# Patient Record
Sex: Female | Born: 1955 | Race: Asian | Hispanic: No | Marital: Married | Smoking: Never smoker
Health system: Southern US, Community
[De-identification: ages and names within clinical notes are randomized; demographics above are authoritative.]

## PROBLEM LIST (undated history)

## (undated) DIAGNOSIS — I82509 Chronic embolism and thrombosis of unspecified deep veins of unspecified lower extremity: Secondary | ICD-10-CM

## (undated) DIAGNOSIS — A159 Respiratory tuberculosis unspecified: Secondary | ICD-10-CM

## (undated) DIAGNOSIS — I209 Angina pectoris, unspecified: Secondary | ICD-10-CM

## (undated) DIAGNOSIS — G8929 Other chronic pain: Secondary | ICD-10-CM

## (undated) DIAGNOSIS — R0602 Shortness of breath: Secondary | ICD-10-CM

## (undated) DIAGNOSIS — I10 Essential (primary) hypertension: Secondary | ICD-10-CM

---

## 2007-10-29 ENCOUNTER — Ambulatory Visit: Payer: Self-pay | Admitting: Nurse Practitioner

## 2007-10-29 DIAGNOSIS — N926 Irregular menstruation, unspecified: Secondary | ICD-10-CM

## 2007-10-29 DIAGNOSIS — G43909 Migraine, unspecified, not intractable, without status migrainosus: Secondary | ICD-10-CM | POA: Insufficient documentation

## 2007-10-29 DIAGNOSIS — M545 Low back pain: Secondary | ICD-10-CM

## 2008-02-17 ENCOUNTER — Emergency Department (HOSPITAL_COMMUNITY): Admission: EM | Admit: 2008-02-17 | Discharge: 2008-02-17 | Payer: Self-pay | Admitting: Emergency Medicine

## 2008-04-28 ENCOUNTER — Emergency Department (HOSPITAL_COMMUNITY): Admission: EM | Admit: 2008-04-28 | Discharge: 2008-04-28 | Payer: Self-pay | Admitting: Emergency Medicine

## 2008-05-01 ENCOUNTER — Emergency Department (HOSPITAL_COMMUNITY): Admission: EM | Admit: 2008-05-01 | Discharge: 2008-05-01 | Payer: Self-pay | Admitting: Family Medicine

## 2008-08-22 ENCOUNTER — Ambulatory Visit: Payer: Self-pay | Admitting: Nurse Practitioner

## 2008-08-22 DIAGNOSIS — I10 Essential (primary) hypertension: Secondary | ICD-10-CM | POA: Insufficient documentation

## 2008-08-22 DIAGNOSIS — R3129 Other microscopic hematuria: Secondary | ICD-10-CM

## 2008-08-22 LAB — CONVERTED CEMR LAB
ALT: 18 units/L (ref 0–35)
AST: 18 units/L (ref 0–37)
Albumin: 4.1 g/dL (ref 3.5–5.2)
Alkaline Phosphatase: 80 units/L (ref 39–117)
BUN: 11 mg/dL (ref 6–23)
Basophils Absolute: 0 10*3/uL (ref 0.0–0.1)
Basophils Relative: 1 % (ref 0–1)
CO2: 23 meq/L (ref 19–32)
Calcium: 9.1 mg/dL (ref 8.4–10.5)
Chloride: 102 meq/L (ref 96–112)
Creatinine, Ser: 0.56 mg/dL (ref 0.40–1.20)
Eosinophils Absolute: 0.1 10*3/uL (ref 0.0–0.7)
Eosinophils Relative: 1 % (ref 0–5)
Glucose, Bld: 81 mg/dL (ref 70–99)
HCT: 42 % (ref 36.0–46.0)
Hemoglobin: 13.4 g/dL (ref 12.0–15.0)
Lymphocytes Relative: 33 % (ref 12–46)
Lymphs Abs: 1.4 10*3/uL (ref 0.7–4.0)
MCHC: 31.9 g/dL (ref 30.0–36.0)
MCV: 91.1 fL (ref 78.0–100.0)
Monocytes Absolute: 0.3 10*3/uL (ref 0.1–1.0)
Monocytes Relative: 7 % (ref 3–12)
Neutro Abs: 2.5 10*3/uL (ref 1.7–7.7)
Neutrophils Relative %: 58 % (ref 43–77)
Platelets: 263 10*3/uL (ref 150–400)
Potassium: 4.5 meq/L (ref 3.5–5.3)
RBC: 4.61 M/uL (ref 3.87–5.11)
RDW: 13.3 % (ref 11.5–15.5)
Sodium: 137 meq/L (ref 135–145)
Total Bilirubin: 0.7 mg/dL (ref 0.3–1.2)
Total Protein: 7.8 g/dL (ref 6.0–8.3)
WBC: 4.3 10*3/uL (ref 4.0–10.5)

## 2008-08-23 ENCOUNTER — Encounter (INDEPENDENT_AMBULATORY_CARE_PROVIDER_SITE_OTHER): Payer: Self-pay | Admitting: Nurse Practitioner

## 2008-09-22 ENCOUNTER — Ambulatory Visit: Payer: Self-pay | Admitting: Nurse Practitioner

## 2008-11-03 DIAGNOSIS — I82409 Acute embolism and thrombosis of unspecified deep veins of unspecified lower extremity: Secondary | ICD-10-CM | POA: Insufficient documentation

## 2008-11-09 ENCOUNTER — Encounter (INDEPENDENT_AMBULATORY_CARE_PROVIDER_SITE_OTHER): Payer: Self-pay | Admitting: Nurse Practitioner

## 2008-11-13 ENCOUNTER — Emergency Department (HOSPITAL_COMMUNITY): Admission: EM | Admit: 2008-11-13 | Discharge: 2008-11-13 | Payer: Self-pay | Admitting: Emergency Medicine

## 2008-11-13 ENCOUNTER — Ambulatory Visit: Payer: Self-pay | Admitting: Nurse Practitioner

## 2008-11-13 LAB — CONVERTED CEMR LAB
AST: 30 units/L
Albumin: 3.2 g/dL
Alkaline Phosphatase: 88 units/L
CO2: 25 meq/L
Chloride: 97 meq/L
Eosinophils Absolute: 0.1 10*3/uL
Eosinophils Relative: 1 %
Glucose, Bld: 110 mg/dL
HCT: 34.2 %
INR: 4.7
INR: 4.8 — ABNORMAL HIGH (ref 0.0–1.5)
Lymphocytes Relative: 10 %
Lymphs Abs: 1 10*3/uL
MCV: 88.4 fL
Monocytes Relative: 12 %
Neutrophils Relative %: 77 %
Platelets: 292 10*3/uL
RBC: 3.86 M/uL
Sodium: 131 meq/L
Total Protein: 7.6 g/dL
WBC: 9.8 10*3/uL

## 2008-11-15 ENCOUNTER — Ambulatory Visit: Payer: Self-pay | Admitting: Nurse Practitioner

## 2008-11-22 ENCOUNTER — Ambulatory Visit: Payer: Self-pay | Admitting: Nurse Practitioner

## 2008-11-22 LAB — CONVERTED CEMR LAB: Prothrombin Time: 37.8 s — ABNORMAL HIGH (ref 11.6–15.2)

## 2008-11-24 ENCOUNTER — Ambulatory Visit: Payer: Self-pay | Admitting: Nurse Practitioner

## 2008-12-01 ENCOUNTER — Ambulatory Visit: Payer: Self-pay | Admitting: Nurse Practitioner

## 2008-12-04 ENCOUNTER — Ambulatory Visit: Payer: Self-pay | Admitting: Nurse Practitioner

## 2008-12-12 ENCOUNTER — Encounter (INDEPENDENT_AMBULATORY_CARE_PROVIDER_SITE_OTHER): Payer: Self-pay | Admitting: Nurse Practitioner

## 2008-12-20 ENCOUNTER — Telehealth (INDEPENDENT_AMBULATORY_CARE_PROVIDER_SITE_OTHER): Payer: Self-pay | Admitting: *Deleted

## 2008-12-29 ENCOUNTER — Ambulatory Visit: Payer: Self-pay | Admitting: Nurse Practitioner

## 2009-01-01 ENCOUNTER — Encounter: Payer: Self-pay | Admitting: Nurse Practitioner

## 2009-01-01 LAB — CONVERTED CEMR LAB: INR: 1.5 (ref 0.0–1.5)

## 2009-01-18 ENCOUNTER — Encounter (INDEPENDENT_AMBULATORY_CARE_PROVIDER_SITE_OTHER): Payer: Self-pay | Admitting: Nurse Practitioner

## 2009-11-27 ENCOUNTER — Encounter (INDEPENDENT_AMBULATORY_CARE_PROVIDER_SITE_OTHER): Payer: Self-pay | Admitting: Nurse Practitioner

## 2009-12-25 LAB — CONVERTED CEMR LAB

## 2010-02-07 ENCOUNTER — Encounter (INDEPENDENT_AMBULATORY_CARE_PROVIDER_SITE_OTHER): Payer: Self-pay | Admitting: Nurse Practitioner

## 2010-02-25 ENCOUNTER — Emergency Department (HOSPITAL_COMMUNITY): Admission: EM | Admit: 2010-02-25 | Discharge: 2010-02-25 | Payer: Self-pay | Admitting: Family Medicine

## 2010-04-03 ENCOUNTER — Ambulatory Visit: Payer: Self-pay | Admitting: Nurse Practitioner

## 2010-04-04 ENCOUNTER — Encounter (INDEPENDENT_AMBULATORY_CARE_PROVIDER_SITE_OTHER): Payer: Self-pay | Admitting: Nurse Practitioner

## 2010-04-04 LAB — CONVERTED CEMR LAB
AST: 27 units/L (ref 0–37)
Albumin: 4.2 g/dL (ref 3.5–5.2)
BUN: 10 mg/dL (ref 6–23)
CO2: 22 meq/L (ref 19–32)
Calcium: 8.9 mg/dL (ref 8.4–10.5)
Chloride: 104 meq/L (ref 96–112)
Cholesterol: 175 mg/dL (ref 0–200)
Eosinophils Relative: 1 % (ref 0–5)
HCT: 38.6 % (ref 36.0–46.0)
HDL: 56 mg/dL (ref >39)
Hemoglobin: 12.4 g/dL (ref 12.0–15.0)
Lymphocytes Relative: 26 % (ref 12–46)
Lymphs Abs: 1.3 10*3/uL (ref 0.7–4.0)
MCV: 87.7 fL (ref 78.0–100.0)
Monocytes Absolute: 0.4 10*3/uL (ref 0.1–1.0)
Monocytes Relative: 7 % (ref 3–12)
Platelets: 196 10*3/uL (ref 150–400)
Potassium: 4.2 meq/L (ref 3.5–5.3)
Prothrombin Time: 48.9 s — ABNORMAL HIGH (ref 11.6–15.2)
RBC: 4.4 M/uL (ref 3.87–5.11)
TSH: 0.365 microintl units/mL (ref 0.350–4.500)
WBC: 5 10*3/uL (ref 4.0–10.5)

## 2010-04-10 ENCOUNTER — Encounter: Payer: Self-pay | Admitting: Nurse Practitioner

## 2010-04-10 ENCOUNTER — Encounter (INDEPENDENT_AMBULATORY_CARE_PROVIDER_SITE_OTHER): Payer: Self-pay | Admitting: Nurse Practitioner

## 2010-04-10 ENCOUNTER — Ambulatory Visit: Payer: Self-pay | Admitting: Nurse Practitioner

## 2010-04-10 LAB — CONVERTED CEMR LAB: INR: 2.01 — ABNORMAL HIGH (ref <1.50)

## 2010-04-19 ENCOUNTER — Encounter (INDEPENDENT_AMBULATORY_CARE_PROVIDER_SITE_OTHER): Payer: Self-pay | Admitting: Nurse Practitioner

## 2010-04-24 ENCOUNTER — Ambulatory Visit: Payer: Self-pay | Admitting: Nurse Practitioner

## 2010-04-25 LAB — CONVERTED CEMR LAB: Prothrombin Time: 28.3 s — ABNORMAL HIGH (ref 11.6–15.2)

## 2010-05-21 ENCOUNTER — Ambulatory Visit: Payer: Self-pay | Admitting: Nurse Practitioner

## 2010-05-22 ENCOUNTER — Ambulatory Visit: Payer: Self-pay | Admitting: Nurse Practitioner

## 2010-05-22 LAB — CONVERTED CEMR LAB: INR: 2.86 — ABNORMAL HIGH (ref <1.50)

## 2010-05-23 IMAGING — CT CT ANGIO CHEST
2 of 6 series · 18 of 36 positions shown · IV contrast (100 ML OMNI 300)
Comparison: None

CLINICAL DATA: Left lower extremity swelling and redness,
intermittent chest pain, shortness of breath, and dyspnea, elevated
D-dimer, history hypertension, DVT, tuberculosis

CT ANGIOGRAPHY CHEST
TECHNIQUE: Multidetector CT imaging of the chest using the
standard protocol during bolus administration of intravenous
contrast. Multiplanar reconstructed images including MIPs were
obtained and reviewed to evaluate the vascular anatomy. Scattered
respiratory motion artifacts are present, patient unable to follow
instructions due to the language barrier.
Contrast: 100 ml [id number]

[Series 2: pe · axial · 0.81mm/px · z∈[-195,-8]mm · 17 of 169 slices shown]
[im 10/169  lung]
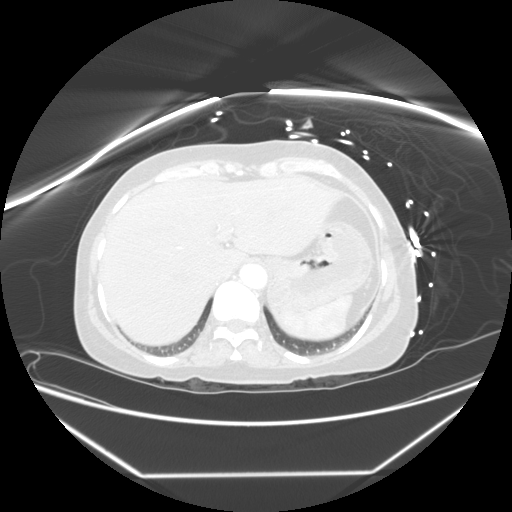
[im 19/169  mediastinal]
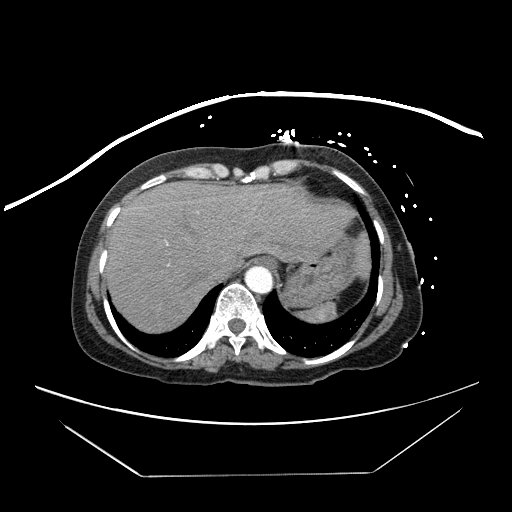
[im 29/169  lung]
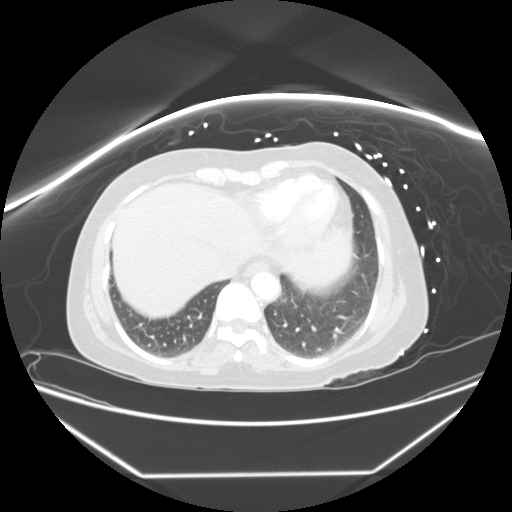
[im 38/169  mediastinal]
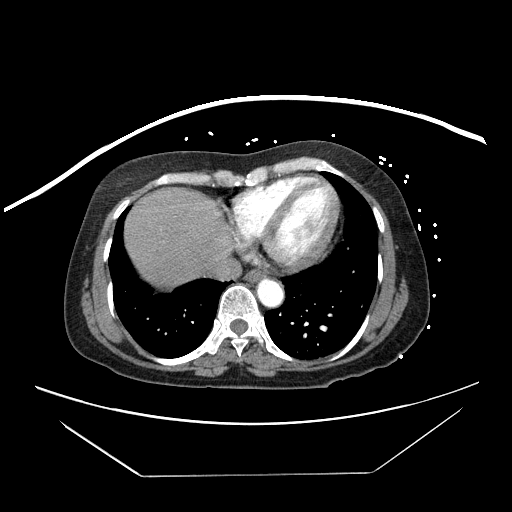
[im 47/169  lung]
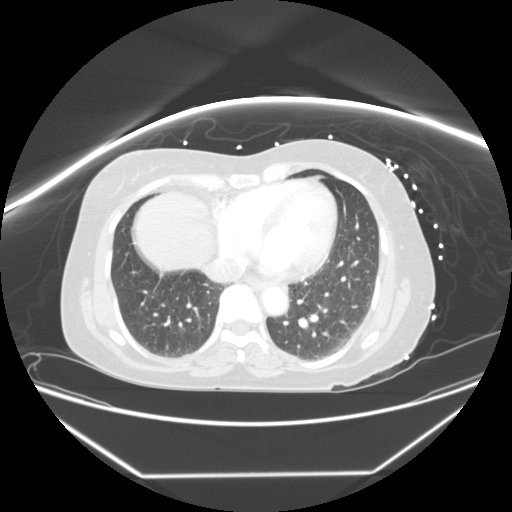
[im 57/169  mediastinal]
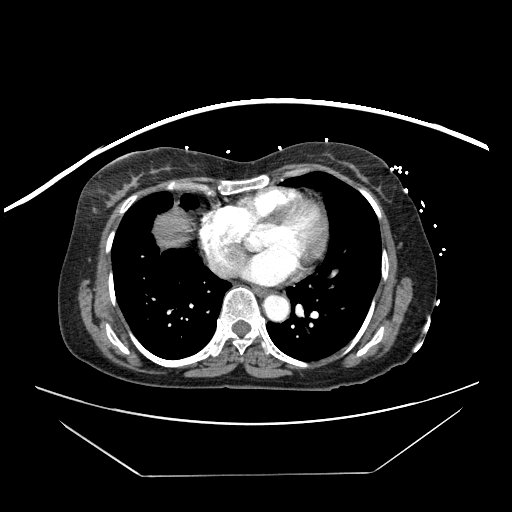
[im 66/169  lung]
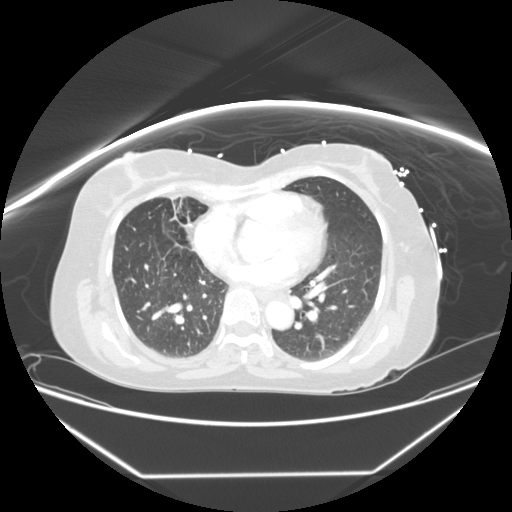
[im 75/169  mediastinal]
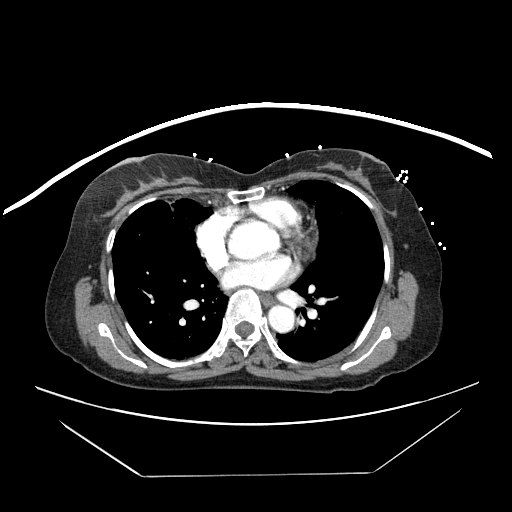
[im 85/169  lung]
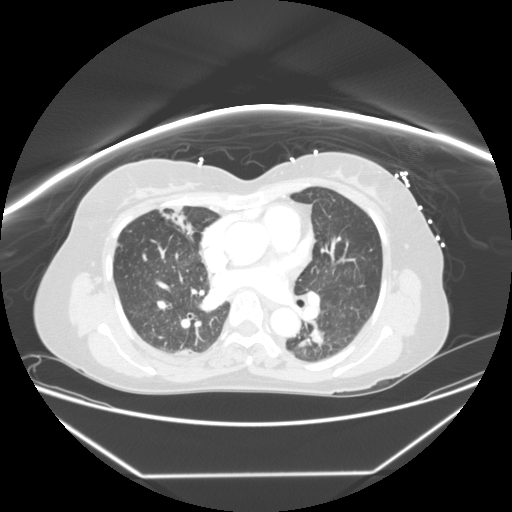
[im 94/169  mediastinal]
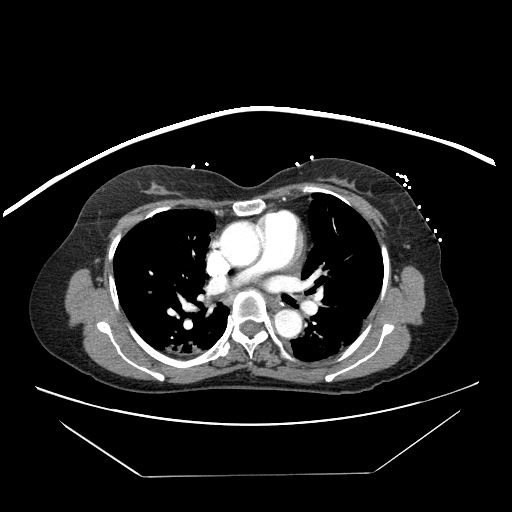
[im 103/169  lung]
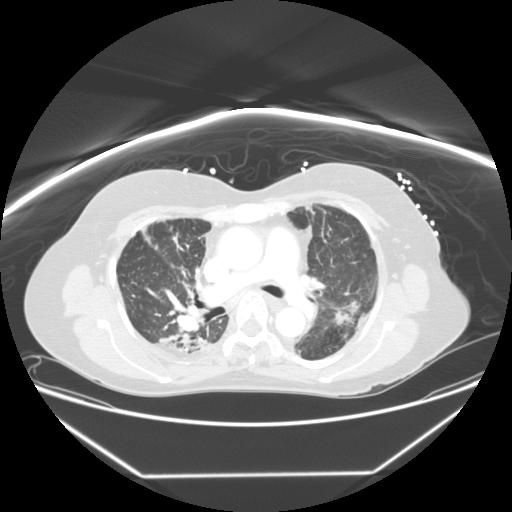
[im 113/169  mediastinal]
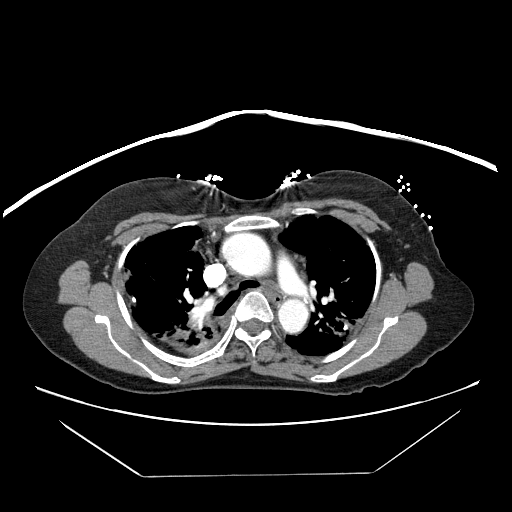
[im 122/169  lung]
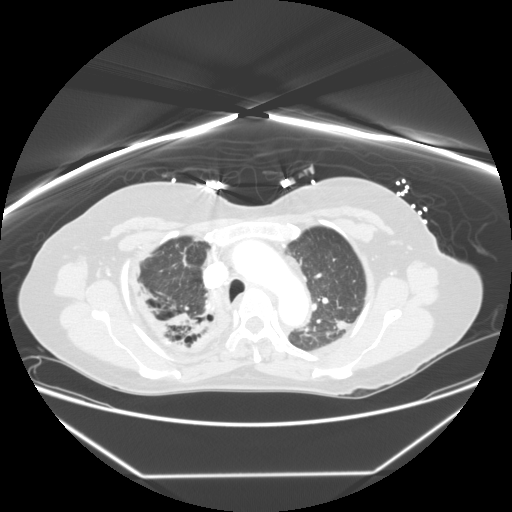
[im 131/169  mediastinal]
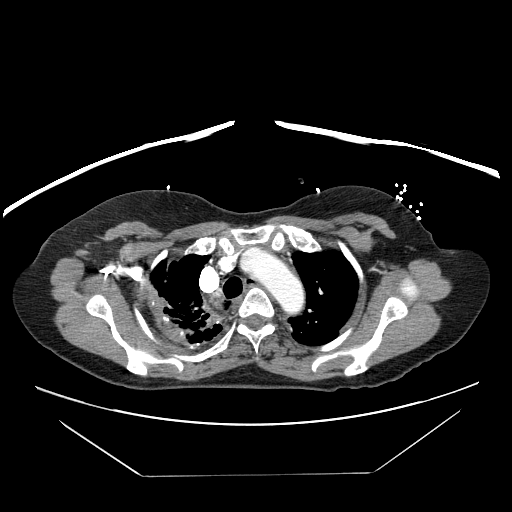
[im 141/169  lung]
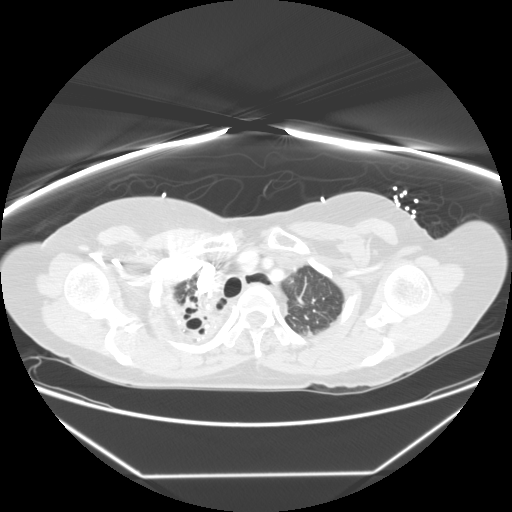
[im 150/169  mediastinal]
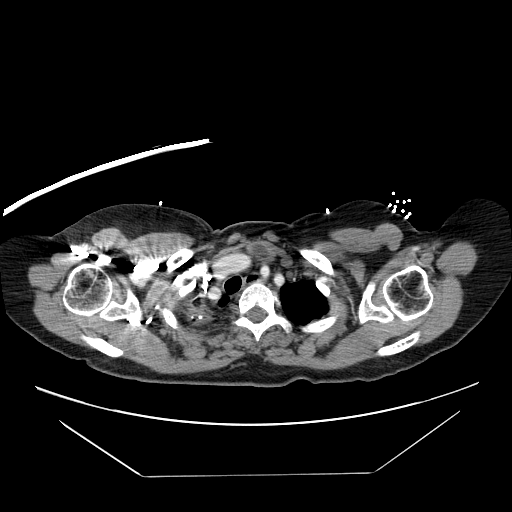
[im 159/169  lung]
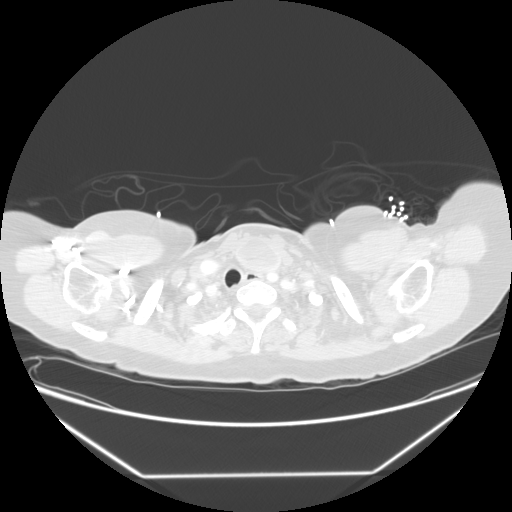

[Series 203: coronal · coronal · 0.81mm/px · 1 of 60 slices shown]
[im 30/60  mediastinal]
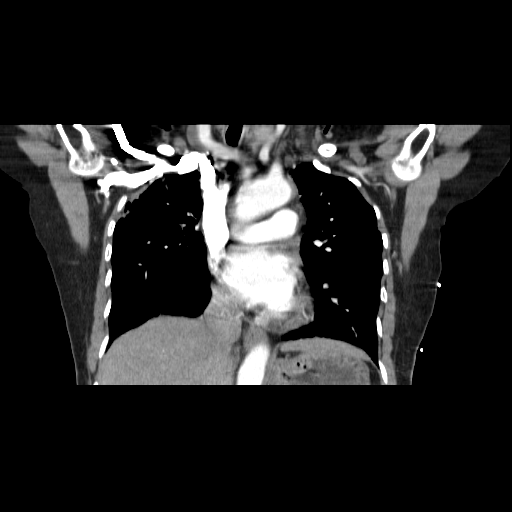

[18 of 36 positions shown; findings below may reference images not displayed]

FINDINGS: Low attenuation question cystic left thyroid mass, 3.6 x 2.7 by at
least 2.8 cm diameter, with mediastinal shift to the right.
Aorta normal caliber without aneurysm or dissection.
Pulmonary arteries patent.
No evidence of pulmonary embolism.
Numerous calcified right upper lobe pulmonary granulomata as well
as calcified hilar and mediastinal nodes compatible with given
history of tuberculosis.
Additional calcified granulomas identified within left upper lobe.
Visualized portion of upper abdomen unremarkable.
Right upper lobe volume loss with scattered cystic changes and
bronchiectasis.
Subpleural soft tissue masses in bilateral upper lobes, many of
which are calcified, suspect related to granulomatous disease.
Noncalcified pulmonary nodule right upper lobe 5 mm diameter image
39.
Additional noncalcified nodular foci in left upper lobe, including
8 mm diameter nodule 36.
Minimal scattered interlobular septal thickening in the mid-to-
lower lungs.
No segmental consolidation, pleural effusion, or pneumothorax.
No focal bony abnormalities.
IMPRESSION: No evidence of pulmonary embolism.
Extensive granulomatous disease throughout primarily right upper
lobe but also seen in left upper lobe with calcified mediastinal
and hilar lymph nodes bilaterally, compatible with given history of
tuberculosis.
Scattered parenchymal opacities and nodular foci throughout both
lungs, may be related to granulomatous disease and scarring though
unable to exclude tumor at any of the noncalcified sites, recommend
either comparison to prior outside studies or follow-up imaging
stability.
Cystic left thyroid mass at least 3.6 cm diameter, recommend follow-
up non emergent thyroid sonography to characterize this lesion.

## 2010-06-19 ENCOUNTER — Ambulatory Visit: Payer: Self-pay | Admitting: Internal Medicine

## 2010-06-19 ENCOUNTER — Encounter (INDEPENDENT_AMBULATORY_CARE_PROVIDER_SITE_OTHER): Payer: Self-pay | Admitting: Nurse Practitioner

## 2010-06-21 ENCOUNTER — Telehealth (INDEPENDENT_AMBULATORY_CARE_PROVIDER_SITE_OTHER): Payer: Self-pay | Admitting: Nurse Practitioner

## 2010-06-21 ENCOUNTER — Ambulatory Visit: Payer: Self-pay | Admitting: Internal Medicine

## 2010-07-18 ENCOUNTER — Ambulatory Visit: Payer: Self-pay | Admitting: Nurse Practitioner

## 2010-07-19 ENCOUNTER — Ambulatory Visit: Payer: Self-pay | Admitting: Nurse Practitioner

## 2010-07-19 LAB — CONVERTED CEMR LAB: INR: 2.61 — ABNORMAL HIGH (ref <1.50)

## 2010-08-15 ENCOUNTER — Ambulatory Visit: Payer: Self-pay | Admitting: Nurse Practitioner

## 2010-08-19 ENCOUNTER — Ambulatory Visit: Payer: Self-pay | Admitting: Nurse Practitioner

## 2010-08-19 LAB — CONVERTED CEMR LAB: Prothrombin Time: 14.1 s (ref 11.6–15.2)

## 2010-09-20 ENCOUNTER — Ambulatory Visit
Admission: RE | Admit: 2010-09-20 | Discharge: 2010-09-20 | Payer: Self-pay | Source: Home / Self Care | Attending: Nurse Practitioner | Admitting: Nurse Practitioner

## 2010-09-20 ENCOUNTER — Encounter (INDEPENDENT_AMBULATORY_CARE_PROVIDER_SITE_OTHER): Payer: Self-pay | Admitting: Nurse Practitioner

## 2010-10-15 NOTE — Assessment & Plan Note (Signed)
Summary: Lab results  Nurse Visit   Physical Exam  Extremities:  No warmth, redness or edema noted to either lower extremity.   History of Present Illness: States pain is better, but still has a little.  No heat or swelling noted to left leg.     Patient Instructions: 1)  Your labs are ok today. 2)  No need to restart meds (coumadin) 3)  Take your blood pressure medications as ordered 4)  Follow up in 1 month for D-Dimer.  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 if still negative she can stay off coumadin  Complete Medication List: 1)  Lisinopril 5 Mg Tabs (Lisinopril) .Marland Kitchen.. 1 tablet by mouth daily for blood pressure 2)  Ultram 50 Mg Tabs (Tramadol hcl) .Marland Kitchen.. 1 tablet by mouth two times a day as needed for pain 3)  Coumadin 5 Mg Tabs (Warfarin sodium) .... Hold   Allergies: No Known Drug Allergies  Orders Added: 1)  Est. Patient Level I [04540]

## 2010-10-15 NOTE — Assessment & Plan Note (Signed)
Summary: F/u for DVT   Vital Signs:  Patient profile:   55 year old female Weight:      126.4 pounds BMI:     24.17 Temp:     97.4 degrees F oral Pulse rate:   74 / minute Pulse rhythm:   regular Resp:     20 per minute BP sitting:   129 / 95  (left arm) Cuff size:   regular  Vitals Entered By: Levon Hedger (April 10, 2010 10:01 AM) CC: follow-up visit 1 week, Hypertension Management Is Patient Diabetic? No  Does patient need assistance? Functional Status Self care Ambulation Normal   CC:  follow-up visit 1 week and Hypertension Management.  History of Present Illness:  Pt into the office for 1 week f/u. She came back into on 04/05/2010 and labs reviewed with pt. She also brought her medications bottles into the office. She was taking coumadin 5mg  with alternating doses of 1 tablet and 1 1/2 tablet. INR >5 on last week and pt was instructed on the following 04/05/2010 - none 04/06/2010 - 5mg  04/07/2010 - 5mg  04/08/2010 - 5mg  04/09/2010 - 5mg   Pt presents today with her son who is interpreting for her. Once again pt did NOT bring her medication bottles with her.  Advised her to bring all medications with her to each visit. Some records faxed from [place] which shows that she had labs done to check for hereditary DVT and all negative. 11/03/2008 - CT angiogram of abdominal aorta and bilateral iliofemoral 1. Probably scarring in the anterior aspect of the right lung base 2.  Good runoff bilaterally to calf.  There is poor visualization of the trifurcations below this.  This may represent a limitation of this technique. 3.  Fluid in the endometrial cavity likely related to the pt's menstrual cycle 4.  Enlargment of the lower portion of the left rectus abdominis muscle to 6.3cm x 3.8 cm.  This may represent a rectus hematoma 5.  Thrombosis of the left iliac vein.  Anticoagulation Management History:      She is being anticoagulated due to the first episode of deep venous  thrombosis and/or pulmonary embolism.  Negative risk factors for bleeding include an age less than 35 years old, no history of CVA/TIA, no history of GI bleeding, and absence of serious comorbidities.  The bleeding index is 'low risk'.  Positive CHADS2 values include History of HTN.  Negative CHADS2 values include Age > 67 years old, History of Diabetes, and Prior Stroke/CVA/TIA.  Anticipated length of treatment is 3-6 months.  Her last INR was 5.40.    Hypertension History:      She denies headache, chest pain, and palpitations.  She notes no problems with any antihypertensive medication side effects.        Positive major cardiovascular risk factors include hypertension.  Negative major cardiovascular risk factors include female age less than 25 years old, no history of diabetes, and non-tobacco-user status.        Further assessment for target organ damage reveals no history of ASHD, cardiac end-organ damage (CHF/LVH), stroke/TIA, peripheral vascular disease, renal insufficiency, or hypertensive retinopathy.      Allergies: No Known Drug Allergies  Review of Systems CV:  Denies chest pain or discomfort. Resp:  Denies cough. GI:  Denies abdominal pain, nausea, and vomiting. MS:  Denies joint pain.  Physical Exam  General:  alert.   Ears:  ear piercing(s) noted.   Nose:  nasal piercing Lungs:  normal breath  sounds.   Heart:  normal rate and regular rhythm.   Abdomen:  normal bowel sounds.   Msk:  normal ROM.   Neurologic:  alert & oriented X3.   Skin:  color normal.   Psych:  Oriented X3.     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 1 tablet by mouth daily for blood pressure  Complete Medication List: 1)  Lisinopril 5 Mg Tabs (Lisinopril) .Marland Kitchen.. 1 tablet by mouth daily for blood pressure 2)  Ultram 50 Mg Tabs (Tramadol hcl) .Marland Kitchen.. 1 tablet by mouth two times a day as needed for pain 3)  Coumadin 5 Mg Tabs (Warfarin sodium) .... Sunday - 1 tab, monday - 1 tab, tuesday - 1 tab, wednesday - 1 tab, thursday - 1 tab, friday - 1 tab, saturday - 1 tab  Anticoagulation Management Assessment/Plan:      The patient's current anticoagulation dose is Coumadin 5 mg tabs: Sunday - 1 tab, Monday - 1 tab, Tuesday - 1 tab, Wednesday - 1 tab, Thursday - 1 tab, Friday - 1 tab, Saturday - 1 tab.  The target INR is 2.0-3.0.  The next INR is due PT/INR in 2 weeks.  Anticoagulation instructions were given to son.         Prior Anticoagulation Instructions: The patient's dosage of coumadin will be increased.  The new dosage includes: Repeat PT/INR in 2 weeks.    Hypertension Assessment/Plan:      The patient's hypertensive risk group is category A: No risk factors and no target organ damage.  Her calculated 10 year risk of coronary heart disease is 7 %.  Today's blood pressure is 129/95.  Her blood pressure goal is < 140/90.  Patient Instructions: 1)  This office will call you this afternoon with the lab results. 2)  Follow up in 2 weeks for lab visit - PT/INR 3)  May also need a refill on medication - coumadin and lisinopril

## 2010-10-15 NOTE — Letter (Signed)
Summary: REQUESTING RECORDS FROM [facility]  REQUESTING RECORDS FROM [facility]   Imported By: Arta Bruce 04/04/2010 12:59:37  _____________________________________________________________________  External Attachment:    Type:   Image     Comment:   External Document

## 2010-10-15 NOTE — Letter (Signed)
[facility]   Imported By: Arta Bruce 05/01/2010 12:53:37  _____________________________________________________________________  External Attachment:    Type:   Image     Comment:   External Document

## 2010-10-15 NOTE — Assessment & Plan Note (Signed)
Summary: F/u LLE DVT   Vital Signs:  Patient profile:   54 year old female Height:      60.75 inches Weight:      127.1 pounds BMI:     24.30 BSA:     1.55 Temp:     97.9 degrees F oral Pulse rate:   77 / minute Pulse rhythm:   regular Resp:     20 per minute BP sitting:   124 / 95  (left arm) Cuff size:   regular  Vitals Entered By: Levon Hedger (April 03, 2010 10:37 AM)  Serial Vital Signs/Assessments:  Time      Position  BP       Pulse  Resp  Temp     By 10:51 AM            140/96                         Levon Hedger  Comments: Blood pressure of 140/96 recorded in error By: Lehman Prom FNP   CC: follow-up visit for blood clot in leg, Hypertension Management Is Patient Diabetic? No Pain Assessment Patient in pain? yes     Location: leg  Does patient need assistance? Functional Status Self care Ambulation Normal   CC:  follow-up visit for blood clot in leg and Hypertension Management.  History of Present Illness:  Pt into the office for f/u  Last visit in this office March 2010 She has been going to [facility] (has today and letter indicating that she missed her last visit there on 03/11/2010) Pt was employed at [employer] and was getting medical care in [place].  Her insurance ended and she was not able to return to [place] - [phone] Pt at this time has an orange card and is applying for medicaid.  Left lower extremity DVT - Reports that she is taking coumadin but she does not have the bottle with pt today in office.  She is able to recite that she takes coumadin 1 1/2 tablets on Monday, Tuesday, Thursday and Saturday.  She takes 1 tablet on the other days. Again pt is unsure what dose of coumadin she is taking As best I can understand pt stopped the coumadin and then blood clot returned so she was restarted on coumadin.   Pt quit working at [employer] about 2 months ago and left leg has not been as painful and swollen.  Unable to  determine when pt restarted coumadin  Pt presents today with her son who serves as her interpreter No acute problems today  Hypertension History:      She denies headache, chest pain, and palpitations.  Pt is also taking blood pressure medications but she is unsure of the name and dose.        Positive major cardiovascular risk factors include hypertension.  Negative major cardiovascular risk factors include female age less than 25 years old and non-tobacco-user status.        Further assessment for target organ damage reveals no history of ASHD, cardiac end-organ damage (CHF/LVH), stroke/TIA, peripheral vascular disease, renal insufficiency, or hypertensive retinopathy.     Allergies: No Known Drug Allergies  Review of Systems General:  Denies fatigue. CV:  Denies chest pain or discomfort. Resp:  Denies cough and shortness of breath. GI:  Denies abdominal pain, nausea, and vomiting. MS:  left leg DVT - pt quit the job 2 months ago and leg pain has improved since  she quit working. swelling has decreased. Neuro:  Denies headaches.  Physical Exam  General:  alert.   Ears:  ear piercing(s) noted.   Nose:  nasal piercing Lungs:  normal breath sounds.   Heart:  normal rate and regular rhythm.   Msk:  up to the exam table Pulses:  left DP +2 negative homan's Extremities:  no edema Neurologic:  alert & oriented X3.   Skin:  color normal.   Psych:  Oriented X3.     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 1  tablet by mouth daily for blood pressure  Orders: T-Lipid Profile ([id number]) T-Comprehensive Metabolic Panel ([id number]) T-CBC w/Diff ([id number]) T-TSH ([id number])  Complete Medication List: 1)  Lisinopril 5 Mg Tabs (Lisinopril) .Marland Kitchen.. 1 tablet by mouth daily for blood pressure 2)  Warfarin Sodium 5 Mg Tabs (Warfarin sodium) .... One tablet by mouth daily 3)  Ultram 50 Mg Tabs (Tramadol hcl) .Marland Kitchen.. 1 tablet by mouth two times a day as needed for pain 4)  Coumadin 5 Mg Tabs (Warfarin sodium) .... Sunday - 1 tab, monday - 1 tab, tuesday - 1 tab, wednesday - 1 tab, thursday - 1 tab, friday - 1 tab, saturday - 1 tab  Hypertension Assessment/Plan:      The patient's hypertensive risk group is category A: No risk factors and no target organ damage.  Today's blood pressure is 124/95.  Her blood pressure goal is < 140/90.  Patient Instructions: 1)  Sign a release to get records from [facility] 2)  Provider Nils Flack 3)  Phone # [phone] 4)  Please put ASAP on this request so that they will send records quickly 5)  Follow up in 1 week with [email or web address],fnp  6)  I should have your records by then and can determine what treatment plan you need 7)  Will review lab results

## 2010-10-15 NOTE — Assessment & Plan Note (Signed)
Summary: F/u DVT/Headache   Vital Signs:  Patient profile:   55 year old female Menstrual status:  irregular LMP:     05/27/2010 Weight:      134.0 pounds BMI:     25.62 Temp:     97.0 degrees F oral Pulse rate:   72 / minute Pulse rhythm:   regular Resp:     16 per minute BP sitting:   106 / 70  (left arm) Cuff size:   regular  Vitals Entered By: Levon Hedger (July 19, 2010 11:16 AM)  Nutrition Counseling: Patient's BMI is greater than 25 and therefore counseled on weight management options. CC: follow-up visit...headache, left breast pain, Headache, Hypertension Management Pain Assessment Patient in pain? yes     Location: head, breast  Does patient need assistance? Functional Status Self care Ambulation Normal LMP (date): 05/27/2010 LMP - Character: light     Menstrual Status irregular Enter LMP: 05/27/2010 Last PAP Result historical per pt done at [facility]   CC:  follow-up visit...headache, left breast pain, Headache, and Hypertension Management.  History of Present Illness:  Pt into the office for results of labs done on yesterday.  Hx of Left lower DVT - pt was being followed at [facility]  Full work up reviewed   Son present today during exam  Anticoagulation Management History:      The patient is taking warfarin and comes in today for a routine follow up visit.  Warfarin therapy is being given due to the first episode of deep venous thrombosis and/or pulmonary embolism.  Negative risk factors for bleeding include an age less than 66 years old, no history of CVA/TIA, no history of GI bleeding, and absence of serious comorbidities.  The bleeding index is 'low risk'.  Positive CHADS2 values include History of HTN.  Negative CHADS2 values include Age > 38 years old, History of Diabetes, and Prior Stroke/CVA/TIA.  Anticipated length of treatment is 3-6 months.  Her last INR was 2.61.    Headache HPI:      She has approximately 3  headaches per month.  Headaches have been occurring since age 49.  There is a family history of migraine headaches.        On a scale of 1-10, she describes the headache as a 5.  The location of the headaches are bilateral.  Headache quality is pressure or tightness.  Precipitating factors consist of changes in weather.  The headaches are associated with vomiting and tearing of the eyes.        The patient denies seizures.         Headache Treatment History:      She has tried acetaminophen-plain which was effective.    Hypertension History:      She denies headache, chest pain, and palpitations.  She notes no problems with any antihypertensive medication side effects.        Positive major cardiovascular risk factors include hypertension.  Negative major cardiovascular risk factors include female age less than 38 years old, no history of diabetes, and non-tobacco-user status.        Further assessment for target organ damage reveals no history of ASHD, cardiac end-organ damage (CHF/LVH), stroke/TIA, peripheral vascular disease, renal insufficiency, or hypertensive retinopathy.         Medications Prior to Update: 1)  Lisinopril 5 Mg Tabs (Lisinopril) .Marland Kitchen.. 1 Tablet By Mouth Daily For Blood Pressure 2)  Ultram 50 Mg Tabs (Tramadol Hcl) .Marland Kitchen.. 1 Tablet  By Mouth Two Times A Day As Needed For Pain 3)  Coumadin 5 Mg Tabs (Warfarin Sodium) .... Sunday - 1 Tab, Monday - 1 Tab, Tuesday - 1 Tab, Wednesday - 1 Tab, Thursday - 1 Tab, Friday - 1 Tab, Saturday - 1 Tab  Current Medications (verified): 1)  Lisinopril 5 Mg Tabs (Lisinopril) .Marland Kitchen.. 1 Tablet By Mouth Daily For Blood Pressure 2)  Ultram 50 Mg Tabs (Tramadol Hcl) .Marland Kitchen.. 1 Tablet By Mouth Two Times A Day As Needed For Pain 3)  Coumadin 5 Mg Tabs (Warfarin Sodium) .... Sunday - 1 Tab, Monday - 1 Tab, Tuesday - 1 Tab, Wednesday - 1 Tab, Thursday - 1 Tab, Friday - 1 Tab, Saturday - 1 Tab  Allergies (verified): No Known Drug Allergies  Review of  Systems CV:  Denies chest pain or discomfort. Resp:  Denies cough. GI:  Denies abdominal pain, nausea, and vomiting. Neuro:  Complains of headaches; HA mostly when she has to take care of the grandkids and has to talk to them constantly.  No headaches when kids are not present.  Physical Exam  General:  alert.   Head:  normocephalic.   Nose:  nasal piercing Mouth:  fair dentition.   Neck:  supple.   Lungs:  normal breath sounds.   Heart:  normal rate and regular rhythm.   Abdomen:  normal bowel sounds.   Neurologic:  alert & oriented X3.     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 DASH diet Her updated medication list for this problem includes:    Lisinopril 5 Mg Tabs (Lisinopril) .Marland Kitchen... 1 tablet by mouth daily for blood pressure  Problem # 3:  MIGRAINE HEADACHE (ICD-346.90) most likely due to tension from keeping grandkids advised pt to limit contact if she can Her updated medication list for this problem includes:    Ultram 50 Mg Tabs (Tramadol hcl) .Marland Kitchen... 1 tablet by mouth two times a day as needed for pain  Problem # 4:  NEED PROPHYLACTIC VACCINATION&INOCULATION FLU (ICD-V04.81) given in office  Complete Medication List: 1)  Lisinopril 5 Mg Tabs (Lisinopril) .Marland Kitchen.. 1 tablet by mouth daily for blood pressure 2)  Ultram 50 Mg Tabs (Tramadol hcl) .Marland Kitchen.. 1 tablet by mouth two times a day as needed for pain 3)  Coumadin 5 Mg Tabs (Warfarin sodium) .... Hold  Other Orders: Flu Vaccine 76yrs + (10272) Admin 1st Vaccine (53664)  Anticoagulation Management Assessment/Plan:      The target INR is 2.0-3.0.  The next INR is due 07/18/2010.  Anticoagulation instructions were given to son.         Current Anticoagulation Instructions: The patient is to stop coumadin.     Hypertension Assessment/Plan:      The patient's hypertensive risk group is category A: No risk factors and no target organ damage.  Her calculated 10 year risk of coronary heart disease is 3 %.  Today's blood pressure is 106/70.  Her blood pressure goal is < 140/90.   Patient Instructions: 1)  Follow up in 4 weeks for labs - PT/INR and D-dimer 2)  Follow up the day after labs with Triage nurse for review of labs. 3)  May take tylenol as needed for headache 4)  STOP taking the coumadin 5)  Continue taking lisinopril 5mg   by mouth daily 6)  You have been given the flu vaccine today   Orders Added: 1)  Flu Vaccine 88yrs + [90658] 2)  Admin 1st Vaccine [90471] 3)  Est. Patient Level III [71062]   Immunizations Administered:  Influenza Vaccine # 1:    Vaccine Type: Fluvax 3+    Site: left deltoid    Mfr: GlaxoSmithKline    Dose: 0.5 ml    Route: IM    Given by: Levon Hedger    Exp. Date: 03/15/2011    Lot #: [id number]    VIS given: 04/09/10 version given July 19, 2010.  Flu Vaccine Consent Questions:    Do you have a history of severe allergic reactions to this vaccine? no    Any prior history of allergic reactions to egg and/or gelatin? no    Do you have a sensitivity to the preservative Thimersol? no    Do you have a past history of Guillan-Barre Syndrome? no    Do you currently have an acute febrile illness? no    Have you ever had a severe reaction to latex? no    Vaccine information given and explained to patient? yes    Are you currently pregnant? no    ndc  [phone]  Immunizations Administered:  Influenza Vaccine # 1:    Vaccine Type: Fluvax 3+    Site: left deltoid    Mfr: GlaxoSmithKline    Dose: 0.5 ml    Route: IM    Given by: Levon Hedger    Exp. Date: 03/15/2011    Lot #: [id number]    VIS given: 04/09/10 version given July 19, 2010.  Prevention & Chronic Care Immunizations   Influenza vaccine: Fluvax 3+  (07/19/2010)    Tetanus  booster: Not documented    Pneumococcal vaccine: Not documented  Colorectal Screening   Hemoccult: Not documented    Colonoscopy: Not documented  Other Screening   Pap smear: historical per pt done at [facility]  (12/25/2009)    Mammogram: historical per pt  (12/25/2009)   Smoking status: never  (10/29/2007)  Lipids   Total Cholesterol: 175  (04/03/2010)   LDL: 98  (04/03/2010)   LDL Direct: Not documented   HDL: 56  (04/03/2010)   Triglycerides: 107  (04/03/2010)  Hypertension   Last Blood Pressure: 106 / 70  (07/19/2010)   Serum creatinine: 0.55  (04/03/2010)   Serum potassium 4.2  (04/03/2010)  Self-Management Support :    Hypertension self-management support: Not documented   Nursing Instructions: Give Flu vaccine today

## 2010-10-15 NOTE — Assessment & Plan Note (Signed)
Summary: No eligibility  Medicaid is no longer active.  today's appt initiated by pt. No acute problem just no visit in over 1 year pt last seen here 12/2008. Front desk staff advised to get pt information on eligibility and how to obtain an orange care.  Allergies: No Known Drug Allergies   Complete Medication List: 1)  Lisinopril 5 Mg Tabs (Lisinopril) .Marland Kitchen.. 1 tablet by mouth daily for blood pressure 2)  Warfarin Sodium 5 Mg Tabs (Warfarin sodium) .... One tablet by mouth daily 3)  Ultram 50 Mg Tabs (Tramadol hcl) .Marland Kitchen.. 1 tablet by mouth two times a day as needed for pain 4)  Coumadin 5 Mg Tabs (Warfarin sodium) .... Sunday - 1 tab, monday - 1 tab, tuesday - 1 tab, wednesday - 1 tab, thursday - 1 tab, friday - 1 tab, saturday - 1 tab

## 2010-10-15 NOTE — Progress Notes (Signed)
Summary: Needs coumadin refill  Phone Note Outgoing Call   Summary of Call: Pt. needs refill of coumadin. Initial call taken by: Dutch Quint RN,  June 21, 2010 9:17 AM  Follow-up for Phone Call        rx sent to [facility] [address].  Looks like pt's lab appt was scheduled on Nov 3rd. make sure that pt has an appt with me on the next day - Nov 4th to discuss lab results and HTN. Need to do it this way due to language barrier.  her son is not always with her and she doesn't understand english  Follow-up by: Lehman Prom FNP,  June 21, 2010 9:52 AM    Prescriptions: COUMADIN 5 MG TABS (WARFARIN SODIUM) Sunday - 1 tab, Monday - 1 tab, Tuesday - 1 tab, Wednesday - 1 tab, Thursday - 1 tab, Friday - 1 tab, Saturday - 1 tab  #30 x 0   Entered and Authorized by:   Nykedtra Martin FNP   Signed by:   Nykedtra Martin FNP on 06/21/2010   Method used:   Electronically to        [facility] [address] [id number] (retail)       [address]       [place], [place]  [address]       Ph: [phone]       Fax: [phone]   RxID:   [id number]   Appended Document: Needs coumadin refill Called pt. -- son not home.  Appt. for 07/19/10 at 10 am.  Dutch Quint RN  June 21, 2010 10:32 AM

## 2010-10-15 NOTE — Letter (Signed)
[facility]   Imported By: Arta Bruce 05/01/2010 12:57:09  _____________________________________________________________________  External Attachment:    Type:   Image     Comment:   External Document

## 2010-10-15 NOTE — Assessment & Plan Note (Signed)
Summary: FU UP FOR BP AND COUMADIN//KT  Nurse Visit   Vital Signs:  Patient profile:   55 year old female Pulse rate:   80 / minute Pulse rhythm:   regular Resp:     20 per minute BP sitting:   140 / 98  (right arm) Cuff size:   regular  Vitals Entered By: Dutch Quint RN (May 22, 2010 9:48 AM)  CC:  Lab results and BP/pulse check.  History of Present Illness: States that she is taking her meds every day -- also takes Lisinopril at night, did not take this morning.   Review of Systems CV:  Complains of chest pain or discomfort, fatigue, and palpitations; denies difficulty breathing at night, difficulty breathing while lying down, leg cramps with exertion, lightheadness, near fainting, swelling of feet, and swelling of hands; States CP goes across both breasts, aching "bone" pain.  Feels like her heart is beating fast for the past two-three days.  Occasional SOB with exertion. Denies cough..   Impression & Recommendations:  Problem # 1:  HYPERTENSION, BENIGN ESSENTIAL (ICD-401.1)  Her updated medication list for this problem includes:    Lisinopril 5 Mg Tabs (Lisinopril) .Marland Kitchen... 1 tablet by mouth daily for blood pressure  Complete Medication List: 1)  Lisinopril 5 Mg Tabs (Lisinopril) .Marland Kitchen.. 1 tablet by mouth daily for blood pressure 2)  Ultram 50 Mg Tabs (Tramadol hcl) .Marland Kitchen.. 1 tablet by mouth two times a day as needed for pain 3)  Coumadin 5 Mg Tabs (Warfarin sodium) .... Sunday - 1 tab, monday - 1 tab, tuesday - 1 tab, wednesday - 1 tab, thursday - 1 tab, friday - 1 tab, saturday - 1 tab   Patient Instructions: 1)  Reviewed with N. Martin 2)  Your blood pressure was still a little high today - 140/98.  Your goal is less than 140/90. 3)  Take your Lisinopril in the morning and the Coumadin at night. 4)  Your labwork from yesterday was OK -- continue taking the same dose of Coumadin. 5)  You have received refills for both your Coumadin and Lisinopril. Make sure you take  your medications as ordered until your next visit. 6)  Schedule a lab visit for 4 weeks, then a triage visit with Theresa for lab results two days later.  CC: Lab results and BP/pulse check   Serial Vital Signs/Assessments:  Time      Position  BP       Pulse  Resp  Temp     By 9:23 AM   L Arm     13 8/102                        Dutch Quint RN   Allergies: No Known Drug Allergies  Orders Added: 1)  Est. Patient Level I [45409] 2)  Est. Patient Level II [81191] Prescriptions: LISINOPRIL 5 MG TABS (LISINOPRIL) 1 tablet by mouth daily for blood pressure  #30 x 4   Entered by:   Dutch Quint RN   Authorized by:   Lehman Prom FNP   Signed by:   Dutch Quint RN on 05/22/2010   Method used:   Print then Give to Patient   RxID:   [id number] COUMADIN 5 MG TABS (WARFARIN SODIUM) Sunday - 1 tab, Monday - 1 tab, Tuesday - 1 tab, Wednesday - 1 tab, Thursday - 1 tab, Friday - 1 tab, Saturday - 1 tab  #30 x 0   Entered  by:   Dutch Quint RN   Authorized by:   Lehman Prom FNP   Signed by:   Dutch Quint RN on 05/22/2010   Method used:   Print then Give to Patient   RxID:   [id number]

## 2010-10-15 NOTE — Letter (Signed)
Summary: [facility]  [facility]   Imported By: Arta Bruce 05/01/2010 14:13:29  _____________________________________________________________________  External Attachment:    Type:   Image     Comment:   External Document

## 2010-10-15 NOTE — Assessment & Plan Note (Signed)
Summary: F/U LAB RESULTS/LR  Nurse Visit  Advised pt. and son of lab results and coumadin protocol.  Verbalized understanding.  Appt. for next INR 07/18/10.  Dutch Quint RN  June 21, 2010 9:17 AM     Allergies: No Known Drug Allergies  Appended Document: F/U LAB RESULTS/LR    Nurse Visit   Allergies: No Known Drug Allergies  Orders Added: 1)  Est. Patient Level I [[address]]

## 2010-10-15 NOTE — Letter (Signed)
Summary: RECEIVED RECORDS FROM [facility] RECORDS FROM [facility]   Imported By: Arta Bruce 04/10/2010 14:26:33  _____________________________________________________________________  External Attachment:    Type:   Image     Comment:   External Document

## 2010-10-17 NOTE — Assessment & Plan Note (Signed)
Summary: Lab visit  Nurse Visit   Vital Signs:  Patient profile:   55 year old female Menstrual status:  irregular O2 Sat:      96 % on Room air Pulse rate:   92 / minute Pulse rhythm:   regular Resp:     20 per minute BP sitting:   126 / 82  (right arm) Cuff size:   regular  Vitals Entered By: Dutch Quint RN (September 20, 2010 9:43 AM)  O2 Flow:  Room air  Patient Instructions: 1)  Reviewed with Jesse Fall 2)  Come back at 4 pm today and will let you know the results of your blood test . 3)  Make a follow-up appointment to see your provider at earliest available appointment. 4)  Call if anything changes or if you have any questions.   CC:  Repeat D-Dimer.  History of Present Illness: Here for repeat D-Dimer s/p DVT.  Dizziness, headache started 2-3 days ago, also having CP, started a while ago.  Used to take medication for BP, used to help CP, even though the CP still occurred.  Provider made aware, took medication for four months.    Denies CP right now.  No other complaints at present.  States she didn't have the CP last time she was here.     Review of Systems CV:  Complains of lightheadness; CP occurs more with lightheadedness and headache, increases with exertion, but will also occur without activity.  Pain is across chest, if continues activity, will sometimes radiate across to back. Pain feels like pinching, burning, will sometimes have trouble catching her breath.  States some lightheadedness and left-sided headache at present.  Denies SOB currently.. MS:  Denies pain, redness or edema to legs..   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  Orders: Pulse Oximetry (single measurment) (04540) T-D-Dimer Fibrin Derivatives Quantitive [phone])  Complete Medication List: 1)   Lisinopril 5 Mg Tabs (Lisinopril) .Marland Kitchen.. 1 tablet by mouth daily for blood pressure 2)  Ultram 50 Mg Tabs (Tramadol hcl) .Marland Kitchen.. 1 tablet by mouth two times a day as needed for pain 3)  Coumadin 5 Mg Tabs (Warfarin sodium) .... Hold   Physical Exam  General:  alert, well-developed, well-nourished, and well-hydrated.   Extremities:  Both lower extremities equal in temperature, no redness or edema.  CC: Repeat D-Dimer   Allergies: No Known Drug Allergies  Orders Added: 1)  Est. Patient Level I [95621] 2)  Pulse Oximetry (single measurment) [94760] 3)  T-D-Dimer Fibrin Derivatives Quantitive [[id number]]  Appended Document: Lab visit  Patient Instructions: 1)  You will need to restart coumadin and take nightly. 2)  Keep appointment with n.martin,fnp on February 17th.  3)  You will need pt/inr    Clinical Lists Changes  Problems: Assessed HYPERTENSION, BENIGN ESSENTIAL as comment only - BP is stable. continue current medications Her updated medication list for this problem includes:    Lisinopril 5 Mg Tabs (Lisinopril) .Marland Kitchen... 1 tablet by mouth daily for blood pressure  Assessed DEEP VENOUS THROMBOPHLEBITIS, LEG, LEFT as comment only - D- dimer is elevated will restart coumadin  Assessed MIGRAINE HEADACHE as comment only -  Her updated medication list for this problem includes:    Ultram 50 Mg Tabs (Tramadol hcl) .Marland Kitchen... 1 tablet by mouth two times a  day as needed for pain  Medications: Changed medication from COUMADIN 5 MG TABS (WARFARIN SODIUM) HOLD to COUMADIN 5 MG TABS (WARFARIN SODIUM) One tablet by mouth nighly - Signed Changed medication from ULTRAM 50 MG TABS (TRAMADOL HCL) 1 tablet by mouth two times a day as needed for pain to ULTRAM 50 MG TABS (TRAMADOL HCL) 1 tablet by mouth two times a day as needed for pain - Signed Rx of COUMADIN 5 MG TABS (WARFARIN SODIUM) One tablet by mouth nighly;  #30 x  0;  Signed;  Entered by: Lehman Prom FNP;  Authorized by: Lehman Prom FNP;  Method used: Print then Give to Patient Rx of COUMADIN 5 MG TABS (WARFARIN SODIUM) One tablet by mouth nighly;  #30 x 1;  Signed;  Entered by: Lehman Prom FNP;  Authorized by: Lehman Prom FNP;  Method used: Print then Give to Patient Rx of ULTRAM 50 MG TABS (TRAMADOL HCL) 1 tablet by mouth two times a day as needed for pain;  #30 x 0;  Signed;  Entered by: Lehman Prom FNP;  Authorized by: Lehman Prom FNP;  Method used: Print then Give to Patient Orders: Added new Service order of Est. Patient Level II ([address]) - Signed Observations: Added new observation of INSTRUCTIONS:  You will need to restart coumadin and take nightly.  Keep appointment with n.martin,fnp on February 17th.  You will need pt/inr (09/20/2010 16:42) Added new observation of NEURO EXAM: alert & oriented X3.   (09/20/2010 16:42) Added new observation of MSK EXAM: up to the exam (09/20/2010 16:42) Added new observation of HEART EXAM: normal rate and regular rhythm.   (09/20/2010 16:42) Added new observation of LUNG EXAM: normal breath sounds.   (09/20/2010 16:42) Added new observation of NOSE EXAM: no nasal discharge.   (09/20/2010 16:42) Added new observation of EAR EXAM: ear piercing(s) noted.   (09/20/2010 16:42) Added new observation of EYE EXAM: pupils reactive to light.   (09/20/2010 16:42) Added new observation of HD/FACE INSP: normocephalic.   (09/20/2010 16:42) Added new observation of PEADULT: Lehman Prom FNP ~General`Gen appear ~Head`hd/face insp ~Eyes`Eye exam ~Ears`Ear exam ~Nose`Nose exam ~Lungs`lung exam ~Heart`Heart exam ~Msk`MSK EXAM ~Neurologic`Neuro exam (09/20/2010 16:42) Added new observation of GEN APPEAR: alert.   (09/20/2010 16:42) Added new observation of UEA:VWUJWJX: Denies fever (09/20/2010 16:42) Added new observation of ROS: GI: Denies abdominal pain, nausea, vomiting (09/20/2010 16:42) Added new observation of BJY:NWGNFA: Denies cough (09/20/2010 16:42) Added new  observation of ROS: CARDIAC: Denies chest pain or discomfort (09/20/2010 16:42) Added new observation of ROS: NEURO: Complains of headaches (09/20/2010 16:42) Added new observation of BP DIASTOLIC: 80 mmHg (09/20/2010 21:30) Added new observation of BP SYSTOLIC: 120 mmHg (09/20/2010 16:42) Added new observation of CHIEF CMPLNT: Hypertension Management  (09/20/2010 16:42) Added new observation of CHD 59YR RSK: 5 %  (09/20/2010 16:42) Added new observation of HPI:  Pt presents today with c/o dizziness, chest pain, and headache.  These events are not daily.  just as needed. Headaches always starts first then other symptoms follow Headache present when she was in office this morning for labs.  (09/20/2010 16:42) Added new observation of RESP RATE: 20 /min (09/20/2010 16:42) Added new observation of PULSE RATE: 80 /min (09/20/2010 16:42) Added new observation of TEMPERATURE: 97.0 deg F (09/20/2010 16:42)     History of Present Illness:  Pt presents today with c/o dizziness, chest pain, and headache.  These events are not daily.  just as needed. Headaches always starts first then other symptoms  follow Headache present when she was in office this morning for labs.  Hypertension History:      She complains of headache, but denies chest pain and palpitations.  She notes no problems with any antihypertensive medication side effects.        Positive major cardiovascular risk factors include hypertension.  Negative major cardiovascular risk factors include female age less than 78 years old, no history of diabetes, and non-tobacco-user status.        Further assessment for target organ damage reveals no history of ASHD, cardiac end-organ damage (CHF/LVH), stroke/TIA, peripheral vascular disease, renal insufficiency, or hypertensive retinopathy.       Impression & Recommendations:  Problem # 1:  HYPERTENSION, BENIGN ESSENTIAL (ICD-401.1) BP is stable. continue current medications Her updated  medication list for this problem includes:    Lisinopril 5 Mg Tabs (Lisinopril) .Marland Kitchen... 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 1 tablet by mouth two times a day as needed for pain  Complete Medication List: 1)  Lisinopril 5 Mg Tabs (Lisinopril) .Marland Kitchen.. 1 tablet by mouth daily for blood pressure 2)  Ultram 50 Mg Tabs (Tramadol hcl) .Marland Kitchen.. 1 tablet by mouth two times a day as needed for pain 3)  Coumadin 5 Mg Tabs (Warfarin sodium) .... One tablet by mouth nighly  Hypertension Assessment/Plan:      The patient's hypertensive risk group is category A: No risk factors and no target organ damage.  Her calculated 10 year risk of coronary heart disease is 5 %.  Today's blood pressure is 120/80.  Her blood pressure goal is < 140/90.    Vital Signs:  Patient profile:   55 year old female Menstrual status:  irregular Temp:     97.0 degrees F Pulse rate:   80 / minute Resp:     20 per minute BP sitting:   120 / 80   Review of Systems General:  Denies fever. CV:  Denies chest pain or discomfort. Resp:  Denies cough. GI:  Denies abdominal pain, nausea, and vomiting. Neuro:  Complains of headaches.   Physical Exam  General:  alert.   Head:  normocephalic.   Eyes:  pupils reactive to light.   Ears:  ear piercing(s) noted.   Nose:  no nasal discharge.   Lungs:  normal breath sounds.   Heart:  normal rate and regular rhythm.   Msk:  up to the exam Neurologic:  alert & oriented X3.    Prescriptions: ULTRAM 50 MG TABS (TRAMADOL HCL) 1 tablet by mouth two times a day as needed for pain  #30 x 0   Entered and Authorized by:   Lehman Prom FNP   Signed by:   Lehman Prom FNP on 09/20/2010   Method used:   Print then Give to  Patient   RxID:   [id number] COUMADIN 5 MG TABS (WARFARIN SODIUM) One tablet by mouth nighly  #30 x 1   Entered and Authorized by:   Lehman Prom FNP   Signed by:   Lehman Prom FNP on 09/20/2010   Method used:   Print then Give to Patient   RxID:   [id number] COUMADIN 5 MG TABS (WARFARIN SODIUM) One tablet by mouth nighly  #30  x  0   Entered and Authorized by:   Lehman Prom FNP   Signed by:   Lehman Prom FNP on 09/20/2010   Method used:   Print then Give to Patient   RxID:   [id number]

## 2010-10-30 ENCOUNTER — Telehealth (INDEPENDENT_AMBULATORY_CARE_PROVIDER_SITE_OTHER): Payer: Self-pay | Admitting: Nurse Practitioner

## 2010-11-06 NOTE — Progress Notes (Signed)
Summary: Refill lisinopril  Phone Note Refill Request Call back at Home Phone [phone]   Refills Requested: Medication #1:  LISINOPRIL 5 MG TABS 1 tablet by mouth daily for blood pressure [facility] [address]  Initial call taken by: [place] Shannon,  October 30, 2010 11:37 AM  Follow-up for Phone Call        Already refilled 10/28/10.  Pt. notified to check pharmacy for completed fill.  Dutch Quint RN  October 30, 2010 12:05 PM

## 2010-11-06 NOTE — Letter (Signed)
Summary: RECEIVED RECORDS FROM [facility]  RECEIVED RECORDS FROM [facility]   Imported By: Arta Bruce 11/01/2010 08:46:50  _____________________________________________________________________  External Attachment:    Type:   Image     Comment:   External Document

## 2010-11-18 ENCOUNTER — Encounter (INDEPENDENT_AMBULATORY_CARE_PROVIDER_SITE_OTHER): Payer: Self-pay | Admitting: Nurse Practitioner

## 2010-11-18 ENCOUNTER — Encounter: Payer: Self-pay | Admitting: Nurse Practitioner

## 2010-11-18 LAB — CONVERTED CEMR LAB
INR: 1.8
Prothrombin Time: 23.5 s

## 2010-11-19 ENCOUNTER — Encounter (INDEPENDENT_AMBULATORY_CARE_PROVIDER_SITE_OTHER): Payer: Self-pay | Admitting: Nurse Practitioner

## 2010-11-19 ENCOUNTER — Ambulatory Visit (HOSPITAL_COMMUNITY)
Admission: RE | Admit: 2010-11-19 | Discharge: 2010-11-19 | Disposition: A | Discharge status: Home / Self Care | Payer: Medicaid Other | Source: Ambulatory Visit | Attending: Internal Medicine | Admitting: Internal Medicine

## 2010-11-19 DIAGNOSIS — M79609 Pain in unspecified limb: Secondary | ICD-10-CM

## 2010-11-21 ENCOUNTER — Encounter (INDEPENDENT_AMBULATORY_CARE_PROVIDER_SITE_OTHER): Payer: Self-pay | Admitting: Nurse Practitioner

## 2010-11-21 ENCOUNTER — Encounter: Payer: Self-pay | Admitting: Nurse Practitioner

## 2010-11-26 NOTE — Letter (Signed)
Summary: ANTICOAGULATION DOSING  ANTICOAGULATION DOSING   Imported By: Arta Bruce 11/22/2010 11:15:32  _____________________________________________________________________  External Attachment:    Type:   Image     Comment:   External Document

## 2010-11-26 NOTE — Miscellaneous (Signed)
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  Complete Medication List: 1)  Lisinopril 5 Mg Tabs (Lisinopril) .Marland Kitchen.. 1 tablet by mouth daily for blood pressure 2)  Ultram 50 Mg Tabs (Tramadol hcl) .Marland Kitchen.. 1 tablet by mouth two times a day as needed for pain 3)  Coumadin 5 Mg Tabs (Warfarin sodium) .... 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 Assessed DEEP VENOUS THROMBOPHLEBITIS, LEG, LEFT as comment only - doppler results - Findings consistent with chronic deep vein thrombosis involvingthe left lower extremity. Orders: Est. Patient Level I (91478)  Observations: Added new observation of HPI: Pt into the office for f/u on doppler done earlier this week she was instructed to take coumadin 5mg  - 2 tablets on mon, tues and wednesday (she was subtherapeutic earlier this week) and to f/u for recheck today (11/21/2010 14:09)

## 2010-11-26 NOTE — Assessment & Plan Note (Signed)
Summary: Left leg pain   Vital Signs:  Patient profile:   55 year old female Menstrual status:  irregular Weight:      138.5 pounds BMI:     26.48 Temp:     97.3 degrees F oral Pulse rate:   87 / minute Pulse rhythm:   regular Resp:     20 per minute BP sitting:   139 / 104  (left arm) Cuff size:   regular  Vitals Entered By: Levon Hedger (November 18, 2010 3:38 PM)  Nutrition Counseling: Patient's BMI is greater than 25 and therefore counseled on weight management options. CC: check up.Marland KitchenMarland KitchenMarland KitchenMarland Kitchenpt did not get labs done last week, Hypertension Management, Back Pain Is Patient Diabetic? No Pain Assessment Patient in pain? yes     Location: back, leg  Does patient need assistance? Functional Status Self care Ambulation Normal   CC:  check up.Marland KitchenMarland KitchenMarland KitchenMarland Kitchenpt did not get labs done last week, Hypertension Management, and Back Pain.  History of Present Illness:  Pt into the office for routine f/u. 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    Pt presents today with interpreter (daugher in law)  Anticoagulation Management History:      The patient is taking warfarin and comes in today for a routine follow up visit.  Anticoagulation is being administered due to deep venous thrombosis and/or pulmonary embolism which has been recurrent and is associated with continued risk factors.  Negative risk factors for bleeding include an age less than 48 years old, no history of CVA/TIA, no history of GI bleeding, and absence of serious comorbidities.  The bleeding index is 'low risk'.  Positive CHADS2 values include History of HTN.  Negative CHADS2 values include Age > 24 years old, History of Diabetes, and Prior Stroke/CVA/TIA.  Plans are to continue warfarin for life.  Her last INR was 1.07 and today's INR is 1.8.  Prothrombin time is 23.5.    Back Pain History:      The patient's back pain has been present for > 6 weeks.  She states that she has had a prior history of back pain.  The patient has  not had any recent physical therapy for her back pain.    Critical Exclusionary Diagnosis Criteria (CEDC) for Back Pain:      The patient denies a history of previous trauma.  She has no prior history of spinal surgery.  There are no symptoms to suggest infection, cancer, cauda equina, or psychosocial factors for back pain.    Hypertension History:      Positive major cardiovascular risk factors include hypertension.  Negative major cardiovascular risk factors include female age less than 45 years old, no history of diabetes, and non-tobacco-user status.        Further assessment for target organ damage reveals no history of ASHD, cardiac end-organ damage (CHF/LVH), stroke/TIA, peripheral vascular disease, renal insufficiency, or hypertensive retinopathy.      Medications Prior to Update: 1)  Lisinopril 5 Mg Tabs (Lisinopril) .Marland Kitchen.. 1 Tablet By Mouth Daily For Blood Pressure 2)  Ultram 50 Mg Tabs (Tramadol Hcl) .Marland Kitchen.. 1 Tablet By Mouth Two Times A Day As Needed For Pain 3)  Coumadin 5 Mg Tabs (Warfarin Sodium) .... One Tablet By Mouth Nighly  Current Medications (verified): 1)  Lisinopril 5 Mg Tabs (Lisinopril) .Marland Kitchen.. 1 Tablet By Mouth Daily For Blood Pressure 2)  Ultram 50 Mg Tabs (Tramadol Hcl) .Marland Kitchen.. 1 Tablet By Mouth Two Times A Day As Needed For Pain  3)  Coumadin 5 Mg Tabs (Warfarin Sodium) .... One Tablet By Mouth Nighly  Allergies (verified): No Known Drug Allergies  Review of Systems CV:  Denies chest pain or discomfort. Resp:  Denies cough. GI:  Denies abdominal pain, nausea, and vomiting. MS:  Complains of muscle and cramps; pain in left leg especially when walking.  Physical Exam  General:  alert.   Head:  normocephalic.   Lungs:  normal breath sounds.   Heart:  normal rate and regular rhythm.   Abdomen:  normal bowel sounds.   Msk:  left calf circumference 14cm right calf circumference - 12.5cm Pulses:  Bil DP pulses present Neurologic:  alert & oriented X3.      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 1 tablet by mouth daily for blood pressure  Complete Medication List: 1)  Lisinopril 5 Mg Tabs (Lisinopril) .Marland Kitchen.. 1 tablet by mouth daily for blood pressure 2)  Ultram 50 Mg Tabs (Tramadol hcl) .Marland Kitchen.. 1 tablet by mouth two times a day as needed for pain 3)  Coumadin 5 Mg Tabs (Warfarin sodium) .... One tablet by mouth nighly  Anticoagulation Management Assessment/Plan:      The patient's current anticoagulation dose is Coumadin 5 mg tabs: One tablet by mouth nighly.  The target INR is 2.0-3.0.  The next INR is due 07/18/2010.  Anticoagulation instructions were given to son.         Prior Anticoagulation Instructions: The patient is to stop coumadin.    Current Anticoagulation Instructions: Take 2 tablets by mouth nightly then resume Coumadin 5 mg tabs: One tablet by mouth nighly.    Hypertension Assessment/Plan:      The patient's hypertensive risk group is category A: No risk factors and no target organ damage.  Her calculated 10 year risk of coronary heart disease is 8 %.  Today's blood pressure is 139/104.  Her blood pressure goal is < 140/90.  Patient Instructions: 1)  Keep your appoinment for Ultrasound on 11/19/2010 at 10:45AM at [facility]. 2)  Schedule an appointment on Thursday afternoon with triage nurse [facility]) for blood pressure check.  Will need results of doppler AND repeat PT/INR.  Will also need refills on coumadin.   Orders Added: 1)   T-Protime, Auto [[id number]] 2)  Est. Patient Level III [84696] 3)  LE Venous Duplex (DVT) [DVT]

## 2010-11-26 NOTE — Assessment & Plan Note (Signed)
Summary: BP recheck with repeat PT/INR  Nurse Visit   Vital Signs:  Patient profile:   55 year old female Menstrual status:  irregular Pulse rate:   84 / minute Pulse rhythm:   regular Resp:     20 per minute BP sitting:   96 / 68  (left arm) Cuff size:   regular  Vitals Entered By: Dutch Quint RN (November 21, 2010 2:55 PM)  CC:  BP recheck with repeat PT/INR.  History of Present Illness: 11/18/10  BP 139/104  P 87.  Has DVT, on Coumadin.  To have taken 2 tabs Coumadin 5 mg. Mon, Tues and Wed. nights, to have PT/INR redrawn today, along with BP recheck.  Anticoagulation Management History:      The patient is taking warfarin and comes in today for a routine follow up visit.  The patient is taking warfarin for deep venous thrombosis and/or pulmonary embolism which has been recurrent and is associated with continued risk factors.  Negative risk factors for bleeding include an age less than 34 years old, no history of CVA/TIA, no history of GI bleeding, and absence of serious comorbidities.  The bleeding index is 'low risk'.  Positive CHADS2 values include History of HTN.  Negative CHADS2 values include Age > 31 years old, History of Diabetes, and Prior Stroke/CVA/TIA.  Plans are to continue warfarin for life.  Her last INR was 1.8.      Physical Exam  Msk:  right - 14.25cm left - 13cm Pulses:  +pain with dorsiflexion Neurologic:  alert & oriented X3.     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 1 tablet by mouth daily for blood pressure 2)  Ultram 50 Mg Tabs (Tramadol hcl) .Marland Kitchen.. 1 tablet by mouth two times a day as needed for pain 3)  Coumadin 5 Mg Tabs (Warfarin sodium) .... One tablet by mouth  nighly  Anticoagulation Management Assessment/Plan:      The patient's current anticoagulation dose is Coumadin 5 mg tabs: One tablet by mouth nighly.  The target INR is 2.0-3.0.  The next INR is due 11/28/2010.  Anticoagulation instructions were given to son.         Current Anticoagulation Instructions: The patient is to continue with the same dose of coumadin.  This dosage includes:  Coumadin 5 mg tabs: One tablet by mouth nighly.     Patient Instructions: 1)  You have another blood clot in your left leg 2)  Schedule lab visit for PT/INR in 1 week.  Pharmacy to adjust coumadin 3)  Follow up with n.martin, fnp in 4 weeks for DVT and blood pressure.  Will discuss possibilty of filter given your history of DVT (recurrent)  CC: BP recheck with repeat PT/INR Is Patient Diabetic? No  Does patient need assistance? Functional Status Self care Ambulation Normal   Allergies: No Known Drug Allergies  Orders Added: 1)  Est. Patient Level I [29562] 2)  T-Protime, Auto [[id number]] Prescriptions: COUMADIN 5 MG TABS (WARFARIN SODIUM) One tablet by mouth nighly  #30 x 0   Entered and Authorized by:   Lehman Prom FNP   Signed by:   Lehman Prom FNP on 11/21/2010   Method used:   Print then Give to Patient  RxID:   [id number] ULTRAM 50 MG TABS (TRAMADOL HCL) 1 tablet by mouth two times a day as needed for pain  #30 x 0   Entered and Authorized by:   Lehman Prom FNP   Signed by:   Lehman Prom FNP on 11/21/2010   Method used:   Print then Give to Patient   RxID:   [id number] COUMADIN 5 MG TABS (WARFARIN SODIUM) One tablet by mouth nighly  #30 x 0   Entered by:   Dutch Quint RN   Authorized by:   Lehman Prom FNP   Signed by:   Dutch Quint RN on 11/21/2010   Method used:   Print then Give to Patient   RxID:   [id number] ULTRAM 50 MG TABS (TRAMADOL HCL) 1 tablet by mouth two times a day as needed for pain  #30 x 0   Entered by:   Dutch Quint RN    Authorized by:   Lehman Prom FNP   Signed by:   Dutch Quint RN on 11/21/2010   Method used:   Print then Give to Patient   RxID:   [id number] ULTRAM 50 MG TABS (TRAMADOL HCL) 1 tablet by mouth two times a day as needed for pain  #30 x 0   Entered by:   Dutch Quint RN   Authorized by:   Lehman Prom FNP   Signed by:   Dutch Quint RN on 11/21/2010   Method used:   Print then Give to Patient   RxID:   [id number] COUMADIN 5 MG TABS (WARFARIN SODIUM) One tablet by mouth nighly  #30 x 0   Entered by:   Dutch Quint RN   Authorized by:   Lehman Prom FNP   Signed by:   Dutch Quint RN on 11/21/2010   Method used:   Print then Give to Patient   RxID:   [id number]

## 2010-11-29 ENCOUNTER — Encounter (INDEPENDENT_AMBULATORY_CARE_PROVIDER_SITE_OTHER): Payer: Self-pay | Admitting: Nurse Practitioner

## 2010-12-02 LAB — PROTIME-INR: Prothrombin Time: 25 seconds — ABNORMAL HIGH (ref 11.6–15.2)

## 2010-12-02 LAB — POCT I-STAT, CHEM 8
Calcium, Ion: 1.17 mmol/L (ref 1.12–1.32)
HCT: 42 % (ref 36.0–46.0)
TCO2: 28 mmol/L (ref 0–100)

## 2010-12-03 NOTE — Letter (Signed)
Summary: ANTICOAGULATION DOSING  ANTICOAGULATION DOSING   Imported By: Arta Bruce 11/29/2010 16:00:57  _____________________________________________________________________  External Attachment:    Type:   Image     Comment:   External Document

## 2010-12-26 LAB — HEPATIC FUNCTION PANEL
Alkaline Phosphatase: 88 U/L (ref 39–117)
Indirect Bilirubin: 0.7 mg/dL (ref 0.3–0.9)
Total Protein: 7.6 g/dL (ref 6.0–8.3)

## 2010-12-26 LAB — DIFFERENTIAL
Basophils Absolute: 0 10*3/uL (ref 0.0–0.1)
Eosinophils Relative: 1 % (ref 0–5)
Lymphocytes Relative: 10 % — ABNORMAL LOW (ref 12–46)
Neutro Abs: 7.6 10*3/uL (ref 1.7–7.7)

## 2010-12-26 LAB — BASIC METABOLIC PANEL
BUN: 7 mg/dL (ref 6–23)
Calcium: 9 mg/dL (ref 8.4–10.5)
GFR calc non Af Amer: >60 mL/min (ref >60)
Glucose, Bld: 110 mg/dL — ABNORMAL HIGH (ref 70–99)

## 2010-12-26 LAB — TSH: TSH: 0.199 u[IU]/mL — ABNORMAL LOW (ref 0.350–4.500)

## 2010-12-26 LAB — D-DIMER, QUANTITATIVE: D-Dimer, Quant: 3.06 ug/mL-FEU — ABNORMAL HIGH (ref 0.00–0.48)

## 2010-12-26 LAB — CBC
Platelets: 292 10*3/uL (ref 150–400)
RDW: 14.9 % (ref 11.5–15.5)

## 2011-06-12 LAB — POCT I-STAT, CHEM 8
BUN: 7
Calcium, Ion: 1.14
Creatinine, Ser: 0.7
Glucose, Bld: 88
Hemoglobin: 12.9
Sodium: 139
TCO2: 23

## 2012-05-28 ENCOUNTER — Encounter (HOSPITAL_COMMUNITY): Payer: Self-pay | Admitting: Emergency Medicine

## 2012-05-28 ENCOUNTER — Emergency Department (HOSPITAL_COMMUNITY)
Admission: EM | Admit: 2012-05-28 | Discharge: 2012-05-28 | Disposition: A | Discharge status: Home / Self Care | Payer: Medicaid Other | Source: Home / Self Care

## 2012-05-28 ENCOUNTER — Emergency Department (INDEPENDENT_AMBULATORY_CARE_PROVIDER_SITE_OTHER): Payer: Medicaid Other

## 2012-05-28 DIAGNOSIS — R042 Hemoptysis: Secondary | ICD-10-CM

## 2012-05-28 DIAGNOSIS — A150 Tuberculosis of lung: Secondary | ICD-10-CM

## 2012-05-28 DIAGNOSIS — M25569 Pain in unspecified knee: Secondary | ICD-10-CM

## 2012-05-28 HISTORY — DX: Essential (primary) hypertension: I10

## 2012-05-28 LAB — PROTIME-INR
INR: 2.84 — ABNORMAL HIGH (ref 0.00–1.49)
Prothrombin Time: 30.3 s — ABNORMAL HIGH (ref 11.6–15.2)

## 2012-05-28 NOTE — ED Provider Notes (Signed)
History     CSN: [id number]  Arrival date & time 05/28/12  1732   None     Chief Complaint  Patient presents with  . Hemoptysis  . Leg Pain    (Consider location/radiation/quality/duration/timing/severity/associated sxs/prior treatment) HPI Comments: [employer] pt. Through interpreter, Bleeding from mouth when coughs since yesterday, 2 occurances of hemoptysis. She is chronically,  but mildly dyspneic since that time. Occasional chest pain with cough.   L leg pain for 3 yrs , She apparently had severe vascular insufficiency/occlusion that required vascular surgery. She points to the medial aspect of the knee as the source of acute pain. Denies known injury. No calf or thigh pain.   Patient is a 57 y.o. female presenting with leg pain.  Leg Pain     Past Medical History  Diagnosis Date  . Hypertension     No past surgical history on file.  No family history on file.  History  Substance Use Topics  . Smoking status: Not on file  . Smokeless tobacco: Not on file  . Alcohol Use:     OB History    Grav Para Term Preterm Abortions TAB SAB Ect Mult Living                  Review of Systems  Constitutional: Negative for fever, chills and activity change.  Respiratory: Positive for shortness of breath. Negative for cough and chest tightness.   Cardiovascular: Positive for chest pain.  Gastrointestinal: Negative.   Musculoskeletal: Positive for arthralgias. Negative for joint swelling and gait problem.  Skin: Negative.   Neurological: Negative.     Allergies  Review of patient's allergies indicates not on file.  Home Medications   Current Outpatient Rx  Name Route Sig Dispense Refill  . LISINOPRIL-HYDROCHLOROTHIAZIDE 10-12.5 MG PO TABS Oral Take 1 tablet by mouth daily.    . TRAMADOL HCL 50 MG PO TABS Oral Take 50 mg by mouth every 6 (six) hours as needed.    . WARFARIN SODIUM 5 MG PO TABS Oral Take 5 mg by mouth daily.      BP 135/93  Pulse 94  Temp  97.9 F (36.6 C) (Oral)  Resp 16  SpO2 99%  Physical Exam  Constitutional: She is oriented to person, place, and time. She appears well-developed and well-nourished. No distress.  Neck: Normal range of motion. Neck supple.  Cardiovascular: Normal rate and normal heart sounds.   Pulmonary/Chest: Effort normal and breath sounds normal. No respiratory distress. She has no wheezes.  Abdominal: Soft. There is tenderness.  Musculoskeletal: She exhibits tenderness. She exhibits no edema.       Tenderness medial condyles. No swelling or discoloration.   Tenderness to the L parasternal borders and L anterior chest wall  Neurological: She is alert and oriented to person, place, and time.  Skin: Skin is warm and dry. She is not diaphoretic.    ED Course  Procedures (including critical care time)  Labs Reviewed  PROTIME-INR - Abnormal; Notable for the following:    Prothrombin Time 30.3 (*)     INR 2.84 (*)     All other components within normal limits   Dg Chest 2 View  05/28/2012  *RADIOLOGY REPORT*  Clinical Data: Hemoptysis.  Remote history of tuberculosis.  CHEST - 2 VIEW  Comparison: CTA chest 11/13/2008.  One-view chest x-ray 03/14/2008, 02/22/2008 and two-view chest x-ray 07/23/2007 [facility].  Findings: Pleuroparenchymal scarring and bronchiectasis involving the right lung apex, unchanged.  Parenchymal  scarring in the left upper lobe, unchanged.  Parenchymal scarring at the right lung base, unchanged.  No new pulmonary parenchymal abnormalities. Cardiac silhouette upper normal in size, unchanged.  Thoracic aorta tortuous, unchanged.  Hilar and mediastinal contours otherwise unremarkable.  Tracheal deviation to the right due to the right apical pleuroparenchymal scarring, unchanged.  Visualized bony thorax intact.  IMPRESSION: Stable pleuroparenchymal scarring and bronchiectasis in the right lung apex.  Stable parenchymal scarring in the left upper lobe and at the right lung  base.  No new/acute cardiopulmonary disease.   Original Report Authenticated By: Arnell Sieving, M.D.      1. Cough with hemoptysis   2. Knee pain   3. Bronchiectasis, tuberculous       MDM  Dg Chest 2 View  05/28/2012  *RADIOLOGY REPORT*  Clinical Data: Hemoptysis.  Remote history of tuberculosis.  CHEST - 2 VIEW  Comparison: CTA chest 11/13/2008.  One-view chest x-ray 03/14/2008, 02/22/2008 and two-view chest x-ray 07/23/2007 [facility].  Findings: Pleuroparenchymal scarring and bronchiectasis involving the right lung apex, unchanged.  Parenchymal scarring in the left upper lobe, unchanged.  Parenchymal scarring at the right lung base, unchanged.  No new pulmonary parenchymal abnormalities. Cardiac silhouette upper normal in size, unchanged.  Thoracic aorta tortuous, unchanged.  Hilar and mediastinal contours otherwise unremarkable.  Tracheal deviation to the right due to the right apical pleuroparenchymal scarring, unchanged.  Visualized bony thorax intact.  IMPRESSION: Stable pleuroparenchymal scarring and bronchiectasis in the right lung apex.  Stable parenchymal scarring in the left upper lobe and at the right lung base.  No new/acute cardiopulmonary disease.   Original Report Authenticated By: Arnell Sieving, M.D.    She is to f/u with Dr. Sherene Sires and Dr. Shirlee Latch , cards Results for orders placed during the hospital encounter of 05/28/12  PROTIME-INR      Component Value Range   Prothrombin Time 30.3 (*) 11.6 - 15.2 seconds   INR 2.84 (*) 0.00 - 1.49   She is no distress, stable. No further action needed today but needs the follow up.       Hayden Rasmussen, NP 05/28/12 2225

## 2012-05-28 NOTE — ED Notes (Signed)
C/o of coughing up blood tinged sputum x 2 today C/o of leg pain x 3 years- seen previously at [facility]

## 2012-05-28 NOTE — ED Provider Notes (Signed)
Medical screening examination/treatment/procedure(s) were performed by non-physician practitioner and as supervising physician I was immediately available for consultation/collaboration. I agree that this lady does not present with any acute, cardiopulmonary disease and needs followup with a cardiologist and a pulmonologist in the near future.  Leslee Home, M.D.   Reuben Likes, MD 05/28/12 [phone]

## 2012-05-30 ENCOUNTER — Inpatient Hospital Stay (HOSPITAL_COMMUNITY)
Admission: EM | Admit: 2012-05-30 | Discharge: 2012-06-03 | DRG: 167 | Disposition: A | Discharge status: Home / Self Care | Payer: Medicaid Other | Attending: Internal Medicine | Admitting: Internal Medicine

## 2012-05-30 ENCOUNTER — Emergency Department (HOSPITAL_COMMUNITY): Payer: Medicaid Other

## 2012-05-30 ENCOUNTER — Encounter (HOSPITAL_COMMUNITY): Payer: Self-pay | Admitting: *Deleted

## 2012-05-30 DIAGNOSIS — R042 Hemoptysis: Principal | ICD-10-CM

## 2012-05-30 DIAGNOSIS — Z23 Encounter for immunization: Secondary | ICD-10-CM

## 2012-05-30 DIAGNOSIS — R3129 Other microscopic hematuria: Secondary | ICD-10-CM

## 2012-05-30 DIAGNOSIS — I10 Essential (primary) hypertension: Secondary | ICD-10-CM | POA: Diagnosis present

## 2012-05-30 DIAGNOSIS — D689 Coagulation defect, unspecified: Secondary | ICD-10-CM

## 2012-05-30 DIAGNOSIS — R0602 Shortness of breath: Secondary | ICD-10-CM

## 2012-05-30 DIAGNOSIS — I82409 Acute embolism and thrombosis of unspecified deep veins of unspecified lower extremity: Secondary | ICD-10-CM

## 2012-05-30 DIAGNOSIS — J479 Bronchiectasis, uncomplicated: Secondary | ICD-10-CM

## 2012-05-30 DIAGNOSIS — I825Y9 Chronic embolism and thrombosis of unspecified deep veins of unspecified proximal lower extremity: Secondary | ICD-10-CM | POA: Diagnosis present

## 2012-05-30 DIAGNOSIS — I82509 Chronic embolism and thrombosis of unspecified deep veins of unspecified lower extremity: Secondary | ICD-10-CM

## 2012-05-30 DIAGNOSIS — M545 Low back pain: Secondary | ICD-10-CM

## 2012-05-30 DIAGNOSIS — G43909 Migraine, unspecified, not intractable, without status migrainosus: Secondary | ICD-10-CM

## 2012-05-30 DIAGNOSIS — Z8611 Personal history of tuberculosis: Secondary | ICD-10-CM

## 2012-05-30 DIAGNOSIS — N926 Irregular menstruation, unspecified: Secondary | ICD-10-CM

## 2012-05-30 DIAGNOSIS — Z79899 Other long term (current) drug therapy: Secondary | ICD-10-CM

## 2012-05-30 DIAGNOSIS — Z7901 Long term (current) use of anticoagulants: Secondary | ICD-10-CM

## 2012-05-30 DIAGNOSIS — I209 Angina pectoris, unspecified: Secondary | ICD-10-CM

## 2012-05-30 HISTORY — DX: Respiratory tuberculosis unspecified: A15.9

## 2012-05-30 HISTORY — DX: Chronic embolism and thrombosis of unspecified deep veins of unspecified lower extremity: I82.509

## 2012-05-30 HISTORY — DX: Angina pectoris, unspecified: I20.9

## 2012-05-30 HISTORY — DX: Shortness of breath: R06.02

## 2012-05-30 HISTORY — DX: Other chronic pain: G89.29

## 2012-05-30 LAB — CBC WITH DIFFERENTIAL/PLATELET
Basophils Absolute: 0 10*3/uL (ref 0.0–0.1)
Basophils Relative: 0 % (ref 0–1)
Eosinophils Absolute: 0.1 10*3/uL (ref 0.0–0.7)
Eosinophils Relative: 1 % (ref 0–5)
HCT: 37.3 % (ref 36.0–46.0)
Hemoglobin: 12.5 g/dL (ref 12.0–15.0)
MCH: 29.6 pg (ref 26.0–34.0)
MCHC: 33.5 g/dL (ref 30.0–36.0)
MCV: 88.4 fL (ref 78.0–100.0)
Monocytes Absolute: 0.4 10*3/uL (ref 0.1–1.0)
Monocytes Relative: 8 % (ref 3–12)
Neutro Abs: 3.4 10*3/uL (ref 1.7–7.7)
RDW: 13.4 % (ref 11.5–15.5)

## 2012-05-30 LAB — BASIC METABOLIC PANEL
BUN: 11 mg/dL (ref 6–23)
Calcium: 9.5 mg/dL (ref 8.4–10.5)
Chloride: 105 mEq/L (ref 96–112)
Creatinine, Ser: 0.64 mg/dL (ref 0.50–1.10)
GFR calc Af Amer: >90 mL/min (ref >90)

## 2012-05-30 LAB — POCT I-STAT TROPONIN I: Troponin i, poc: 0 ng/mL (ref 0.00–0.08)

## 2012-05-30 LAB — PRO B NATRIURETIC PEPTIDE: Pro B Natriuretic peptide (BNP): 153 pg/mL — ABNORMAL HIGH (ref 0–125)

## 2012-05-30 MED ORDER — ACETAMINOPHEN 325 MG PO TABS
650.0000 mg | ORAL_TABLET | Freq: Four times a day (QID) | ORAL | Status: DC | PRN
  Administered 2012-06-01 – 2012-06-03 (×2): 650 mg via ORAL
  Filled 2012-05-30 (×2): qty 2

## 2012-05-30 MED ORDER — ACETAMINOPHEN 650 MG RE SUPP: 650.0000 mg | Freq: Four times a day (QID) | RECTAL | Status: DC | PRN

## 2012-05-30 MED ORDER — SODIUM CHLORIDE 0.9 % IV SOLN
INTRAVENOUS | Status: DC
  Administered 2012-05-30: 19:00:00 via INTRAVENOUS
  Administered 2012-06-01: 1000 mL via INTRAVENOUS
  Administered 2012-06-02 (×2): via INTRAVENOUS

## 2012-05-30 MED ORDER — ONDANSETRON 4 MG PO TBDP
4.0000 mg | ORAL_TABLET | Freq: Once | ORAL | Status: AC
  Administered 2012-05-30: 4 mg via ORAL
  Filled 2012-05-30: qty 1

## 2012-05-30 MED ORDER — LISINOPRIL 10 MG PO TABS
10.0000 mg | ORAL_TABLET | Freq: Every day | ORAL | Status: DC
  Administered 2012-05-31 – 2012-06-03 (×4): 10 mg via ORAL
  Filled 2012-05-30 (×4): qty 1

## 2012-05-30 MED ORDER — HYDROMORPHONE HCL PF 1 MG/ML IJ SOLN
1.0000 mg | Freq: Once | INTRAMUSCULAR | Status: AC
  Administered 2012-05-30: 1 mg via INTRAVENOUS
  Filled 2012-05-30: qty 1

## 2012-05-30 MED ORDER — INFLUENZA VIRUS VACC SPLIT PF IM SUSP
0.5000 mL | Freq: Once | INTRAMUSCULAR | Status: AC
  Administered 2012-05-31: 0.5 mL via INTRAMUSCULAR
  Filled 2012-05-30 (×2): qty 0.5

## 2012-05-30 MED ORDER — SODIUM CHLORIDE 0.9 % IV SOLN: 250.0000 mL | INTRAVENOUS | Status: DC | PRN

## 2012-05-30 MED ORDER — WARFARIN - PHARMACIST DOSING INPATIENT: Freq: Every day | Status: DC

## 2012-05-30 MED ORDER — LISINOPRIL-HYDROCHLOROTHIAZIDE 10-12.5 MG PO TABS: 1.0000 | ORAL_TABLET | Freq: Every day | ORAL | Status: DC

## 2012-05-30 MED ORDER — SODIUM CHLORIDE 0.9 % IJ SOLN
3.0000 mL | INTRAMUSCULAR | Status: DC | PRN
  Administered 2012-05-30: 3 mL via INTRAVENOUS

## 2012-05-30 MED ORDER — MOXIFLOXACIN HCL IN NACL 400 MG/250ML IV SOLN
400.0000 mg | INTRAVENOUS | Status: DC
  Administered 2012-05-30 – 2012-06-01 (×3): 400 mg via INTRAVENOUS
  Filled 2012-05-30 (×4): qty 250

## 2012-05-30 MED ORDER — TRAMADOL HCL 50 MG PO TABS
50.0000 mg | ORAL_TABLET | Freq: Four times a day (QID) | ORAL | Status: DC | PRN
  Administered 2012-05-31: 50 mg via ORAL
  Filled 2012-05-30 (×2): qty 1

## 2012-05-30 MED ORDER — AZITHROMYCIN 250 MG PO TABS
500.0000 mg | ORAL_TABLET | Freq: Once | ORAL | Status: AC
  Administered 2012-05-30: 500 mg via ORAL
  Filled 2012-05-30: qty 2

## 2012-05-30 MED ORDER — HYDROCHLOROTHIAZIDE 12.5 MG PO CAPS
12.5000 mg | ORAL_CAPSULE | Freq: Every day | ORAL | Status: DC
  Administered 2012-05-31 – 2012-06-03 (×4): 12.5 mg via ORAL
  Filled 2012-05-30 (×4): qty 1

## 2012-05-30 MED ORDER — DEXTROSE 5 % IV SOLN
1.0000 g | Freq: Once | INTRAVENOUS | Status: AC
  Administered 2012-05-30: 1 g via INTRAVENOUS
  Filled 2012-05-30: qty 10

## 2012-05-30 MED ORDER — SODIUM CHLORIDE 0.9 % IJ SOLN
3.0000 mL | Freq: Two times a day (BID) | INTRAMUSCULAR | Status: DC
  Administered 2012-05-31 – 2012-06-01 (×2): 3 mL via INTRAVENOUS

## 2012-05-30 NOTE — ED Notes (Signed)
Transported to floor by Council Mechanic, EMT

## 2012-05-30 NOTE — ED Notes (Addendum)
Admitting MD at bedside speaking to pt with translator phones. Report was called to floor by Deniece Portela, Charity fundraiser.

## 2012-05-30 NOTE — ED Notes (Signed)
Pt. Reports chest pain and coughing up blood for 2 days. Pt. Denies any foreign travel or sick contacts.

## 2012-05-30 NOTE — H&P (Signed)
PCP:   MARTIN,NYKEDTRA, NP   Chief Complaint:  Hemoptysis  HPI: 56 y/o [name] with h/o TB which was treated 18 yrs ago, came to the hospital with hemoptysis, which has been going on since Friday. Patient says that cough is productive with blood tinged sputum. She also complains of chest pain and shortness of breath. Patient denies fever, but says that she had chills. She denies nausea, vomiting and diarrhea. Also complains of burning in the feet and legs.   Allergies:  No Known Allergies    Past Medical History  Diagnosis Date  . Hypertension     History reviewed. No pertinent past surgical history.  Prior to Admission medications   Medication Sig Start Date End Date Taking? Authorizing Provider  lisinopril-hydrochlorothiazide (PRINZIDE,ZESTORETIC) 10-12.5 MG per tablet Take 1 tablet by mouth daily.   Yes Historical Provider, MD  traMADol (ULTRAM) 50 MG tablet Take 50 mg by mouth every 6 (six) hours as needed.   Yes Historical Provider, MD  warfarin (COUMADIN) 5 MG tablet Take 5 mg by mouth daily. 5 mg 7 days of the week   Yes Historical Provider, MD    Social History:  does not have a smoking history on file. She does not have any smokeless tobacco history on file. She reports that she does not drink alcohol or use illicit drugs.  History reviewed. No pertinent family history.  Review of Systems:  HEENT: Denies headache, blurred vision, runny nose, sore throat,  Neck: Denies thyroid problems,lymphadenopathy Chest : Denies shortness of breath, no history of COPD, h/o TB in the past Heart : Denies Chest pain,  coronary arterey disease GI: Denies  nausea, vomiting, diarrhea, constipation GU: Denies dysuria, urgency, frequency of urination Neuro: Denies stroke, seizures, syncope Psych: Denies depression, anxiety, hallucinations   Physical Exam: Blood pressure 148/91, pulse 91, temperature 98.3 F (36.8 C), temperature source Oral, SpO2 100.00%. Constitutional:    Patient is a well-developed and well-nourished  Female  in no acute distress and cooperative with exam. Head: Normocephalic and atraumatic Mouth: Mucus membranes moist Eyes: PERRL, EOMI, conjunctivae normal Neck: Supple, No Thyromegaly Cardiovascular: RRR, S1 normal, S2 normal Pulmonary/Chest: CTAB, no wheezes, rales, or rhonchi Abdominal: Soft. Non-tender, non-distended, bowel sounds are normal, no masses, organomegaly, or guarding present.  Neurological: A&O x3, Strenght is normal and symmetric bilaterally, cranial nerve II-XII are grossly intact, no focal motor deficit, sensory intact to light touch bilaterally.  Extremities : No Cyanosis, Clubbing or Edema   Labs on Admission:  Results for orders placed during the hospital encounter of 05/30/12 (from the past 48 hour(s))  CBC WITH DIFFERENTIAL     Status: Normal   Collection Time   05/30/12  9:57 AM      Component Value Range Comment   WBC 5.6  4.0 - 10.5 K/uL    RBC 4.22  3.87 - 5.11 MIL/uL    Hemoglobin 12.5  12.0 - 15.0 g/dL    HCT 16.1  09.6 - 04.5 %    MCV 88.4  78.0 - 100.0 fL    MCH 29.6  26.0 - 34.0 pg    MCHC 33.5  30.0 - 36.0 g/dL    RDW 40.9  81.1 - 91.4 %    Platelets 206  150 - 400 K/uL    Neutrophils Relative 61  43 - 77 %    Neutro Abs 3.4  1.7 - 7.7 K/uL    Lymphocytes Relative 30  12 - 46 %    Lymphs  Abs 1.7  0.7 - 4.0 K/uL    Monocytes Relative 8  3 - 12 %    Monocytes Absolute 0.4  0.1 - 1.0 K/uL    Eosinophils Relative 1  0 - 5 %    Eosinophils Absolute 0.1  0.0 - 0.7 K/uL    Basophils Relative 0  0 - 1 %    Basophils Absolute 0.0  0.0 - 0.1 K/uL   BASIC METABOLIC PANEL     Status: Normal   Collection Time   05/30/12  9:57 AM      Component Value Range Comment   Sodium 140  135 - 145 mEq/L    Potassium 3.9  3.5 - 5.1 mEq/L    Chloride 105  96 - 112 mEq/L    CO2 27  19 - 32 mEq/L    Glucose, Bld 94  70 - 99 mg/dL    BUN 11  6 - 23 mg/dL    Creatinine, Ser 1.61  0.50 - 1.10 mg/dL    Calcium 9.5  8.4  - 10.5 mg/dL    GFR calc non Af Amer >90  >90 mL/min    GFR calc Af Amer >90  >90 mL/min   PRO B NATRIURETIC PEPTIDE     Status: Abnormal   Collection Time   05/30/12  9:57 AM      Component Value Range Comment   Pro B Natriuretic peptide (BNP) 153.0 (*) 0 - 125 pg/mL   PROTIME-INR     Status: Abnormal   Collection Time   05/30/12  9:57 AM      Component Value Range Comment   Prothrombin Time 29.3 (*) 11.6 - 15.2 seconds    INR 2.72 (*) 0.00 - 1.49   POCT I-STAT TROPONIN I     Status: Normal   Collection Time   05/30/12 10:09 AM      Component Value Range Comment   Troponin i, poc 0.00  0.00 - 0.08 ng/mL    Comment 3              Radiological Exams on Admission: Dg Chest 2 View  05/28/2012  *RADIOLOGY REPORT*  Clinical Data: Hemoptysis.  Remote history of tuberculosis.  CHEST - 2 VIEW  Comparison: CTA chest 11/13/2008.  One-view chest x-ray 03/14/2008, 02/22/2008 and two-view chest x-ray 07/23/2007 [facility].  Findings: Pleuroparenchymal scarring and bronchiectasis involving the right lung apex, unchanged.  Parenchymal scarring in the left upper lobe, unchanged.  Parenchymal scarring at the right lung base, unchanged.  No new pulmonary parenchymal abnormalities. Cardiac silhouette upper normal in size, unchanged.  Thoracic aorta tortuous, unchanged.  Hilar and mediastinal contours otherwise unremarkable.  Tracheal deviation to the right due to the right apical pleuroparenchymal scarring, unchanged.  Visualized bony thorax intact.  IMPRESSION: Stable pleuroparenchymal scarring and bronchiectasis in the right lung apex.  Stable parenchymal scarring in the left upper lobe and at the right lung base.  No new/acute cardiopulmonary disease.   Original Report Authenticated By: Arnell Sieving, M.D.    Ct Angio Chest Pe W/cm &/or Wo Cm  05/30/2012  *RADIOLOGY REPORT*  Clinical Data: Chest pain with hemoptysis.  History of hypertension.  Question pulmonary embolism.  CT ANGIOGRAPHY  CHEST  Technique:  Multidetector CT imaging of the chest using the standard protocol during bolus administration of intravenous contrast. Multiplanar reconstructed images including MIPs were obtained and reviewed to evaluate the vascular anatomy.  Contrast:  60 ml Omnipaque-300 intravenously.  Comparison: Radiographs 05/28/2012.  CT 11/13/2008.  Findings: The pulmonary arteries are well opacified with contrast. There is no evidence of acute pulmonary embolism.  There is stable mild ectasia of the ascending aorta.  There is stable bilateral superior hilar retraction with chronic right upper lobe volume loss, parenchymal calcification and bronchiectasis consistent with history of tuberculosis.  No superimposed air-fluid levels are identified.  There is stable right lung nodularity, measuring 6 mm in the lower lobe (image 24), 7 mm in the middle lobe (image 29) and 5 mm in the lower lobe (image 32).  Lesser parenchymal scarring and nodularity in the left lung also appears stable.  No endobronchial lesion is seen.  Low-density left thyroid nodule has decreased in size, measuring 2.6 cm on image #7.  There is improved tracheal deviation to the right.  Mild nodal prominence in the lower right paratracheal region is stable.  There is no progressive adenopathy.  There is no pleural or pericardial effusion.  Images through the upper abdomen demonstrate diffuse hepatic steatosis and mildly prominent lymph nodes within the porta hepatis, not previously imaged.  IMPRESSION:  1.  No evidence of acute pulmonary embolism. 2.  Grossly stable chronic changes consistent with old tuberculosis infection.  There is scattered nodularity with chronic volume loss and bronchiectasis in the right upper lobe. 3.  No definite acute superimposed inflammatory or neoplastic process identified. 4.  Hepatic steatosis.   Original Report Authenticated By: Gerrianne Scale, M.D.      Assessment/Plan  Hemoptysis Hypertension DVT  Hemoptysis Patient will be started on avelox for possible acute bronchitis. Will also put on airborne isolation to r/o TB activation, though patient says she was fully treated in [place] for 6 months treatment about 18 yrs ago.  Hypertension Will continue on Lisinopril/Hydrochlorthiazide  DVT  Will continue on Coumadin per pharmacy  ? Periphral neuropathy Check b12 levels  Time Spent on Admission: 70 min  Branon Sabine S [facility] Pager: [phone] 05/30/2012, 4:17 PM

## 2012-05-30 NOTE — ED Provider Notes (Signed)
1:25 PM BP 148/91  Pulse 91  Temp 98.3 F (36.8 C) (Oral)  SpO2 100% Received report of the patient. Dr. Bebe Shaggy. The son a police woman, who presents with report of 2 day history of coughing up blood and chest pain. CT scan is negative for any acute abnormalities. However, patient does have history of old TB infection. Patient is awaiting admission by internal medicine. She complains of pain currently rates it at a 9/10. Translation is provided by her son, who is in the room.  Patient appears uncomfortable CV: RRR, No M/R/G, Peripheral pulses intact. No peripheral edema. Lungs: CTAB Abd: Soft, tender to palpation of the right, and left upper quadrant and epigastric region. Tender to palpation of the left and right side of the rib cage.  I have place consult to internal medicine who agrees to admit patient and assume care for further work up.    Arthor Captain, PA-C 06/02/12 2024

## 2012-05-30 NOTE — Progress Notes (Signed)
Report called to charge nurse on [facility].  Pt will be transferred by next shift to this floor.  Pt and family are aware of transfer.

## 2012-05-30 NOTE — ED Provider Notes (Signed)
History     CSN: [id number]  Arrival date & time 05/30/12  4098   First MD Initiated Contact with Patient 05/30/12 [phone]      Chief Complaint  Patient presents with  . Chest Pain  . Hemoptysis     Patient is a 56 y.o. female presenting with cough. The history is provided by the patient and a relative. A language interpreter was used Conservation officer, historic buildings).  Cough This is a new problem. Episode onset: several days ago. The problem occurs every few hours. The problem has not changed since onset.The cough is productive of blood-tinged sputum. There has been no fever. Associated symptoms include chest pain and shortness of breath. Pertinent negatives include no sweats and no weight loss. She has tried nothing for the symptoms. The treatment provided no relief.  pt reports she has had cough and coughig up blood - reports rarely coughs but when she does there is blood No epistaxis No vomiting No oral bleeding reported She reports orthopnea She reports dyspnea on exertion that is worsening She reports shortness of breath She reports some CP at times as well  No recent travel No sweats/weight loss reported  She is on coumadin for previous DVT    Past Medical History  Diagnosis Date  . Hypertension     History reviewed. No pertinent past surgical history.  History reviewed. No pertinent family history.  History  Substance Use Topics  . Smoking status: Not on file  . Smokeless tobacco: Not on file  . Alcohol Use: No    OB History    Grav Para Term Preterm Abortions TAB SAB Ect Mult Living                  Review of Systems  Constitutional: Negative for weight loss.  Respiratory: Positive for cough and shortness of breath.   Cardiovascular: Positive for chest pain.  All other systems reviewed and are negative.    Allergies  Review of patient's allergies indicates no known allergies.  Home Medications   Current Outpatient Rx  Name Route Sig Dispense Refill  .  LISINOPRIL-HYDROCHLOROTHIAZIDE 10-12.5 MG PO TABS Oral Take 1 tablet by mouth daily.    . TRAMADOL HCL 50 MG PO TABS Oral Take 50 mg by mouth every 6 (six) hours as needed.    . WARFARIN SODIUM 5 MG PO TABS Oral Take 5 mg by mouth daily. 5 mg 7 days of the week      BP 148/91  Pulse 91  Temp 98.3 F (36.8 C) (Oral)  SpO2 100%  Physical Exam CONSTITUTIONAL: Well developed/well nourished HEAD AND FACE: Normocephalic/atraumatic EYES: EOMI/PERRL ENMT: Mucous membranes moist, poor dentition but no oral bleeding noted.  No signs of epistaxis.   NECK: supple no meningeal signs SPINE:entire spine nontender CV: S1/S2 noted, no murmurs/rubs/gallops noted LUNGS:coarse BS noted but no apparent distress ABDOMEN: soft, nontender, no rebound or guarding GU:no cva tenderness NEURO: Pt is awake/alert, moves all extremitiesx4 EXTREMITIES: pulses normal, full ROM, no calf tenderness noted SKIN: warm, color normal PSYCH: no abnormalities of mood noted  ED Course  Procedures  Labs Reviewed - No data to display Dg Chest 2 View  05/28/2012  *RADIOLOGY REPORT*  Clinical Data: Hemoptysis.  Remote history of tuberculosis.  CHEST - 2 VIEW  Comparison: CTA chest 11/13/2008.  One-view chest x-ray 03/14/2008, 02/22/2008 and two-view chest x-ray 07/23/2007 [facility].  Findings: Pleuroparenchymal scarring and bronchiectasis involving the right lung apex, unchanged.  Parenchymal scarring in the left  upper lobe, unchanged.  Parenchymal scarring at the right lung base, unchanged.  No new pulmonary parenchymal abnormalities. Cardiac silhouette upper normal in size, unchanged.  Thoracic aorta tortuous, unchanged.  Hilar and mediastinal contours otherwise unremarkable.  Tracheal deviation to the right due to the right apical pleuroparenchymal scarring, unchanged.  Visualized bony thorax intact.  IMPRESSION: Stable pleuroparenchymal scarring and bronchiectasis in the right lung apex.  Stable parenchymal  scarring in the left upper lobe and at the right lung base.  No new/acute cardiopulmonary disease.   Original Report Authenticated By: Arnell Sieving, M.D.     10:17 AM Pt with shortness of breath and chest pain, reports orthopnea, CHF is possibility, BNP sent Also will check inr as well as h/o dvt.  May require CT imaging as had recent CXR that was unchanged I used phone interpreter and family for interview (nepalese).  Pt is in no distress  12:07 PM Pt with small amt of hemoptysis here.  Will obtain CT imaging of chest given hemoptysis.  Stable at this time  1:44 PM No PE but recurrent hemoptysis that is worsening, on coumadin, has poor followup ([facility]) Will admit D/w dr Brien Few, to admit ?bronchitis, abx ordered    MDM  Nursing notes including past medical history and social history reviewed and considered in documentation Previous records reviewed and considered - recent evaluation with negative CXR Labs/vital reviewed and considered Interpreter used on phone       Date: 05/30/2012  Rate: 87  Rhythm: normal sinus rhythm  QRS Axis: normal  Intervals: normal  ST/T Wave abnormalities: normal  Conduction Disutrbances:none  Narrative Interpretation:   Old EKG Reviewed: none available at time of interpretation    Joya Gaskins, MD 05/30/12 1345

## 2012-05-30 NOTE — Consult Note (Signed)
Name: Ashley Jacobs MRN: [id number] DOB: 1955/12/16    LOS: 0  PULMONARY / CRITICAL CARE MEDICINE  HPI:   56 years old [name] with PMH relevant for tuberculosis diagnosed while in [place] 18 years ago. She received 6 months of treatment with good response. She immigrated to the [place] in 2008 and apparently received 4 months of therapy. In 2009 had a DVT and was started on coumadin. As per the patient this was a single episode but unclear if provoked or unprovoked. She is still on coumadin and therapeutic. She presents with 3 days history of hemoptysis that she describes as small amounts at a time but frequently. Sometimes is bloody tinged sputum and sometimes pure blood. She also has some chest pain and SOB but at the tie of my exam looks comfortable on RA. Denies fever, chills, weight loss, N/V/D.  Past Medical History  Diagnosis Date  . Hypertension    History reviewed. No pertinent past surgical history. Prior to Admission medications   Medication Sig Start Date End Date Taking? Authorizing Provider  lisinopril-hydrochlorothiazide (PRINZIDE,ZESTORETIC) 10-12.5 MG per tablet Take 1 tablet by mouth daily.   Yes Historical Provider, MD  traMADol (ULTRAM) 50 MG tablet Take 50 mg by mouth every 6 (six) hours as needed.   Yes Historical Provider, MD  warfarin (COUMADIN) 5 MG tablet Take 5 mg by mouth daily. 5 mg 7 days of the week   Yes Historical Provider, MD   Allergies No Known Allergies  Family History History reviewed. No pertinent family history. Social History  does not have a smoking history on file. She does not have any smokeless tobacco history on file. She reports that she does not drink alcohol or use illicit drugs.  Review Of Systems:  Negative except for what I mentioned in the HPI.   Vital Signs: Temp:  [97.6 F (36.4 C)-98.3 F (36.8 C)] 97.6 F (36.4 C) (09/15 2103) Pulse Rate:  [88-96] 96  (09/15 2103) Resp:  [19-20] 19  (09/15 2103) BP: (124-148)/(85-91)  127/85 mmHg (09/15 2103) SpO2:  [96 %-100 %] 97 % (09/15 2103) Weight:  [143 lb 8.3 oz (65.1 kg)-145 lb 8.1 oz (66 kg)] 145 lb 8.1 oz (66 kg) (09/15 2103)  Physical Examination: General:  Pleasant female patient in no acute distress Neuro:  Awake, alert, oriented x 3,  nonfocal HEENT:  PERRL, pink conjunctivae, moist membranes Neck:  Supple, no JVD   Cardiovascular:  RRR, no M/R/G Lungs:  CTA, no W/R/R Abdomen:  Soft, nontender, nondistended, bowel sounds present Musculoskeletal:  Moves all extremities, no pedal edema Skin:  No rash    Lab 05/30/12 0957 TROPONINI -- LATICACIDVEN -- PROBNP 153.0*   Lab 05/30/12 0957 NA 140 K 3.9 CL 105 CO2 27 BUN 11 CREATININE 0.64 CALCIUM 9.5 MG -- PHOS --   Lab 05/30/12 0957 05/28/12 2045 HGB 12.5 -- HCT 37.3 -- PLT 206 -- INR 2.72* 2.84* APTT -- --   Lab 05/30/12 0957 WBC 5.6 PROCALCITON --   Active Problems:  HYPERTENSION, BENIGN ESSENTIAL  DEEP VENOUS THROMBOPHLEBITIS, LEG, LEFT  Hemoptysis   ASSESSMENT AND PLAN:  56 years old [name] with PMH relevant for tuberculosis 18 years ago, treated for 6 months at that time and then for 4 months here in the [place] in 2008. She has history of DVT in 2009 and unclear if provoked or unprovoked. Currently therapeutic on coumadin presents with 3 days history of hemoptysis. CT angiogram negative for PE. Showed RUL bronchiectasis and  stable chronic scarring. I think the source of the bleeding are the bronchiectasis in the RUL. Although there is no easy answer, I believe at this point the risk of major bleeding on therapeutic coumadin outweighs the risk of recurrent DVT. I do not think at this point her INR needs to be reversed since the patient is currently coughing small amounts of blood, her Hgb is stable and she is hemodynamically stable. If the risk of clotting is of great concern I would recommend a temporary IVC filter.  In summary: 1) Agree with antibiotics. 2) Discontinue  coumadin 3) SCD's 4) Consider temporary IVC filter  5) We will follow AFB and sputum cultures. 6) We will continue to follow.  Overton Mam, M.D. Pulmonary and Critical Care Medicine [facility] Pager: [phone]  05/30/2012, 9:31 PM

## 2012-05-30 NOTE — Progress Notes (Signed)
ANTICOAGULATION CONSULT NOTE - Initial Consult  Pharmacy Consult for Coumadin Indication: history DVT  No Known Allergies  Patient Measurements: Weight: 143 lb 8.3 oz (65.1 kg)  Vital Signs: Temp: 98.1 F (36.7 C) (09/15 1623) Temp src: Oral (09/15 1623) BP: 124/85 mmHg (09/15 1623) Pulse Rate: 88  (09/15 1623)  Labs:  Alvira Philips 05/30/12 0957 05/28/12 2045  HGB 12.5 --  HCT 37.3 --  PLT 206 --  APTT -- --  LABPROT 29.3* 30.3*  INR 2.72* 2.84*  HEPARINUNFRC -- --  CREATININE 0.64 --  CKTOTAL -- --  CKMB -- --  TROPONINI -- --    The CrCl is unknown because both a height and weight (above a minimum accepted value) are required for this calculation.   Medical History: Past Medical History  Diagnosis Date  . Hypertension    Medications:  Prescriptions prior to admission  Medication Sig Dispense Refill  . lisinopril-hydrochlorothiazide (PRINZIDE,ZESTORETIC) 10-12.5 MG per tablet Take 1 tablet by mouth daily.      . traMADol (ULTRAM) 50 MG tablet Take 50 mg by mouth every 6 (six) hours as needed.      . warfarin (COUMADIN) 5 MG tablet Take 5 mg by mouth daily. 5 mg 7 days of the week       Assessment: 56 yo female admitted with cough and hemoptysis. Pharmacy consulted to manage Coumadin for hx DVT. CT angio is negative for PE. INR is therapeutic and patient has already taken her dose today. CBC is normal at baseline. Patient takes Coumadin 5 mg daily.  Goal of Therapy:  INR 2-3 Monitor platelets by anticoagulation protocol: Yes   Plan:  -No Coumadin tonight -INR daily -Monitor for s/sx bleeding   [facility], Pharm.D., BCPS Clinical Pharmacist Pager: [phone] 05/30/2012 4:30 PM

## 2012-05-30 NOTE — ED Notes (Signed)
[place] interpreter [phone] used.

## 2012-05-30 NOTE — Progress Notes (Signed)
   Comment:  56 year old [name], with history of previous DVT and Coumadin anticoagulation, prwesenting with few days history of cough and hemoptysis. She was seen at [facility] on 05/28/12, and CXR showed no acute findings, although she had chronic apical scarring/bronchiectasis. CTA on 05/30/12-Neg for PE or acute disease. INR is 2.72 and she is afebrile, with normal wcc. Suspect acute bronchitis.   C. Kymari Lollis. MD, FACP.

## 2012-05-30 NOTE — Progress Notes (Signed)
Transferred patient to 6N32 per wheelchair. Stable. Alert and oriented x4. Report given to [name]

## 2012-05-31 DIAGNOSIS — M545 Low back pain: Secondary | ICD-10-CM

## 2012-05-31 DIAGNOSIS — N926 Irregular menstruation, unspecified: Secondary | ICD-10-CM

## 2012-05-31 LAB — COMPREHENSIVE METABOLIC PANEL
ALT: 36 U/L — ABNORMAL HIGH (ref 0–35)
AST: 29 U/L (ref 0–37)
Albumin: 3.1 g/dL — ABNORMAL LOW (ref 3.5–5.2)
Alkaline Phosphatase: 87 U/L (ref 39–117)
BUN: 14 mg/dL (ref 6–23)
CO2: 26 mEq/L (ref 19–32)
Calcium: 9.3 mg/dL (ref 8.4–10.5)
Chloride: 105 mEq/L (ref 96–112)
Creatinine, Ser: 0.74 mg/dL (ref 0.50–1.10)
GFR calc Af Amer: >90 mL/min (ref >90)
GFR calc non Af Amer: >90 mL/min (ref >90)
Glucose, Bld: 116 mg/dL — ABNORMAL HIGH (ref 70–99)
Potassium: 3.7 mEq/L (ref 3.5–5.1)
Sodium: 139 mEq/L (ref 135–145)
Total Bilirubin: 0.3 mg/dL (ref 0.3–1.2)
Total Protein: 7.2 g/dL (ref 6.0–8.3)

## 2012-05-31 LAB — PROTIME-INR
INR: 2.87 — ABNORMAL HIGH (ref 0.00–1.49)
Prothrombin Time: 30.5 seconds — ABNORMAL HIGH (ref 11.6–15.2)

## 2012-05-31 LAB — VITAMIN B12: Vitamin B-12: 627 pg/mL (ref 211–911)

## 2012-05-31 MED ORDER — TRAMADOL HCL 50 MG PO TABS
50.0000 mg | ORAL_TABLET | Freq: Three times a day (TID) | ORAL | Status: DC
  Administered 2012-05-31 – 2012-06-03 (×8): 50 mg via ORAL
  Filled 2012-05-31 (×12): qty 1

## 2012-05-31 NOTE — Progress Notes (Signed)
Name: Ashley Jacobs MRN: [id number] DOB: [birth year]    LOS: 1  PULMONARY / CRITICAL CARE MEDICINE  HPI:   56 years old [name] with PMH relevant for tuberculosis diagnosed while in [place] 18 years ago (6 mo therapy, repeated again when immigrated to [place] in 2008 for 4 months), DVT (09) on coumadin presented to [facility] on 9/15 with a 3 day hx of hemoptysis, small frequent amounts, sometime bloody tinged and some pure blood, chest pain & SOB.  CT angiogram negative for PE, demonstrated RUL bronchiectasis.  .    Subjective:  Reports bleeding has slowed, no acute events   Vital Signs: Temp:  [97.5 F (36.4 C)-98.1 F (36.7 C)] 98 F (36.7 C) (09/16 1430) Pulse Rate:  [81-96] 82  (09/16 1430) Resp:  [18-20] 18  (09/16 1430) BP: (124-135)/(85-88) 128/88 mmHg (09/16 1430) SpO2:  [96 %-100 %] 100 % (09/16 1430) Weight:  [143 lb 8.3 oz (65.1 kg)-145 lb 8.1 oz (66 kg)] 145 lb 8.1 oz (66 kg) (09/15 2103)  Physical Examination: General:  Pleasant female patient in no acute distress Neuro:  Awake, alert, oriented x 3,  nonfocal HEENT:  PERRL, pink conjunctivae, moist membranes Neck:  Supple, no JVD   Cardiovascular:  RRR, no M/R/G Lungs:  CTA, no W/R/R Abdomen:  Soft, nontender, nondistended, bowel sounds present Musculoskeletal:  Moves all extremities, no pedal edema Skin:  No rash  CBC  Lab 05/30/12 0957  HGB 12.5  HCT 37.3  WBC 5.6  PLT 206    Lab 05/31/12 0637 05/30/12 0957  NA 139 140  K 3.7 3.9  CL 105 105  CO2 26 27  GLUCOSE 116* 94  BUN 14 11  CREATININE 0.74 0.64  CALCIUM 9.3 9.5  MG -- --  PHOS -- --     Active Problems:  HYPERTENSION, BENIGN ESSENTIAL  DEEP VENOUS THROMBOPHLEBITIS, LEG, LEFT  Hemoptysis   ASSESSMENT AND PLAN:  56 years old [name] with PMH relevant for tuberculosis 18 years ago, treated for 6 months at that time and then for 4 months here in the [place] in 2008. She has history of DVT in 2009 and unclear if provoked or  unprovoked. Currently therapeutic on coumadin presents with 3 days history of hemoptysis. CT angiogram negative for PE. Showed RUL bronchiectasis and stable chronic scarring. Likely the source of the bleeding are the bronchiectasis in the RUL.    Plan: -Agree with antibiotics. -Discontinue coumadin, no reversal of INR.  ?? Timing of restart if at all -SCD's   - follow up AFB and sputum cultures. - Venous dopplers should be repeated now to decide whether needs filter but believe too high of a risk of recurrent hemoptysis relative to benefit if just has chronic dvt and nl RV fnx. So> check venous dopplers and echo    [name], NP-C [facility] Pgr: [phone] or [phone]  Pt independently  seen and examined and available cxr's reviewed and I agree with above findings/ imp/ plan    Sandrea Hughs, MD Pulmonary and Critical Care Medicine [facility] Cell [phone]     05/31/2012, 3:02 PM

## 2012-05-31 NOTE — Progress Notes (Signed)
Subjective: Patient seen and examined, c/o b/l subcostal region.  Objective: Vital signs in last 24 hours: Temp:  [97.5 F (36.4 C)-98.1 F (36.7 C)] 98 F (36.7 C) (09/16 1430) Pulse Rate:  [81-96] 82  (09/16 1430) Resp:  [18-20] 18  (09/16 1430) BP: (124-135)/(85-88) 128/88 mmHg (09/16 1430) SpO2:  [96 %-100 %] 100 % (09/16 1430) Weight:  [65.1 kg (143 lb 8.3 oz)-66 kg (145 lb 8.1 oz)] 66 kg (145 lb 8.1 oz) (09/15 2103) Weight change:     Intake/Output from previous day: 09/15 0701 - 09/16 0700 In: 425 [P.O.:100; I.V.:75; IV Piggyback:250] Out: -  Total I/O In: 837 [P.O.:240; I.V.:597] Out: -    Physical Exam: Head: Normocephalic, atraumatic.  Eyes: No signs of jaundice, EOMI Nose: Mucous membranes dry.  Neck: supple,No deformities, masses, or tenderness noted. Lungs: Normal respiratory effort. B/L Clear to auscultation, no crackles or wheezes.  Heart: Regular RR. S1 and S2 normal  Abdomen: BS normoactive. Soft, Nondistended, non-tender.  Extremities: No pretibial edema, no erythema   Lab Results: Basic Metabolic Panel:  Basename 05/31/12 0637 05/30/12 0957  NA 139 140  K 3.7 3.9  CL 105 105  CO2 26 27  GLUCOSE 116* 94  BUN 14 11  CREATININE 0.74 0.64  CALCIUM 9.3 9.5  MG -- --  PHOS -- --   Liver Function Tests:  [facility] 05/31/12 0637  AST 29  ALT 36*  ALKPHOS 87  BILITOT 0.3  PROT 7.2  ALBUMIN 3.1*   No results found for this basename: LIPASE:2,AMYLASE:2 in the last 72 hours No results found for this basename: AMMONIA:2 in the last 72 hours CBC:  Basename 05/30/12 0957  WBC 5.6  NEUTROABS 3.4  HGB 12.5  HCT 37.3  MCV 88.4  PLT 206   Cardiac Enzymes: No results found for this basename: CKTOTAL:3,CKMB:3,CKMBINDEX:3,TROPONINI:3 in the last 72 hours BNP:  Basename 05/30/12 0957  PROBNP 153.0*   D-Dimer: No results found for this basename: DDIMER:2 in the last 72 hours CBG: No results found for this basename: GLUCAP:6 in the last 72  hours Hemoglobin A1C: No results found for this basename: HGBA1C in the last 72 hours Fasting Lipid Panel: No results found for this basename: CHOL,HDL,LDLCALC,TRIG,CHOLHDL,LDLDIRECT in the last 72 hours Thyroid Function Tests: No results found for this basename: TSH,T4TOTAL,FREET4,T3FREE,THYROIDAB in the last 72 hours Anemia Panel:  Basename 05/30/12 1810  VITAMINB12 627  FOLATE --  FERRITIN --  TIBC --  IRON --  RETICCTPCT --   Coagulation:  Basename 05/31/12 0637 05/30/12 0957  LABPROT 30.5* 29.3*  INR 2.87* 2.72*   Urine Drug Screen: Drugs of Abuse  No results found for this basename: labopia, cocainscrnur, labbenz, amphetmu, thcu, labbarb     No results found for this or any previous visit (from the past 240 hour(s)).  Studies/Results: Ct Angio Chest Pe W/cm &/or Wo Cm  05/30/2012  *RADIOLOGY REPORT*  Clinical Data: Chest pain with hemoptysis.  History of hypertension.  Question pulmonary embolism.  CT ANGIOGRAPHY CHEST  Technique:  Multidetector CT imaging of the chest using the standard protocol during bolus administration of intravenous contrast. Multiplanar reconstructed images including MIPs were obtained and reviewed to evaluate the vascular anatomy.  Contrast:  60 ml Omnipaque-300 intravenously.  Comparison: Radiographs 05/28/2012.  CT 11/13/2008.  Findings: The pulmonary arteries are well opacified with contrast. There is no evidence of acute pulmonary embolism.  There is stable mild ectasia of the ascending aorta.  There is stable bilateral superior hilar retraction with  chronic right upper lobe volume loss, parenchymal calcification and bronchiectasis consistent with history of tuberculosis.  No superimposed air-fluid levels are identified.  There is stable right lung nodularity, measuring 6 mm in the lower lobe (image 24), 7 mm in the middle lobe (image 29) and 5 mm in the lower lobe (image 32).  Lesser parenchymal scarring and nodularity in the left lung also appears  stable.  No endobronchial lesion is seen.  Low-density left thyroid nodule has decreased in size, measuring 2.6 cm on image #7.  There is improved tracheal deviation to the right.  Mild nodal prominence in the lower right paratracheal region is stable.  There is no progressive adenopathy.  There is no pleural or pericardial effusion.  Images through the upper abdomen demonstrate diffuse hepatic steatosis and mildly prominent lymph nodes within the porta hepatis, not previously imaged.  IMPRESSION:  1.  No evidence of acute pulmonary embolism. 2.  Grossly stable chronic changes consistent with old tuberculosis infection.  There is scattered nodularity with chronic volume loss and bronchiectasis in the right upper lobe. 3.  No definite acute superimposed inflammatory or neoplastic process identified. 4.  Hepatic steatosis.   Original Report Authenticated By: Gerrianne Scale, M.D.     Medications: Scheduled Meds:   . hydrochlorothiazide  12.5 mg Oral Daily  . influenza  inactive virus vaccine  0.5 mL Intramuscular Once  . lisinopril  10 mg Oral Daily  . moxifloxacin  400 mg Intravenous Q24H  . sodium chloride  3 mL Intravenous Q12H  . traMADol  50 mg Oral Q8H  . DISCONTD: lisinopril-hydrochlorothiazide  1 tablet Oral Daily  . DISCONTD: Warfarin - Pharmacist Dosing Inpatient   Does not apply q1800   Continuous Infusions:   . sodium chloride 75 mL/hr at 05/30/12 1836   PRN Meds:.sodium chloride, acetaminophen, acetaminophen, sodium chloride, DISCONTD: traMADol  Assessment/Plan:  Active Problems:  HYPERTENSION, BENIGN ESSENTIAL  DEEP VENOUS THROMBOPHLEBITIS, LEG, LEFT  Hemoptysis  Hemoptysis Sec to bronchiectasis vsTB Will continue with avelox  DVT  Off coumadin D/W Brandi PA for pulmonary, will check doppler ultrasound, and if negative will not restart the coumadin.  H/o TB Follow AFB   LOS: 1 day   [facility] Pager: [phone] 05/31/2012, 4:07 PM

## 2012-05-31 NOTE — Consult Note (Deleted)
Name: Ashley Jacobs MRN: [id number] DOB: 1956-04-04    LOS: 1  PULMONARY / CRITICAL CARE MEDICINE  HPI:   56 years old [name] with PMH relevant for tuberculosis diagnosed while in [place] 18 years ago (6 mo therapy, repeated again when immigrated to [place] in 2008 for 4 months), DVT (09) on coumadin presented to [facility] on 9/15 with a 3 day hx of hemoptysis, small frequent amounts, sometime bloody tinged and some pure blood, chest pain & SOB.  CT angiogram negative for PE, demonstrated RUL bronchiectasis.  .    Subjective:  Reports bleeding has slowed, no acute events   Vital Signs: Temp:  [97.5 F (36.4 C)-98.1 F (36.7 C)] 97.5 F (36.4 C) (09/16 0505) Pulse Rate:  [81-96] 81  (09/16 0505) Resp:  [18-20] 18  (09/16 0505) BP: (124-135)/(85) 135/85 mmHg (09/16 0505) SpO2:  [96 %-100 %] 100 % (09/16 0505) Weight:  [143 lb 8.3 oz (65.1 kg)-145 lb 8.1 oz (66 kg)] 145 lb 8.1 oz (66 kg) (09/15 2103)  Physical Examination: General:  Pleasant female patient in no acute distress Neuro:  Awake, alert, oriented x 3,  nonfocal HEENT:  PERRL, pink conjunctivae, moist membranes Neck:  Supple, no JVD   Cardiovascular:  RRR, no M/R/G Lungs:  CTA, no W/R/R Abdomen:  Soft, nontender, nondistended, bowel sounds present Musculoskeletal:  Moves all extremities, no pedal edema Skin:  No rash  CBC  Lab 05/30/12 0957  HGB 12.5  HCT 37.3  WBC 5.6  PLT 206    Lab 05/31/12 0637 05/30/12 0957  NA 139 140  K 3.7 3.9  CL 105 105  CO2 26 27  GLUCOSE 116* 94  BUN 14 11  CREATININE 0.74 0.64  CALCIUM 9.3 9.5  MG -- --  PHOS -- --     Active Problems:  HYPERTENSION, BENIGN ESSENTIAL  DEEP VENOUS THROMBOPHLEBITIS, LEG, LEFT  Hemoptysis   ASSESSMENT AND PLAN:  56 years old [name] with PMH relevant for tuberculosis 18 years ago, treated for 6 months at that time and then for 4 months here in the [place] in 2008. She has history of DVT in 2009 and unclear if provoked or unprovoked.  Currently therapeutic on coumadin presents with 3 days history of hemoptysis. CT angiogram negative for PE. Showed RUL bronchiectasis and stable chronic scarring. Likely the source of the bleeding are the bronchiectasis in the RUL.    Plan: -Agree with antibiotics. -Discontinue coumadin, no reversal of INR.  ?? Timing of restart if at all -SCD's -Consider temporary IVC filter if believe at increased risk for clotting (unclear why she was on coumadin since 09 for isolated DVT)  -We will follow AFB and sputum cultures.    [name], NP-C [place] [facility] Pgr: [phone] or [phone]   05/31/2012, 2:51 PM

## 2012-06-01 DIAGNOSIS — R042 Hemoptysis: Principal | ICD-10-CM

## 2012-06-01 DIAGNOSIS — I82409 Acute embolism and thrombosis of unspecified deep veins of unspecified lower extremity: Secondary | ICD-10-CM

## 2012-06-01 DIAGNOSIS — R0602 Shortness of breath: Secondary | ICD-10-CM

## 2012-06-01 LAB — EXPECTORATED SPUTUM ASSESSMENT W GRAM STAIN, RFLX TO RESP C

## 2012-06-01 MED ORDER — CYCLOBENZAPRINE HCL 5 MG PO TABS
5.0000 mg | ORAL_TABLET | Freq: Three times a day (TID) | ORAL | Status: DC
  Administered 2012-06-01 – 2012-06-03 (×6): 5 mg via ORAL
  Filled 2012-06-01 (×10): qty 1

## 2012-06-01 MED ORDER — IBUPROFEN 600 MG PO TABS
600.0000 mg | ORAL_TABLET | Freq: Four times a day (QID) | ORAL | Status: DC | PRN
  Filled 2012-06-01: qty 1

## 2012-06-01 NOTE — Progress Notes (Signed)
Name: Ashley Jacobs MRN: [id number] DOB: 03/11/1956    LOS: 2  PULMONARY / CRITICAL CARE MEDICINE  HPI:   56 years old [name] with PMH relevant for tuberculosis diagnosed while in [place] 18 years ago (6 mo therapy, repeated again when immigrated to [place] in 2008 for 4 months), DVT (09) on coumadin presented to [facility] on 9/15 with a 3 day hx of hemoptysis, small frequent amounts, sometime bloody tinged and some pure blood, chest pain & SOB.  CT angiogram negative for PE, demonstrated RUL bronchiectasis.  .   Subjective:  RN reports no acute events.     Vital Signs: Temp:  [98 F (36.7 C)-98.6 F (37 C)] 98.4 F (36.9 C) (09/17 0631) Pulse Rate:  [56-82] 56  (09/17 0631) Resp:  [18-20] 20  (09/17 0631) BP: (111-131)/(57-88) 111/57 mmHg (09/17 0631) SpO2:  [94 %-100 %] 98 % (09/17 0631)  Physical Examination: General:  Pleasant female patient in no acute distress Neuro:  Awake, alert, oriented x 3,  nonfocal HEENT:  PERRL, pink conjunctivae, moist membranes Neck:  Supple, no JVD   Cardiovascular:  RRR, no M/R/G Lungs:  CTA, no W/R/R Abdomen:  Soft, nontender, nondistended, bowel sounds present Musculoskeletal:  Moves all extremities, no pedal edema Skin:  No rash  CBC  Lab 05/30/12 0957  HGB 12.5  HCT 37.3  WBC 5.6  PLT 206    Lab 05/31/12 0637 05/30/12 0957  NA 139 140  K 3.7 3.9  CL 105 105  CO2 26 27  GLUCOSE 116* 94  BUN 14 11  CREATININE 0.74 0.64  CALCIUM 9.3 9.5  MG -- --  PHOS -- --     Active Problems:  HYPERTENSION, BENIGN ESSENTIAL  DEEP VENOUS THROMBOPHLEBITIS, LEG, LEFT  Hemoptysis  CTA Chest 9/15>>>No evidence of acute pulmonary embolism.  Grossly stable chronic changes consistent with old tuberculosis infection. There is scattered nodularity with chronic volume loss and bronchiectasis in the right upper lobe.  No definite acute superimposed inflammatory or neoplastic process identified. Hepatic steatosis  LE Venous US 9/17>>>Left: DVT  noted in the common femoral and femoral veins. No evidence of superficial thrombosis. No Baker's cyst. Right: Negative for DVT, SVT, or a Baker's cyst.   Sputum for AFB x3>>>  ASSESSMENT AND PLAN:  56 years old [name] with PMH relevant for tuberculosis 18 years ago, treated for 6 months at that time and then for 4 months here in the [place] in 2008. She has history of DVT in 2009 and unclear if provoked or unprovoked. Currently therapeutic on coumadin presents with 3 days history of hemoptysis. CT angiogram negative for PE. Showed RUL bronchiectasis and stable chronic scarring. Likely the source of the bleeding are the bronchiectasis in the RUL.   Hemoptysis - resolved off coumadin DVT - known hx and confirmed DVT on [place] 9/17  Plan: -Agree with antibiotics. -Discontinue coumadin, no reversal of INR.  ?? Timing of restart if at all -SCD's -follow up AFB and sputum cultures. -Venous dopplers positive --  needs retrievable filter in setting of hemoptysis, bronchiectasis.  (updated Dr. Sharl Ma) -not sure ECHO offers much here given negative CTA, -->will cancel -PT / INR in am  [name], NP-C [place] [facility] Pgr: [phone] or [phone]     I have seen and examined this pt with [facility] NP . I agree with the plan as outlined above. I spoke to the pt in detail using [place] interpreter and she understands our recommendations and  plans.  Dorcas Carrow Beeper  [phone]  Cell  [phone]  If no response or cell goes to voicemail, call beeper [phone]  06/01/2012, 1:34 PM

## 2012-06-01 NOTE — H&P (Addendum)
Cc: DVT, hemoptysis in setting of coumadin. Request has been made for IVC filter placement.    HPI:  (per pulmonary note )  56 years old [name] with PMH relevant for tuberculosis diagnosed while in [place] 18 years ago (6 mo therapy, repeated again when immigrated to [place] in 2008 for 4 months), DVT (09) on coumadin presented to [facility] on 9/15 with a 3 day hx of hemoptysis, small frequent amounts, sometime bloody tinged and some pure blood, chest pain & SOB.  CT angiogram negative for PE, demonstrated RUL bronchiectasis.  .  Vital Signs: Temp:  [98 F (36.7 C)-98.6 F (37 C)] 98.4 F (36.9 C) (09/17 0631) Pulse Rate:  [56-82] 56  (09/17 0631) Resp:  [18-20] 20  (09/17 0631) BP: (111-131)/(57-88) 111/57 mmHg (09/17 0631) SpO2:  [94 %-100 %] 98 % (09/17 0631)  Physical Examination: General:  Pleasant female patient in no acute distress Neuro:  Awake, alert, oriented x 3,  nonfocal HEENT:  PERRL, pink conjunctivae, moist membranes Neck:  Supple, no JVD    Cardiovascular:  RRR, no M/R/G Lungs:  CTA, no W/R/R Abdomen:  Soft, nontender, nondistended, bowel sounds present Musculoskeletal:  Moves all extremities, no pedal edema Skin:  No rash  CBC  Lab  05/30/12 0957   HGB  12.5   HCT  37.3   WBC  5.6   PLT  206     Lab  05/31/12 0637  05/30/12 0957   NA  139  140   K  3.7  3.9   CL  105  105   CO2  26  27   GLUCOSE  116*  94   BUN  14  11   CREATININE  0.74  0.64   CALCIUM  9.3  9.5   MG  --  --   PHOS  --  --      Active Problems:  HYPERTENSION, BENIGN ESSENTIAL  DEEP VENOUS THROMBOPHLEBITIS, LEG, LEFT  Hemoptysis  CTA Chest 9/15>>>No evidence of acute pulmonary embolism.  Grossly stable chronic changes consistent with old tuberculosis infection. There is scattered nodularity with chronic volume loss and bronchiectasis in the right upper lobe.   No definite acute superimposed inflammatory or neoplastic process identified. Hepatic steatosis  LE Venous US  9/17>>>Left: DVT noted in the common femoral and femoral veins. No evidence of superficial thrombosis. No Baker's cyst. Right: Negative for DVT, SVT, or a Baker's cyst.  Sputum for AFB x3>>>  ASSESSMENT AND PLAN:  56 years old [name] with PMH relevant for tuberculosis 18 years ago, treated for 6 months at that time and then for 4 months here in the [place] in 2008. She has history of DVT in 2009 and unclear if provoked or unprovoked. Currently therapeutic on coumadin presents with 3 days history of hemoptysis. CT angiogram negative for PE. Showed RUL bronchiectasis and stable chronic scarring. Likely the source of the bleeding are the bronchiectasis in the RUL.   Hemoptysis - resolved off coumadin DVT - known hx and confirmed DVT on [place] 9/17  Plan: -Agree with antibiotics. -Discontinue coumadin, no reversal of INR.  ?? Timing of restart if at all -SCD's -follow up AFB and sputum cultures. -Venous dopplers positive --  needs retrievable filter in setting of hemoptysis, bronchiectasis.  (updated Dr. Sharl Ma) -not sure ECHO offers much here given negative CTA, -->will cancel -PT / INR in am  [name], NP-C [place] [facility] Pgr: [phone] or [phone]   A/P:  History  reviewed and patient is appropriate for retrievable IVC filter placement tentatively planned for 06/02/12 if INR within levels to safely proceed. Will recheck am levels and proceed accordingly. Orders placed at this time in anticipation of placement tomorrow.   Lab results 06/02/12 :  Results for LAURY, NYDEGGER (MRN [id number]) as of 06/02/2012 08:15  Ref. Range 06/02/2012 05:30  Sodium Latest Range: 135-145 mEq/L 138  Potassium Latest Range: 3.5-5.1 mEq/L 3.8  Chloride Latest Range: 96-112 mEq/L 105  CO2 Latest Range: 19-32 mEq/L 28  BUN Latest Range: 6-23 mg/dL 17  Creatinine Latest Range: 0.50-1.10 mg/dL 4.09  Calcium Latest Range: 8.4-10.5 mg/dL 8.8  GFR calc non Af Amer Latest Range: >90 mL/min 78  (L)  GFR calc Af Amer Latest Range: >90 mL/min >90  Glucose Latest Range: 70-99 mg/dL 811 (H)  WBC Latest Range: 4.0-10.5 K/uL 5.7  RBC Latest Range: 3.87-5.11 MIL/uL 3.76 (L)  Hemoglobin Latest Range: 12.0-15.0 g/dL 91.4 (L)  HCT Latest Range: 36.0-46.0 % 34.0 (L)  MCV Latest Range: 78.0-100.0 fL 90.4  MCH Latest Range: 26.0-34.0 pg 30.3  MCHC Latest Range: 30.0-36.0 g/dL 78.2  RDW Latest Range: 11.5-15.5 % 13.7  Platelets Latest Range: 150-400 K/uL 192  Prothrombin Time Latest Range: 11.6-15.2 seconds 21.1 (H)  INR Latest Range: 0.00-1.49  1.90 (H)    06/02/12:  [place] interpretor assistance used to discuss IVC filter placement details, reasons for same and potential complications. All questions answered for patient and her son to their satisfaction. Plan for retrievable filter placement later today in IR. PE findings without change from that above.

## 2012-06-01 NOTE — Progress Notes (Signed)
Subjective: Patient seen and examined, still c/o pain in  b/l subcostal region .  Objective: Vital signs in last 24 hours: Temp:  [98 F (36.7 C)-98.6 F (37 C)] 98.4 F (36.9 C) (09/17 0631) Pulse Rate:  [56-82] 56  (09/17 0631) Resp:  [18-20] 20  (09/17 0631) BP: (111-131)/(57-88) 111/57 mmHg (09/17 0631) SpO2:  [94 %-100 %] 98 % (09/17 0631) Weight change:     Intake/Output from previous day: 09/16 0701 - 09/17 0700 In: 1680 [P.O.:480; I.V.:1200] Out: -      Physical Exam: Head: Normocephalic, atraumatic.  Eyes: No signs of jaundice, EOMI Nose: Mucous membranes dry.  Neck: supple,No deformities, masses, or tenderness noted. Chest :Positive subcostal tenderness, increased on deep breathing Lungs: Normal respiratory effort. B/L Clear to auscultation, no crackles or wheezes.  Heart: Regular RR. S1 and S2 normal  Abdomen: BS normoactive. Soft, Nondistended, non-tender.  Extremities: No pretibial edema, no erythema   Lab Results: Basic Metabolic Panel:  Basename 05/31/12 0637 05/30/12 0957  NA 139 140  K 3.7 3.9  CL 105 105  CO2 26 27  GLUCOSE 116* 94  BUN 14 11  CREATININE 0.74 0.64  CALCIUM 9.3 9.5  MG -- --  PHOS -- --   Liver Function Tests:  [facility] 05/31/12 0637  AST 29  ALT 36*  ALKPHOS 87  BILITOT 0.3  PROT 7.2  ALBUMIN 3.1*   CBC:  Basename 05/30/12 0957  WBC 5.6  NEUTROABS 3.4  HGB 12.5  HCT 37.3  MCV 88.4  PLT 206   Cardiac Enzymes: No results found for this basename: CKTOTAL:3,CKMB:3,CKMBINDEX:3,TROPONINI:3 in the last 72 hours BNP:  Basename 05/30/12 0957  PROBNP 153.0*   Anemia Panel:  Basename 05/30/12 1810  VITAMINB12 627  FOLATE --  FERRITIN --  TIBC --  IRON --  RETICCTPCT --   Coagulation:  Basename 05/31/12 0637 05/30/12 0957  LABPROT 30.5* 29.3*  INR 2.87* 2.72*   Urine Drug Screen: Drugs of Abuse  No results found for this basename: labopia,  cocainscrnur,  labbenz,  amphetmu,  thcu,  labbarb      Recent Results (from the past 240 hour(s))  CULTURE, EXPECTORATED SPUTUM-ASSESSMENT     Status: Normal   Collection Time   06/01/12  5:29 AM      Component Value Range Status Comment   Specimen Description SPUTUM   Final    Special Requests NONE   Final    Sputum evaluation     Final    Value: MICROSCOPIC FINDINGS SUGGEST THAT THIS SPECIMEN IS NOT REPRESENTATIVE OF LOWER RESPIRATORY SECRETIONS. PLEASE RECOLLECT.     CALLED TO CAGUIOA,N RN [id number] 06/01/12 MITCHELL,L   Report Status 06/01/2012 FINAL   Final     Studies/Results: Ct Angio Chest Pe W/cm &/or Wo Cm  05/30/2012  *RADIOLOGY REPORT*  Clinical Data: Chest pain with hemoptysis.  History of hypertension.  Question pulmonary embolism.  CT ANGIOGRAPHY CHEST  Technique:  Multidetector CT imaging of the chest using the standard protocol during bolus administration of intravenous contrast. Multiplanar reconstructed images including MIPs were obtained and reviewed to evaluate the vascular anatomy.  Contrast:  60 ml Omnipaque-300 intravenously.  Comparison: Radiographs 05/28/2012.  CT 11/13/2008.  Findings: The pulmonary arteries are well opacified with contrast. There is no evidence of acute pulmonary embolism.  There is stable mild ectasia of the ascending aorta.  There is stable bilateral superior hilar retraction with chronic right upper lobe volume loss, parenchymal calcification and bronchiectasis consistent with history of  tuberculosis.  No superimposed air-fluid levels are identified.  There is stable right lung nodularity, measuring 6 mm in the lower lobe (image 24), 7 mm in the middle lobe (image 29) and 5 mm in the lower lobe (image 32).  Lesser parenchymal scarring and nodularity in the left lung also appears stable.  No endobronchial lesion is seen.  Low-density left thyroid nodule has decreased in size, measuring 2.6 cm on image #7.  There is improved tracheal deviation to the right.  Mild nodal prominence in the lower right paratracheal  region is stable.  There is no progressive adenopathy.  There is no pleural or pericardial effusion.  Images through the upper abdomen demonstrate diffuse hepatic steatosis and mildly prominent lymph nodes within the porta hepatis, not previously imaged.  IMPRESSION:  1.  No evidence of acute pulmonary embolism. 2.  Grossly stable chronic changes consistent with old tuberculosis infection.  There is scattered nodularity with chronic volume loss and bronchiectasis in the right upper lobe. 3.  No definite acute superimposed inflammatory or neoplastic process identified. 4.  Hepatic steatosis.   Original Report Authenticated By: Gerrianne Scale, M.D.     Medications: Scheduled Meds:    . hydrochlorothiazide  12.5 mg Oral Daily  . lisinopril  10 mg Oral Daily  . moxifloxacin  400 mg Intravenous Q24H  . sodium chloride  3 mL Intravenous Q12H  . traMADol  50 mg Oral Q8H   Continuous Infusions:    . sodium chloride 1,000 mL (06/01/12 0811)   PRN Meds:.sodium chloride, acetaminophen, acetaminophen, sodium chloride, DISCONTD: traMADol  Assessment/Plan:  Active Problems:  HYPERTENSION, BENIGN ESSENTIAL  DEEP VENOUS THROMBOPHLEBITIS, LEG, LEFT  Hemoptysis  Hemoptysis Sec to bronchiectasis vsTB Will continue with avelox  DVT  Off coumadin D/W Brandi PA for pulmonary, will check doppler ultrasound, and if negative will not restart the coumadin.  Hypertension BP is stable Continue lisinopril/HCTZ 2 D echo is pending Follow echo report  Subcostal pain ? Muscular/ ? pleural Start Flexeril 5 mg po tid  NSAIDs prn  H/o TB Follow AFB   LOS: 2 days   [facility] Pager: [phone] 06/01/2012, 10:33 AM

## 2012-06-01 NOTE — Progress Notes (Signed)
Left: DVT noted in the common femoral and femoral veins.  No evidence of superficial thrombosis.  No Baker's cyst.  Right:  Negative for DVT, SVT, or a Baker's cyst.

## 2012-06-02 ENCOUNTER — Inpatient Hospital Stay (HOSPITAL_COMMUNITY): Payer: Medicaid Other

## 2012-06-02 DIAGNOSIS — I82509 Chronic embolism and thrombosis of unspecified deep veins of unspecified lower extremity: Secondary | ICD-10-CM

## 2012-06-02 DIAGNOSIS — D689 Coagulation defect, unspecified: Secondary | ICD-10-CM

## 2012-06-02 HISTORY — PX: VENA CAVA FILTER PLACEMENT: SUR1032

## 2012-06-02 LAB — BASIC METABOLIC PANEL
BUN: 17 mg/dL (ref 6–23)
CO2: 28 mEq/L (ref 19–32)
Calcium: 8.8 mg/dL (ref 8.4–10.5)
Chloride: 105 mEq/L (ref 96–112)
Creatinine, Ser: 0.84 mg/dL (ref 0.50–1.10)
Glucose, Bld: 127 mg/dL — ABNORMAL HIGH (ref 70–99)

## 2012-06-02 LAB — CBC
HCT: 34 % — ABNORMAL LOW (ref 36.0–46.0)
MCH: 30.3 pg (ref 26.0–34.0)
MCHC: 33.5 g/dL (ref 30.0–36.0)
MCV: 90.4 fL (ref 78.0–100.0)
Platelets: 192 10*3/uL (ref 150–400)
RDW: 13.7 % (ref 11.5–15.5)
WBC: 5.7 10*3/uL (ref 4.0–10.5)

## 2012-06-02 MED ORDER — MIDAZOLAM HCL 5 MG/5ML IJ SOLN
INTRAMUSCULAR | Status: AC | PRN
  Administered 2012-06-02: 1 mg via INTRAVENOUS

## 2012-06-02 MED ORDER — IOHEXOL 300 MG/ML  SOLN
100.0000 mL | Freq: Once | INTRAMUSCULAR | Status: AC | PRN
  Administered 2012-06-02: 50 mL via INTRAVENOUS

## 2012-06-02 MED ORDER — MOXIFLOXACIN HCL 400 MG PO TABS
400.0000 mg | ORAL_TABLET | Freq: Every day | ORAL | Status: DC
  Administered 2012-06-02: 400 mg via ORAL
  Filled 2012-06-02 (×2): qty 1

## 2012-06-02 MED ORDER — FENTANYL CITRATE 0.05 MG/ML IJ SOLN
INTRAMUSCULAR | Status: AC | PRN
  Administered 2012-06-02: 50 ug via INTRAVENOUS

## 2012-06-02 NOTE — Progress Notes (Signed)
Patient returned to room from radiology. S/P greenfield filter placement. Dressing intact to right neck. Spoke with patient via interpreter line. Patient  denies pain. Family member at bedside.

## 2012-06-02 NOTE — ED Notes (Signed)
1915 Had to step out of room for pictures

## 2012-06-02 NOTE — Progress Notes (Signed)
[facility] PROGRESS NOTE  SUSANA DUELL ZOX:[id number] DOB: 09-24-1955 DOA: 05/30/2012 PCP: Lehman Prom, NP  Assessment/Plan: Chronic DVT left lower extremity -Patient on Coumadin since 2009 -Will need to discuss with son risks, benefits regarding ever restarting Coumadin -IVC filter as planned when INR is appropriate -I tried to call son today, had to leave voicemail Hemoptysis -Hemoglobin remained stable -Patient denies any further hemoptysis -Monitor hemoglobin -appreciate pulmonary follow up History of pulmonary tuberculosis -Isolation discontinued, AFB smears negative -Currently no signs or symptoms of active TB    Procedures/Studies: Dg Chest 2 View  05/28/2012  *RADIOLOGY REPORT*  Clinical Data: Hemoptysis.  Remote history of tuberculosis.  CHEST - 2 VIEW  Comparison: CTA chest 11/13/2008.  One-view chest x-ray 03/14/2008, 02/22/2008 and two-view chest x-ray 07/23/2007 [facility].  Findings: Pleuroparenchymal scarring and bronchiectasis involving the right lung apex, unchanged.  Parenchymal scarring in the left upper lobe, unchanged.  Parenchymal scarring at the right lung base, unchanged.  No new pulmonary parenchymal abnormalities. Cardiac silhouette upper normal in size, unchanged.  Thoracic aorta tortuous, unchanged.  Hilar and mediastinal contours otherwise unremarkable.  Tracheal deviation to the right due to the right apical pleuroparenchymal scarring, unchanged.  Visualized bony thorax intact.  IMPRESSION: Stable pleuroparenchymal scarring and bronchiectasis in the right lung apex.  Stable parenchymal scarring in the left upper lobe and at the right lung base.  No new/acute cardiopulmonary disease.   Original Report Authenticated By: Arnell Sieving, M.D.    Ct Angio Chest Pe W/cm &/or Wo Cm  05/30/2012  *RADIOLOGY REPORT*  Clinical Data: Chest pain with hemoptysis.  History of hypertension.  Question pulmonary embolism.  CT ANGIOGRAPHY CHEST   Technique:  Multidetector CT imaging of the chest using the standard protocol during bolus administration of intravenous contrast. Multiplanar reconstructed images including MIPs were obtained and reviewed to evaluate the vascular anatomy.  Contrast:  60 ml Omnipaque-300 intravenously.  Comparison: Radiographs 05/28/2012.  CT 11/13/2008.  Findings: The pulmonary arteries are well opacified with contrast. There is no evidence of acute pulmonary embolism.  There is stable mild ectasia of the ascending aorta.  There is stable bilateral superior hilar retraction with chronic right upper lobe volume loss, parenchymal calcification and bronchiectasis consistent with history of tuberculosis.  No superimposed air-fluid levels are identified.  There is stable right lung nodularity, measuring 6 mm in the lower lobe (image 24), 7 mm in the middle lobe (image 29) and 5 mm in the lower lobe (image 32).  Lesser parenchymal scarring and nodularity in the left lung also appears stable.  No endobronchial lesion is seen.  Low-density left thyroid nodule has decreased in size, measuring 2.6 cm on image #7.  There is improved tracheal deviation to the right.  Mild nodal prominence in the lower right paratracheal region is stable.  There is no progressive adenopathy.  There is no pleural or pericardial effusion.  Images through the upper abdomen demonstrate diffuse hepatic steatosis and mildly prominent lymph nodes within the porta hepatis, not previously imaged.  IMPRESSION:  1.  No evidence of acute pulmonary embolism. 2.  Grossly stable chronic changes consistent with old tuberculosis infection.  There is scattered nodularity with chronic volume loss and bronchiectasis in the right upper lobe. 3.  No definite acute superimposed inflammatory or neoplastic process identified. 4.  Hepatic steatosis.   Original Report Authenticated By: Gerrianne Scale, M.D.        Code Status: Full  Disposition Plan: Home when medically  stable  Subjective:  Patient is resting comfortably. She denies any chest pain, shortness of breath, vomiting. She complains of some loose stools yesterday. No bowel movements today. Denies any hemoptysis or hematochezia. No fevers or chills.  Objective: Filed Vitals:   06/01/12 0631 06/01/12 2159 06/02/12 0458 06/02/12 1020  BP: 111/57 96/54 92/58  124/76  Pulse: 56 82 65 88  Temp: 98.4 F (36.9 C) 98.4 F (36.9 C) 97.7 F (36.5 C)   TempSrc: Oral Oral Oral   Resp: 20 20 20    Height:      Weight:      SpO2: 98% 95% 97% 94%    Intake/Output Summary (Last 24 hours) at 06/02/12 1041 Last data filed at 06/01/12 1556  Gross per 24 hour  Intake    850 ml  Output      0 ml  Net    850 ml   Weight change:  Exam:   General:  Pt is alert, follows commands appropriately, not in acute distress  HEENT: No icterus, No thrush, No neck mass, [place]/AT  Cardiovascular: Regular rate and rhythm, S1/S2, no rubs, no gallops  Respiratory: Clear to auscultation bilaterally, no wheezing, no crackles, no rhonchi  Abdomen: Soft, non distended, bowel sounds present, no guarding; mild right lower quadrant tender, no peritoneal signs  Extremities: No edema, No lymphangitis, No petechiae, No rashes, no synovitis  Data Reviewed: Basic Metabolic Panel:  Lab 06/02/12 1610 05/31/12 0637 05/30/12 0957  NA 138 139 140  K 3.8 3.7 3.9  CL 105 105 105  CO2 28 26 27   GLUCOSE 127* 116* 94  BUN 17 14 11   CREATININE 0.84 0.74 0.64  CALCIUM 8.8 9.3 9.5  MG -- -- --  PHOS -- -- --   Liver Function Tests:  Lab 05/31/12 0637  AST 29  ALT 36*  ALKPHOS 87  BILITOT 0.3  PROT 7.2  ALBUMIN 3.1*   No results found for this basename: LIPASE:5,AMYLASE:5 in the last 168 hours No results found for this basename: AMMONIA:5 in the last 168 hours CBC:  Lab 06/02/12 0530 05/30/12 0957  WBC 5.7 5.6  NEUTROABS -- 3.4  HGB 11.4* 12.5  HCT 34.0* 37.3  MCV 90.4 88.4  PLT 192 206   Cardiac Enzymes: No  results found for this basename: CKTOTAL:5,CKMB:5,CKMBINDEX:5,TROPONINI:5 in the last 168 hours BNP: No components found with this basename: POCBNP:5 CBG: No results found for this basename: GLUCAP:5 in the last 168 hours  Recent Results (from the past 240 hour(s))  AFB CULTURE WITH SMEAR     Status: Normal (Preliminary result)   Collection Time   05/31/12 11:00 AM      Component Value Range Status Comment   Specimen Description SPUTUM   Final    Special Requests Normal   Final    ACID FAST SMEAR NO ACID FAST BACILLI SEEN   Final    Culture     Final    Value: CULTURE WILL BE EXAMINED FOR 6 WEEKS BEFORE ISSUING A FINAL REPORT   Report Status PENDING   Incomplete   AFB CULTURE WITH SMEAR     Status: Normal (Preliminary result)   Collection Time   06/01/12  5:29 AM      Component Value Range Status Comment   Specimen Description SPUTUM   Final    Special Requests NONE   Final    ACID FAST SMEAR NO ACID FAST BACILLI SEEN   Final    Culture     Final    Value:  CULTURE WILL BE EXAMINED FOR 6 WEEKS BEFORE ISSUING A FINAL REPORT   Report Status PENDING   Incomplete   CULTURE, EXPECTORATED SPUTUM-ASSESSMENT     Status: Normal   Collection Time   06/01/12  5:29 AM      Component Value Range Status Comment   Specimen Description SPUTUM   Final    Special Requests NONE   Final    Sputum evaluation     Final    Value: MICROSCOPIC FINDINGS SUGGEST THAT THIS SPECIMEN IS NOT REPRESENTATIVE OF LOWER RESPIRATORY SECRETIONS. PLEASE RECOLLECT.     CALLED TO CAGUIOA,N RN 1610 06/01/12 MITCHELL,L   Report Status 06/01/2012 FINAL   Final   CULTURE, EXPECTORATED SPUTUM-ASSESSMENT     Status: Normal   Collection Time   06/01/12 11:35 AM      Component Value Range Status Comment   Specimen Description SPUTUM   Final    Special Requests NONE   Final    Sputum evaluation     Final    Value: MICROSCOPIC FINDINGS SUGGEST THAT THIS SPECIMEN IS NOT REPRESENTATIVE OF LOWER RESPIRATORY SECRETIONS. PLEASE  RECOLLECT.     CALLED TO Malen Gauze, RN 06/01/12 1155 BY K SCHULTZ   Report Status 06/01/2012 FINAL   Final      Scheduled Meds:   . cyclobenzaprine  5 mg Oral TID  . hydrochlorothiazide  12.5 mg Oral Daily  . lisinopril  10 mg Oral Daily  . moxifloxacin  400 mg Intravenous Q24H  . sodium chloride  3 mL Intravenous Q12H  . traMADol  50 mg Oral Q8H   Continuous Infusions:   . sodium chloride 1,000 mL (06/01/12 0811)     Rhiley Solem, DO  [facility] Pager [phone]  If 7PM-7AM, please contact night-coverage [email or web address] Password [email or web address] 06/02/2012, 10:41 AM   LOS: 3 days

## 2012-06-02 NOTE — Progress Notes (Signed)
Name: Ashley Jacobs MRN: [id number] DOB: Jul 23, 1956    LOS: 3  PULMONARY / CRITICAL CARE MEDICINE  HPI:   56 years old [name] with PMH relevant for tuberculosis diagnosed while in [place] 18 years ago (6 mo therapy, repeated again when immigrated to [place] in 2008 for 4 months), DVT (09) on coumadin presented to [facility] on 9/15 with a 3 day hx of hemoptysis, small frequent amounts, sometime bloody tinged and some pure blood, chest pain & SOB.  CT angiogram negative for PE, demonstrated RUL bronchiectasis.  .   Subjective:  No new issues. Awaiting INR to drift down so can place IVC filter      Vital Signs: Temp:  [97.7 F (36.5 C)-98.4 F (36.9 C)] 97.7 F (36.5 C) (09/18 0458) Pulse Rate:  [65-82] 65  (09/18 0458) Resp:  [20] 20  (09/18 0458) BP: (92-96)/(54-58) 92/58 mmHg (09/18 0458) SpO2:  [95 %-97 %] 97 % (09/18 0458)  Physical Examination: General:  Pleasant female patient in no acute distress Neuro:  Awake, alert, oriented x 3,  nonfocal HEENT:  PERRL, pink conjunctivae, moist membranes Neck:  Supple, no JVD   Cardiovascular:  RRR, no M/R/G Lungs:  CTA, no W/R/R Abdomen:  Soft, nontender, nondistended, bowel sounds present Musculoskeletal:  Moves all extremities, no pedal edema Skin:  No rash  CBC  Lab 06/02/12 0530 05/30/12 0957  HGB 11.4* 12.5  HCT 34.0* 37.3  WBC 5.7 5.6  PLT 192 206    Lab 06/02/12 0530 05/31/12 0637 05/30/12 0957  NA 138 139 140  K 3.8 3.7 --  CL 105 105 105  CO2 28 26 27   GLUCOSE 127* 116* 94  BUN 17 14 11   CREATININE 0.84 0.74 0.64  CALCIUM 8.8 9.3 9.5  MG -- -- --  PHOS -- -- --    Lab 06/02/12 0530 05/31/12 0637 05/30/12 0957 05/28/12 2045  INR 1.90* 2.87* 2.72* 2.84*     Active Problems:  HYPERTENSION, BENIGN ESSENTIAL  DEEP VENOUS THROMBOPHLEBITIS, LEG, LEFT  Hemoptysis  CTA Chest 9/15>>>No evidence of acute pulmonary embolism.  Grossly stable chronic changes consistent with old tuberculosis infection. There is  scattered nodularity with chronic volume loss and bronchiectasis in the right upper lobe.  No definite acute superimposed inflammatory or neoplastic process identified. Hepatic steatosis  LE Venous US 9/17>>>Left: DVT noted in the common femoral and femoral veins. No evidence of superficial thrombosis. No Baker's cyst. Right: Negative for DVT, SVT, or a Baker's cyst.   Sputum for AFB x3>>>NO AFB SEEN  ASSESSMENT AND PLAN:  56 years old [name] with PMH relevant for tuberculosis 18 years ago, treated for 6 months at that time and then for 4 months here in the [place] in 2008. She has history of DVT in 2009 and unclear if provoked or unprovoked. Currently therapeutic on coumadin presents with 3 days history of hemoptysis. CT angiogram negative for PE. Showed RUL bronchiectasis and stable chronic scarring. Likely the source of the bleeding are the bronchiectasis in the RUL.   Hemoptysis - resolved off coumadin DVT - known hx and confirmed DVT on [place] 9/17  Plan: -Agree with antibiotics. -Discontinue coumadin, no reversal of INR.  ?? Timing of restart if at all -SCD's -follow up AFB and sputum cultures.>>>AFB smears neg, can d/c isolation -Venous dopplers positive --  needs retrievable filter in setting of hemoptysis, bronchiectasis.  - For placement today per IR  Dorcas Carrow Beeper  [phone]  Cell  [phone]  If no  response or cell goes to voicemail, call beeper [phone]  06/02/2012, 7:58 AM

## 2012-06-02 NOTE — Procedures (Signed)
Celect IVC filter L1/2 disk L CIV thrombosis No comp

## 2012-06-03 ENCOUNTER — Encounter (HOSPITAL_COMMUNITY): Payer: Self-pay | Admitting: General Practice

## 2012-06-03 DIAGNOSIS — J479 Bronchiectasis, uncomplicated: Secondary | ICD-10-CM

## 2012-06-03 LAB — BASIC METABOLIC PANEL
CO2: 31 mEq/L (ref 19–32)
Calcium: 9.5 mg/dL (ref 8.4–10.5)
Chloride: 100 mEq/L (ref 96–112)
Glucose, Bld: 81 mg/dL (ref 70–99)
Potassium: 3.9 mEq/L (ref 3.5–5.1)
Sodium: 135 mEq/L (ref 135–145)

## 2012-06-03 LAB — EXPECTORATED SPUTUM ASSESSMENT W GRAM STAIN, RFLX TO RESP C

## 2012-06-03 LAB — CBC
Hemoglobin: 11.8 g/dL — ABNORMAL LOW (ref 12.0–15.0)
MCH: 29.9 pg (ref 26.0–34.0)
MCV: 88.6 fL (ref 78.0–100.0)
Platelets: 187 10*3/uL (ref 150–400)
RBC: 3.95 MIL/uL (ref 3.87–5.11)
WBC: 5.7 10*3/uL (ref 4.0–10.5)

## 2012-06-03 MED ORDER — MOXIFLOXACIN HCL 400 MG PO TABS: 400.0000 mg | ORAL_TABLET | Freq: Every day | ORAL | Status: DC

## 2012-06-03 NOTE — Progress Notes (Signed)
Patient discharged to home with son.  Discharge teaching completed with son including follow up care and medications.  Verbalizes understanding with no further questions.  Vital signs stable, no complaints of pain.

## 2012-06-03 NOTE — ED Provider Notes (Signed)
Medical screening examination/treatment/procedure(s) were conducted as a shared visit with non-physician practitioner(s) and myself.  I personally evaluated the patient during the encounter   Joya Gaskins, MD 06/03/12 1504

## 2012-06-03 NOTE — Care Management Note (Signed)
  Page 1 of 1   06/03/2012     1:47:39 PM   CARE MANAGEMENT NOTE 06/03/2012  Patient:  Ashley Jacobs, Ashley Jacobs   Account Number:  [id number]  Date Initiated:  05/31/2012  Documentation initiated by:  Ronny Flurry  Subjective/Objective Assessment:   [name] with h/o TB which was treated 18 yrs ago,     Action/Plan:   Anticipated DC Date:     Anticipated DC Plan:  HOME/SELF CARE         Choice offered to / List presented to:             Status of service:  In process, will continue to follow Medicare Important Message given?   (If response is "NO", the following Medicare IM given date fields will be blank) Date Medicare IM given:   Date Additional Medicare IM given:    Discharge Disposition:    Per UR Regulation:  Reviewed for med. necessity/level of care/duration of stay  If discussed at Long Length of Stay Meetings, dates discussed:    Comments:  06-03-12 Meet with patient and son. Son wanting PCP for patient. Son confirmed patient has medicaid . Explained he can call number on medicaid card to see list of providers, also gave him a list of clinics and health connect number. Patient's son stated he will call medicaid and find PCP.  Ronny Flurry RN BSN [phone]

## 2012-06-03 NOTE — Discharge Summary (Signed)
Physician Discharge Summary  Ashley Jacobs ZOX:[id number] DOB: 1956-02-20 DOA: 05/30/2012  PCP: Lehman Prom, NP  Admit date: 05/30/2012 Discharge date: 06/03/2012  Recommendations for Outpatient Follow-up:  1. Pt will need to follow up with PCP in 2-3 weeks post discharge 2. Please obtain BMP to evaluate electrolytes and kidney function 3. Please also check CBC to evaluate Hg and Hct levels  Discharge Diagnoses:  Active Problems:  HYPERTENSION, BENIGN ESSENTIAL  DEEP VENOUS THROMBOPHLEBITIS, LEG, LEFT  Hemoptysis  Leg DVT (deep venous thromboembolism), chronic  Coagulopathy  Chronic DVT left lower extremity  -Patient on Coumadin since 2009  -Will not restart Coumadin at this time -see below regarding discussion with the family about stopping him Hemoptysis  -Hemoglobin remained stable  -Patient denies any further hemoptysis  History of pulmonary tuberculosis  -Isolation discontinued, AFB smears negative  -Currently no signs or symptoms of active TB Hypertension -Stable on lisinopril/hydrochlorothiazide Bronchiectasis -Continue Avelox for 10 days total, 6 more days including today Discharge Condition: stable   Disposition: discharge home  Diet:cardiac  Wt Readings from Last 3 Encounters:  05/30/12 66 kg (145 lb 8.1 oz)  11/18/10 62.823 kg (138 lb 8 oz)  07/19/10 60.782 kg (134 lb)    History of present illness:  56 y/o [name] with h/o TB which was treated 18 yrs ago, came to the hospital with hemoptysis, which has been going on x 3 days. The patient described it as a small amount but it was frequent. Patient says that cough is productive with blood tinged sputum. She also complains of chest pain and shortness of breath. Patient denies fever, but says that she had chills. She denies nausea, vomiting and diarrhea. Also complains of burning in the feet and legs.   Hospital Course:  The patient was admitted to the MedSurg unit, and her Coumadin was  discontinued. Chest x-ray was obtained and revealed stable pleuroparenchymal scarring and bronchiectasis in the right lung apex. Because of her hemoptysis, a CT angiogram of the chest was obtained. It was negative for pulmonary embolus but did reveal right upper lobe bronchiectasis. The pulmonary service was consulted. It was thought that the patient's bronchiectasis in combination with her Coumadin may have been the source of her hemoptysis. Sputum for AFB smears and cultures were obtained. They were negative to date. Airborne isolation was subsequently discontinued. The patient was started on moxifloxacin. She was provided sequential compression devices for DVT prophylaxis. Venous Dopplers of the lower extremities were performed and revealed left leg DVT chronic.there was no clot in the right leg. The patient's hemoptysis chest pain and shortness of breath all resolved  With discontinuation of her Coumadin.a decision was made to place a retrievable inferior vena cava filter. This was placed on 06/02/2012 without any complications. The patient remained afebrile and hemodynamically stable without any further hemoptysis. I discussed with the patient's son, Karren Burly the risks and benefits of continuing versus stopping the Coumadin at the time of discharge. On the day of discharge, the discussion regarding the risks which include but are not limited to life threatening bleed with regard to continuing Coumadin were discussed. In addition the risks including but not limited to recurrent thromboembolism was also discussed regarding stopping the Coumadin. On both these discussions, the family expressed understanding and a mutual decision was made to stop the Coumadin at this time. Further information obtained during these discussions revealed that the patient has not had any previous thromboembolic events prior to or after 2009 when she was diagnosed with her  left lower extremity DVT. She has been on Coumadin since 2009. It  is still unclear whether her original DVT was provoked or unprovoked. The patient's family and patient were strongly encouraged to followup with their primary care provider. I discussed with the patient and family that should she experience any recurrent chest discomfort, shortness of breath, or leg swelling, she needs to see a physician or go to the emergency department. They expressed understanding. immediately.  Consultants: Pulmonary   Discharge Exam: Filed Vitals:   06/03/12 1031  BP: 100/65  Pulse: 87  Temp: 97.9 F (36.6 C)  Resp: 14   Filed Vitals:   06/03/12 0019 06/03/12 0157 06/03/12 0539 06/03/12 1031  BP: 131/85 126/78 89/62 100/65  Pulse: 82 80 75 87  Temp: 97.9 F (36.6 C) 97.8 F (36.6 C) 97.6 F (36.4 C) 97.9 F (36.6 C)  TempSrc: Oral Oral Oral Oral  Resp: 12 14 14 14   Height:      Weight:      SpO2: 95% 96% 95% 96%   General: A&O x 3, NAD, pleasant, cooperative Cardiovascular: RRR, no rub, no gallop, no S3 Respiratory: CTAB, no wheeze, no rhonchi Abdomen:soft, nontender, nondistended, positive bowel sounds Ext: trace edema left leg, right leg-no edema.  No erythema or lymphangitis of either leg. Discharge Instructions      Discharge Orders    Future Orders Please Complete By Expires   Diet - low sodium heart healthy      Increase activity slowly      Discharge instructions      Comments:   Do NOT take warfarin (Coumadin) Take moxifloxacin (Avelox) once daily x 6 more days       Medication List     As of 06/03/2012  1:30 PM    STOP taking these medications         warfarin 5 MG tablet   Commonly known as: COUMADIN      TAKE these medications         lisinopril-hydrochlorothiazide 10-12.5 MG per tablet   Commonly known as: PRINZIDE,ZESTORETIC   Take 1 tablet by mouth daily.      moxifloxacin 400 MG tablet   Commonly known as: AVELOX   Take 1 tablet (400 mg total) by mouth daily at 8 pm.      traMADol 50 MG tablet   Commonly known  as: ULTRAM   Take 50 mg by mouth every 6 (six) hours as needed.          The results of significant diagnostics from this hospitalization (including imaging, microbiology, ancillary and laboratory) are listed below for reference.    Significant Diagnostic Studies: Dg Chest 2 View  05/28/2012  *RADIOLOGY REPORT*  Clinical Data: Hemoptysis.  Remote history of tuberculosis.  CHEST - 2 VIEW  Comparison: CTA chest 11/13/2008.  One-view chest x-ray 03/14/2008, 02/22/2008 and two-view chest x-ray 07/23/2007 [facility].  Findings: Pleuroparenchymal scarring and bronchiectasis involving the right lung apex, unchanged.  Parenchymal scarring in the left upper lobe, unchanged.  Parenchymal scarring at the right lung base, unchanged.  No new pulmonary parenchymal abnormalities. Cardiac silhouette upper normal in size, unchanged.  Thoracic aorta tortuous, unchanged.  Hilar and mediastinal contours otherwise unremarkable.  Tracheal deviation to the right due to the right apical pleuroparenchymal scarring, unchanged.  Visualized bony thorax intact.  IMPRESSION: Stable pleuroparenchymal scarring and bronchiectasis in the right lung apex.  Stable parenchymal scarring in the left upper lobe and at the right lung base.  No  new/acute cardiopulmonary disease.   Original Report Authenticated By: Arnell Sieving, M.D.    Ct Angio Chest Pe W/cm &/or Wo Cm  05/30/2012  *RADIOLOGY REPORT*  Clinical Data: Chest pain with hemoptysis.  History of hypertension.  Question pulmonary embolism.  CT ANGIOGRAPHY CHEST  Technique:  Multidetector CT imaging of the chest using the standard protocol during bolus administration of intravenous contrast. Multiplanar reconstructed images including MIPs were obtained and reviewed to evaluate the vascular anatomy.  Contrast:  60 ml Omnipaque-300 intravenously.  Comparison: Radiographs 05/28/2012.  CT 11/13/2008.  Findings: The pulmonary arteries are well opacified with contrast.  There is no evidence of acute pulmonary embolism.  There is stable mild ectasia of the ascending aorta.  There is stable bilateral superior hilar retraction with chronic right upper lobe volume loss, parenchymal calcification and bronchiectasis consistent with history of tuberculosis.  No superimposed air-fluid levels are identified.  There is stable right lung nodularity, measuring 6 mm in the lower lobe (image 24), 7 mm in the middle lobe (image 29) and 5 mm in the lower lobe (image 32).  Lesser parenchymal scarring and nodularity in the left lung also appears stable.  No endobronchial lesion is seen.  Low-density left thyroid nodule has decreased in size, measuring 2.6 cm on image #7.  There is improved tracheal deviation to the right.  Mild nodal prominence in the lower right paratracheal region is stable.  There is no progressive adenopathy.  There is no pleural or pericardial effusion.  Images through the upper abdomen demonstrate diffuse hepatic steatosis and mildly prominent lymph nodes within the porta hepatis, not previously imaged.  IMPRESSION:  1.  No evidence of acute pulmonary embolism. 2.  Grossly stable chronic changes consistent with old tuberculosis infection.  There is scattered nodularity with chronic volume loss and bronchiectasis in the right upper lobe. 3.  No definite acute superimposed inflammatory or neoplastic process identified. 4.  Hepatic steatosis.   Original Report Authenticated By: Gerrianne Scale, M.D.    Ir Ivc Filter Plmt / S&i /img Guid/mod Sed  06/03/2012  *RADIOLOGY REPORT*  Clinical Data/Indication: PULMONARY THROMBOEMBOLISM.  ON COUMADIN ANTICOAGULATION THERAPY AND HAVING HEMOPTYSIS.  IR IVC FILTER PLACEMENT/ S+I/ IMAGE GUIDE MODERATE SEDATION,IR ULTRASOUND GUIDANCE VASC ACCESS RIGHT  Sedation: Versed 1.0 mg, Fentanyl 50 mg.  Total Moderate Sedation Time: 27 minutes.  Contrast Volume: 60 ml Omnipaque-300.  Fluoroscopy Time: 7.0 minutes.  Procedure: The procedure, risks,  benefits, and alternatives were explained to the patient. Questions regarding the procedure were encouraged and answered. The patient understands and consents to the procedure. A [place] interpreter was utilized directly by me.  The right neck was prepped with betadine in a sterile fashion, and a sterile drape was applied covering the operative field. A sterile gown and sterile gloves were used for the procedure.  Under sonographic guidance, a micropuncture needle was inserted into the right internal jugular vein and removed over a 0018 wire which was upsized to a [employer].  Sonographic and fluoroscopic documentation was obtained.  The Celect filter sheath was then advanced over the wire to the IVC.  IVC venography was performed.  No DVT in the IVC.  Renal inflate was delineated.  Left common iliac vein failed to opacify on initial venography. The subsequent procedure was performed to exclude a duplicated IVC.  A vertebral catheter was then advanced through the sheath into the IVC.  It is was advanced carefully into the region of the left common and external iliac veins.  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  The left vertebral catheter was then advanced into the left renal vein and left renal venography was performed.  The Celect filter was then deployed in the infrarenal IVC with its tip at the L1-2 disc.  Findings: IVC venography demonstrates renal vein inflow at L1-2. No DVT.  The left common iliac vein failed to opacify. 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  Left renal vein venography demonstrates a normal left renal vein without evidence of duplicated IVC.  Complications: None.  IMPRESSION:  Successful temporary Celect IVC infrarenal filter placement as described.  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   Original Report Authenticated By: Donavan Burnet, M.D.     Ir US Guide Vasc Access Right  06/03/2012  *RADIOLOGY REPORT*  Clinical Data/Indication: PULMONARY THROMBOEMBOLISM.  ON COUMADIN ANTICOAGULATION THERAPY AND HAVING HEMOPTYSIS.  IR IVC FILTER PLACEMENT/ S+I/ IMAGE GUIDE MODERATE SEDATION,IR ULTRASOUND GUIDANCE VASC ACCESS RIGHT  Sedation: Versed 1.0 mg, Fentanyl 50 mg.  Total Moderate Sedation Time: 27 minutes.  Contrast Volume: 60 ml Omnipaque-300.  Fluoroscopy Time: 7.0 minutes.  Procedure: The procedure, risks, benefits, and alternatives were explained to the patient. Questions regarding the procedure were encouraged and answered. The patient understands and consents to the procedure. A [place] interpreter was utilized directly by me.  The right neck was prepped with betadine in a sterile fashion, and a sterile drape was applied covering the operative field. A sterile gown and sterile gloves were used for the procedure.  Under sonographic guidance, a micropuncture needle was inserted into the right internal jugular vein and removed over a 0018 wire which was upsized to a [employer].  Sonographic and fluoroscopic documentation was obtained.  The Celect filter sheath was then advanced over the wire to the IVC.  IVC venography was performed.  No DVT in the IVC.  Renal inflate was delineated.  Left common iliac vein failed to opacify on initial venography. The subsequent procedure was performed to exclude a duplicated IVC.  A vertebral catheter was then advanced through the sheath into the IVC.  It is was advanced carefully into the region of the left common and external iliac veins.  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  The left vertebral catheter was then advanced into the left renal vein and left renal venography was performed.  The Celect filter was then deployed in the infrarenal IVC with its tip at the L1-2 disc.  Findings: IVC venography demonstrates renal vein inflow at L1-2. No DVT.  The left common iliac vein  failed to opacify. 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  Left renal vein venography demonstrates a normal left renal vein without evidence of duplicated IVC.  Complications: None.  IMPRESSION:  Successful temporary Celect IVC infrarenal filter placement as described.  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   Original Report Authenticated By: Donavan Burnet, M.D.      Microbiology: Recent Results (from the past 240 hour(s))  AFB CULTURE WITH SMEAR     Status: Normal (Preliminary result)   Collection Time   05/31/12 11:00 AM      Component Value Range Status Comment   Specimen Description SPUTUM   Final    Special Requests Normal   Final    ACID FAST SMEAR NO ACID  FAST BACILLI SEEN   Final    Culture     Final    Value: CULTURE WILL BE EXAMINED FOR 6 WEEKS BEFORE ISSUING A FINAL REPORT   Report Status PENDING   Incomplete   AFB CULTURE WITH SMEAR     Status: Normal (Preliminary result)   Collection Time   06/01/12  5:29 AM      Component Value Range Status Comment   Specimen Description SPUTUM   Final    Special Requests NONE   Final    ACID FAST SMEAR NO ACID FAST BACILLI SEEN   Final    Culture     Final    Value: CULTURE WILL BE EXAMINED FOR 6 WEEKS BEFORE ISSUING A FINAL REPORT   Report Status PENDING   Incomplete   CULTURE, EXPECTORATED SPUTUM-ASSESSMENT     Status: Normal   Collection Time   06/01/12  5:29 AM      Component Value Range Status Comment   Specimen Description SPUTUM   Final    Special Requests NONE   Final    Sputum evaluation     Final    Value: MICROSCOPIC FINDINGS SUGGEST THAT THIS SPECIMEN IS NOT REPRESENTATIVE OF LOWER RESPIRATORY SECRETIONS. PLEASE RECOLLECT.     CALLED TO CAGUIOA,N RN 1191 06/01/12 MITCHELL,L   Report Status 06/01/2012 FINAL   Final   CULTURE, EXPECTORATED SPUTUM-ASSESSMENT     Status: Normal   Collection Time   06/01/12 11:35 AM       Component Value Range Status Comment   Specimen Description SPUTUM   Final    Special Requests NONE   Final    Sputum evaluation     Final    Value: MICROSCOPIC FINDINGS SUGGEST THAT THIS SPECIMEN IS NOT REPRESENTATIVE OF LOWER RESPIRATORY SECRETIONS. PLEASE RECOLLECT.     CALLED TO Malen Gauze, RN 06/01/12 1155 BY K SCHULTZ   Report Status 06/01/2012 FINAL   Final   CULTURE, EXPECTORATED SPUTUM-ASSESSMENT     Status: Normal   Collection Time   06/03/12  7:40 AM      Component Value Range Status Comment   Specimen Description SPUTUM   Final    Special Requests NONE   Final    Sputum evaluation     Final    Value: MICROSCOPIC FINDINGS SUGGEST THAT THIS SPECIMEN IS NOT REPRESENTATIVE OF LOWER RESPIRATORY SECRETIONS. PLEASE RECOLLECT.     CALLED TO C STOLWYCK, RN 06/03/12 0854 BY K SCHULTZ   Report Status 06/03/2012 FINAL   Final      Labs: Basic Metabolic Panel:  Lab 06/03/12 4782 06/02/12 0530 05/31/12 0637 05/30/12 0957  NA 135 138 139 140  K 3.9 3.8 -- --  CL 100 105 105 105  CO2 31 28 26 27   GLUCOSE 81 127* 116* 94  BUN 12 17 14 11   CREATININE 0.82 0.84 0.74 0.64  CALCIUM 9.5 8.8 9.3 9.5  MG -- -- -- --  PHOS -- -- -- --   Liver Function Tests:  Lab 05/31/12 0637  AST 29  ALT 36*  ALKPHOS 87  BILITOT 0.3  PROT 7.2  ALBUMIN 3.1*   No results found for this basename: LIPASE:5,AMYLASE:5 in the last 168 hours No results found for this basename: AMMONIA:5 in the last 168 hours CBC:  Lab 06/03/12 0540 06/02/12 0530 05/30/12 0957  WBC 5.7 5.7 5.6  NEUTROABS -- -- 3.4  HGB 11.8* 11.4* 12.5  HCT 35.0* 34.0* 37.3  MCV 88.6 90.4 88.4  PLT 187 192 206   Cardiac Enzymes: No results found for this basename: CKTOTAL:5,CKMB:5,CKMBINDEX:5,TROPONINI:5 in the last 168 hours BNP: No components found with this basename: POCBNP:5 CBG: No results found for this basename: GLUCAP:5 in the last 168 hours  Time coordinating discharge:  Greater than 30 minutes  Signed:  Carmelita Jacobs,  Ashley Rodden, DO [facility] Pager: [phone] 06/03/2012, 1:30 PM

## 2012-06-03 NOTE — Progress Notes (Signed)
I had a long discussion with the patient's son yesterday. I discussed the risks, benefits of continuing versus discontinuing warfarin. After discussion with the son, we mutually agreed to not restart the warfarin after the IVC filter at the time of discharge especially in light of the ultrasound result showing chronic DVT, but not acute DVT in the left leg. After discussion, it was unclear whether her original DVT was provoked or unprovoked. She has been on Coumadin since 2009 according to the son.  I will wait for clearance from the pulmonary service for possible discharge home today, September 19. Continue moxifloxacin.

## 2012-06-14 ENCOUNTER — Telehealth: Payer: Self-pay | Admitting: Critical Care Medicine

## 2012-06-14 NOTE — Telephone Encounter (Signed)
I received a call from [facility] with a call report on AFB culture. Spoke with Karren Burly and she states AFB was isolated. According to hospital discharge notes the pt was not advised to f/u here and she did not have a PCP at time of discharge. Last critical care note is from Dr. Delford Field. Dr. Delford Field please advise. Carron Curie, CMA

## 2012-06-15 NOTE — Telephone Encounter (Signed)
She probably has active TB We need to get her an appt here so we can direct her to health dept  She speaks no english, need a [place] interpreter for the OV, any provider can see her

## 2012-06-15 NOTE — Telephone Encounter (Signed)
I spoke with the pt son Freddrick March and he states he is currently at work and asked to call us back tomorrow. I provided the number and will await call back.Carron Curie, CMA

## 2012-06-19 NOTE — Progress Notes (Signed)
I was notified of patient's recent AFB positive sputum culture via EPIC that was obtained during her 05/30/12 admission.  The Culture was negative at the time of discharge. Dr. Lynelle Doctor office was notified of the results on 06/14/12.   Patient's son Ashley Jacobs was contacted by Dr. Luisa Hart Wright's office on 06/15/12 and told that the patient needs to be seen in the office ASAP.  The family has not called the office back to schedule an appointment.  I called the patient's son Ashley Jacobs today.  I explained the results of the test to him and the possibility of patient having pulmonary TB given her  previous hx of TB when she was in [place].  [place]  re-affirmed that they have not made any physician followups since the call from Dr. Lynelle Doctor office.  I instructed him to take the patient to go the emergency department ASAP due to her condition and public health risk.  Patient lives with sons.  Ashley Jacobs expressed understanding of the urgency and my explanation of medical risks and agreed to take patient to the ED ASAP.  Ashley Jacobs states patient has not had any further hemoptysis.  He states patient is actually doing well without SOB, fever, chills, cough.

## 2012-06-20 ENCOUNTER — Emergency Department (HOSPITAL_COMMUNITY)
Admission: EM | Admit: 2012-06-20 | Discharge: 2012-06-20 | Disposition: A | Discharge status: Home / Self Care | Payer: Medicaid Other | Source: Home / Self Care | Attending: Emergency Medicine | Admitting: Emergency Medicine

## 2012-06-20 ENCOUNTER — Encounter (HOSPITAL_COMMUNITY): Payer: Self-pay | Admitting: *Deleted

## 2012-06-20 DIAGNOSIS — A150 Tuberculosis of lung: Secondary | ICD-10-CM

## 2012-06-20 DIAGNOSIS — A159 Respiratory tuberculosis unspecified: Secondary | ICD-10-CM

## 2012-06-20 NOTE — ED Provider Notes (Addendum)
Chief Complaint  Patient presents with  . PPD Reading    History of Present Illness:   The patient is a 56 year old [place] female who presents today because of a positive TB sputum culture. She is accompanied by her son who acts as her interpreter. She was hospitalized on September 15 because of hemoptysis. She had a prior history of tuberculosis in the past. In working up her hemoptysis, it was thought that this was due to bronchiectasis and anticoagulation. Induced sputum were obtained as well as bronchoscopic lavage sputum. Initial smears for TB were negative. It was not felt that she had TB. Subsequent cultures of the bronchoscopic lavage, however turned out positive for tuberculosis. The results were sent to Dr. Luisa Hart Wright's office. Someone from Dr. Lynelle Doctor office called her and told her to go to the emergency room because of the positivity of the cultures. She has been at home and not practicing any preventive measures. She has had no symptoms including no further hemoptysis, coughing, sputum production, fever, chills, sweats, weight loss, or chest pain.  Review of Systems:  Other than noted above, the patient denies any of the following symptoms. Systemic:  No fever, chills, sweats, fatigue, myalgias, headache, or anorexia. Eye:  No redness, pain or drainage. ENT:  No earache, nasal congestion, rhinorrhea, sinus pressure, or sore throat. Lungs:  No cough, sputum production, wheezing, shortness of breath.  Cardiovascular:  No chest pain, palpitations, or syncope. GI:  No nausea, vomiting, abdominal pain or diarrhea. GU:  No dysuria, frequency, or hematuria. Skin:  No rash or pruritis.  PMFSH:  Past medical history, family history, social history, meds, and allergies were reviewed.   Physical Exam:   Vital signs:  BP 120/87  Pulse 96  Temp 98.5 F (36.9 C) (Oral)  Resp 18  SpO2 99% General:  Alert, in no distress. Eye:  PERRL, full EOMs.  Lids and conjunctivas were normal. ENT:   TMs and canals were normal, without erythema or inflammation.  Nasal mucosa was clear and uncongested, without drainage.  Mucous membranes were moist.  Pharynx was clear, without exudate or drainage.  There were no oral ulcerations or lesions. Neck:  Supple, no adenopathy, tenderness or mass. Thyroid was normal. Lungs:  No respiratory distress.  Lungs were clear to auscultation, without wheezes, rales or rhonchi.  Breath sounds were clear and equal bilaterally. Heart:  Regular rhythm, without gallops, murmers or rubs. Abdomen:  Soft, flat, and non-tender to palpation.  No hepatosplenomagaly or mass. Skin:  Clear, warm, and dry, without rash or lesions.  Course in Urgent Care Center:   Consulted over the telephone with Dr. Carolyne Fiscal and Dr. Luciana Axe from infectious disease. They both felt that she did not need to go to the emergency room tonight and that she should be referred to the health department for treatment there. I conveyed this to her son, Webb Lions (telephone [phone]). Dr. Luciana Axe and stated that he would report this to the health department and take care of making sure that she got there for her treatment.  Assessment:  The encounter diagnosis was Tuberculosis.  Plan:   1.  The following meds were prescribed:   New Prescriptions   No medications on file   2.  She has some were told to follow up with the health department who will be calling her tomorrow. She was also told through an interpreter to always wear a mask, to avoid the public, and to have all of her family members tested for  TB. Her son who serves as her interpreter voiced his understanding of this plan and will make sure it is carried out. 3.  The patient was told to return if becoming worse in any way, if no better in 3 or 4 days, and given some red flag symptoms that would indicate earlier return.    Reuben Likes, MD 06/20/12 2020  Reuben Likes, MD 06/20/12 4098  Reuben Likes, MD 06/21/12 [phone]

## 2012-06-20 NOTE — ED Notes (Signed)
Pt sent for further eval of positive tb screening

## 2012-07-13 LAB — AFB CULTURE WITH SMEAR (NOT AT ARMC)
Acid Fast Smear: NONE SEEN
Acid Fast Smear: NONE SEEN

## 2012-07-21 ENCOUNTER — Telehealth: Payer: Self-pay | Admitting: Critical Care Medicine

## 2012-07-21 NOTE — Telephone Encounter (Signed)
Called # listed had to lmtcb x1

## 2012-07-22 ENCOUNTER — Encounter: Payer: Self-pay | Admitting: Critical Care Medicine

## 2012-07-22 ENCOUNTER — Ambulatory Visit (INDEPENDENT_AMBULATORY_CARE_PROVIDER_SITE_OTHER): Payer: Medicaid Other | Admitting: Critical Care Medicine

## 2012-07-22 VITALS — BP 122/72 | HR 96 | Temp 97.9°F | Ht 61.0 in | Wt 141.0 lb

## 2012-07-22 DIAGNOSIS — J479 Bronchiectasis, uncomplicated: Secondary | ICD-10-CM

## 2012-07-22 DIAGNOSIS — I82409 Acute embolism and thrombosis of unspecified deep veins of unspecified lower extremity: Secondary | ICD-10-CM

## 2012-07-22 LAB — AFB CULTURE WITH SMEAR (NOT AT ARMC)

## 2012-07-22 NOTE — Progress Notes (Signed)
Subjective:    Patient ID: Ashley Jacobs, female    DOB: 1956-01-04, 56 y.o.   MRN: [id number]  HPI 56 y.o. [place] F  Hx of MTB and cavitary lung dz and bronchiectasis with hemoptysis In hospital 05/2012:  Cultures positive M. [place] Not MTB Not able to afford meds. Pt doing well now. No hemoptysis.  Pt occ has chest pain, and sl chest tightness and dyspnea. Occ qhs dyspnea.  No fever/chills/sweats.  Pt note head is heavy.   No leg issues. Hx of DVT LLE Pt had IVC filter placed 05/2012    Past Medical History  Diagnosis Date  . Hypertension   . Chronic pain     right hip  . Tuberculosis ~ 1980    "took medicine for it 6 months"  . Leg DVT (deep venous thromboembolism), chronic 2009; 06/02/2012    "always left"  . Anginal pain 05/30/2012  . Shortness of breath 05/30/2012     No family history on file.   History   Social History  . Marital Status: Married    Spouse Name: N/A    Number of Children: N/A  . Years of Education: N/A   Occupational History  . Not on file.   Social History Main Topics  . Smoking status: Never Smoker   . Smokeless tobacco: Never Used  . Alcohol Use: No  . Drug Use: No  . Sexually Active: No   Other Topics Concern  . Not on file   Social History Narrative  . No narrative on file     No Known Allergies   Outpatient Prescriptions Prior to Visit  Medication Sig Dispense Refill  . lisinopril-hydrochlorothiazide (PRINZIDE,ZESTORETIC) 10-12.5 MG per tablet Take 1 tablet by mouth daily.      Marland Kitchen moxifloxacin (AVELOX) 400 MG tablet Take 1 tablet (400 mg total) by mouth daily at 8 pm.  6 tablet  0  . traMADol (ULTRAM) 50 MG tablet Take 50 mg by mouth every 6 (six) hours as needed.       Last reviewed on 07/22/2012  2:43 PM by Raford Pitcher, RN   Review of Systems Constitutional:   No  weight loss, night sweats,  Fevers, chills, fatigue, lassitude. HEENT:   No headaches,  Difficulty swallowing,  Tooth/dental problems,  Sore throat,                  No sneezing, itching, ear ache, nasal congestion, post nasal drip,   CV:  No chest pain,  Orthopnea, PND, swelling in lower extremities, anasarca, dizziness, palpitations  GI  No heartburn, indigestion, abdominal pain, nausea, vomiting, diarrhea, change in bowel habits, loss of appetite  Resp: No shortness of breath with exertion or at rest.  No excess mucus, no productive cough,  No non-productive cough,  No coughing up of blood.  No change in color of mucus.  No wheezing.  No chest wall deformity  Skin: no rash or lesions.  GU: no dysuria, change in color of urine, no urgency or frequency.  No flank pain.  MS:  No joint pain or swelling.  No decreased range of motion.  No back pain.  Psych:  No change in mood or affect. No depression or anxiety.  No memory loss.     Objective:   Physical Exam Filed Vitals:   07/22/12 1443  BP: 122/72  Pulse: 96  Temp: 97.9 F (36.6 C)  TempSrc: Oral  Height: 5\' 1"  (1.549 m)  Weight: 141 lb (63.957  kg)  SpO2: 98%    Gen: Pleasant, well-nourished, in no distress,  normal affect  ENT: No lesions,  mouth clear,  oropharynx clear, no postnasal drip  Neck: No JVD, no TMG, no carotid bruits  Lungs: No use of accessory muscles, no dullness to percussion, clear without rales or rhonchi  Cardiovascular: RRR, heart sounds normal, no murmur or gallops, no peripheral edema  Abdomen: soft and NT, no HSM,  BS normal  Musculoskeletal: No deformities, no cyanosis or clubbing  Neuro: alert, non focal  Skin: Warm, no lesions or rashes  No results found.        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 She is not a public health threat Plan No further abx Return 6 months   Updated Medication List Outpatient Encounter Prescriptions as of 07/22/2012  Medication Sig Dispense Refill  .  [DISCONTINUED] lisinopril-hydrochlorothiazide (PRINZIDE,ZESTORETIC) 10-12.5 MG per tablet Take 1 tablet by mouth daily.      . [DISCONTINUED] moxifloxacin (AVELOX) 400 MG tablet Take 1 tablet (400 mg total) by mouth daily at 8 pm.  6 tablet  0  . [DISCONTINUED] traMADol (ULTRAM) 50 MG tablet Take 50 mg by mouth every 6 (six) hours as needed.

## 2012-07-22 NOTE — Assessment & Plan Note (Signed)
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 She is not a public health threat Plan No further abx Return 6 months

## 2012-07-22 NOTE — Telephone Encounter (Signed)
A woman from [employer]  (whose name I could not understand) called back re: pt. (Lines were busy in triage). She asked for Dr. Mauro Kaufmann- I gave her the only # I could find for him but I will go into the triage room now to see if a nurse can call Malachi Bonds back again at # listed above. Hazel Sams

## 2012-07-22 NOTE — Patient Instructions (Signed)
Stop blood pressure medications No more antibiotics needed Call if your breathing worsens Return 6 months

## 2012-07-22 NOTE — Telephone Encounter (Signed)
Ashley Jacobs is going to callback to let us know which appt he can bring the pt to, PW today at 3pm in [place] or with [facility] tomorrow at 3pm in [place]. [name] returned call and states his wife will bring the pt to the [place] office to see Dr. Delford Field at 3 pm and knows to arrive by 2:45. I will forward culture results to the [place] office so PW has them.

## 2012-07-22 NOTE — Telephone Encounter (Signed)
I spoke with Malachi Bonds at [facility] and she will be faxing over results of sputum culture. Positive for Mycobacterium Chelonae/M. Abscessus Group.  In a previous phone note from 06/14/12 Dr. Delford Field had wanted the pt scheduled to be seen in the office but this never took place. According to UC note dated 06/20/12, pt was seen by Dr. Leslee Home and states in his note that the pt was going to follow-up with the health dept for any treatment and that the health dept would be calling the pt the next day to set this up. UC note also states that a Dr. Luciana Axe would be following up on this to make sure the appt was taken care of.  I did call the [facility] and spoke with Tammy in the [facility], [phone]. They do not show that this pt has been seen there since 2008/2009. I was asked to contact the lead TB nurse, Theodoro Clock at [phone] if pt needs to be seen there.  Dr. Delford Field, report has been placed in your look at. Pls advise if we need to do anything further at this point. Is OV still needed here at our office or [facility]?

## 2012-07-22 NOTE — Telephone Encounter (Signed)
This is not Mycobacterium tuberculosis, this is an atypical mycobacterium.  She is not a public health threat, there fore I doubt the health dept will track/treat her.  She needs an OV asap to discuss.

## 2012-07-23 ENCOUNTER — Institutional Professional Consult (permissible substitution): Payer: Medicaid Other | Admitting: Pulmonary Disease

## 2013-06-07 ENCOUNTER — Encounter (HOSPITAL_COMMUNITY): Payer: Self-pay

## 2013-06-07 ENCOUNTER — Emergency Department (HOSPITAL_COMMUNITY)
Admission: EM | Admit: 2013-06-07 | Discharge: 2013-06-08 | Disposition: A | Discharge status: Home / Self Care | Payer: Medicaid Other | Attending: Emergency Medicine | Admitting: Emergency Medicine

## 2013-06-07 ENCOUNTER — Encounter (HOSPITAL_COMMUNITY): Payer: Self-pay | Admitting: Emergency Medicine

## 2013-06-07 ENCOUNTER — Emergency Department (HOSPITAL_COMMUNITY)
Admission: EM | Admit: 2013-06-07 | Discharge: 2013-06-07 | Disposition: A | Discharge status: Nursing Home (Includes ICF) | Payer: Medicaid Other | Source: Home / Self Care | Attending: Emergency Medicine | Admitting: Emergency Medicine

## 2013-06-07 DIAGNOSIS — R109 Unspecified abdominal pain: Secondary | ICD-10-CM | POA: Insufficient documentation

## 2013-06-07 DIAGNOSIS — Z8611 Personal history of tuberculosis: Secondary | ICD-10-CM | POA: Insufficient documentation

## 2013-06-07 DIAGNOSIS — M791 Myalgia, unspecified site: Secondary | ICD-10-CM

## 2013-06-07 DIAGNOSIS — G8929 Other chronic pain: Secondary | ICD-10-CM | POA: Insufficient documentation

## 2013-06-07 DIAGNOSIS — Z86718 Personal history of other venous thrombosis and embolism: Secondary | ICD-10-CM | POA: Insufficient documentation

## 2013-06-07 DIAGNOSIS — M79605 Pain in left leg: Secondary | ICD-10-CM

## 2013-06-07 DIAGNOSIS — R51 Headache: Secondary | ICD-10-CM | POA: Insufficient documentation

## 2013-06-07 DIAGNOSIS — R05 Cough: Secondary | ICD-10-CM | POA: Insufficient documentation

## 2013-06-07 DIAGNOSIS — M545 Low back pain, unspecified: Secondary | ICD-10-CM | POA: Insufficient documentation

## 2013-06-07 DIAGNOSIS — I10 Essential (primary) hypertension: Secondary | ICD-10-CM | POA: Insufficient documentation

## 2013-06-07 DIAGNOSIS — IMO0001 Reserved for inherently not codable concepts without codable children: Secondary | ICD-10-CM | POA: Insufficient documentation

## 2013-06-07 DIAGNOSIS — M79609 Pain in unspecified limb: Secondary | ICD-10-CM

## 2013-06-07 DIAGNOSIS — R059 Cough, unspecified: Secondary | ICD-10-CM | POA: Insufficient documentation

## 2013-06-07 NOTE — ED Provider Notes (Signed)
CSN: [id number]     Arrival date & time 06/07/13  1846 History   First MD Initiated Contact with Patient 06/07/13 1940     Chief Complaint  Patient presents with  . Joint Pain  . Abdominal Pain   (Consider location/radiation/quality/duration/timing/severity/associated sxs/prior Treatment) Patient is a 57 y.o. female presenting with leg pain. The history is provided by the patient and a relative. No language interpreter was used.  Leg Pain Location:  Leg and knee Leg location:  L leg Pain details:    Quality:  Aching and throbbing   Radiates to:  L leg   Severity:  Moderate   Onset quality:  Gradual   Duration:  3 days   Timing:  Constant   Progression:  Worsening Chronicity:  New Prior injury to area:  No Worsened by:  Activity Ineffective treatments:  None tried Pt reports she has had a dvt in left leg.   Daughter reports leg is starting to swell again and is painful.  Pt reports some recent increase in shortness of breath, mostly at night.   Pt has a history of TB.  (took medication, not currently on any medications)   Pt was on coumadin.  ([facility])    Pt also complain so soreness in back and epigastric area.  Past Medical History  Diagnosis Date  . Hypertension   . Chronic pain     right hip  . Tuberculosis ~ 1980    "took medicine for it 6 months"  . Leg DVT (deep venous thromboembolism), chronic 2009; 06/02/2012    "always left"  . Anginal pain 05/30/2012  . Shortness of breath 05/30/2012   Past Surgical History  Procedure Laterality Date  . Vena cava filter placement  06/02/12   No family history on file. History  Substance Use Topics  . Smoking status: Never Smoker   . Smokeless tobacco: Never Used  . Alcohol Use: No   OB History   Grav Para Term Preterm Abortions TAB SAB Ect Mult Living                 Review of Systems  Respiratory: Positive for shortness of breath.   Cardiovascular: Positive for leg swelling.   Gastrointestinal: Positive for abdominal pain.  All other systems reviewed and are negative.    Allergies  Review of patient's allergies indicates no known allergies.  Home Medications  No current outpatient prescriptions on file. BP 172/120  Pulse 90  Temp(Src) 97.9 F (36.6 C) (Oral)  Resp 18  SpO2 98% Physical Exam  Nursing note and vitals reviewed. Constitutional: She appears well-developed and well-nourished.  Eyes: Pupils are equal, round, and reactive to light.  Neck: Normal range of motion.  Cardiovascular: Normal rate.   Pulmonary/Chest: Effort normal.  Abdominal: Soft.  Musculoskeletal: She exhibits edema and tenderness.  Left leg larger than right   Pain with homan's.     Neurological: She is alert.  Skin: Skin is warm.    ED Course  Procedures (including critical care time) Labs Review Labs Reviewed - No data to display Imaging Review No results found.  MDM   1. Leg pain, left    Pt to ED for ct angio chest.   Pt probably has pe,  Triad of leg swelling with clot history, back and chest pain concern me for PE.  Chest pain does not sound cardiac.      Lonia Skinner [place], PA-C 06/07/13 2020

## 2013-06-07 NOTE — ED Notes (Signed)
Pt c/o Right leg swelling and pain, headache, and lower back pain x3 days. Denies any incontinence. Pt seen at UC at sent here to r/o a PE

## 2013-06-07 NOTE — ED Provider Notes (Signed)
Medical screening examination/treatment/procedure(s) were performed by non-physician practitioner and as supervising physician I was immediately available for consultation/collaboration.  Leslee Home, M.D.  Reuben Likes, MD 06/07/13 2059

## 2013-06-07 NOTE — ED Notes (Signed)
Via daughter Rubbie Battiest)... Pt c/o epigastric pain onset 3 days and general joint pain  Denies: f/v/d.... No PCP Alert w/no signs of acute distress.

## 2013-06-08 ENCOUNTER — Encounter (HOSPITAL_COMMUNITY): Payer: Self-pay | Admitting: Radiology

## 2013-06-08 ENCOUNTER — Emergency Department (HOSPITAL_COMMUNITY): Payer: Medicaid Other

## 2013-06-08 LAB — CBC WITH DIFFERENTIAL/PLATELET
Basophils Absolute: 0 10*3/uL (ref 0.0–0.1)
Basophils Relative: 0 % (ref 0–1)
MCHC: 34 g/dL (ref 30.0–36.0)
Neutro Abs: 2.8 10*3/uL (ref 1.7–7.7)
Neutrophils Relative %: 50 % (ref 43–77)
RDW: 13.6 % (ref 11.5–15.5)

## 2013-06-08 LAB — URINALYSIS, ROUTINE W REFLEX MICROSCOPIC
Hgb urine dipstick: NEGATIVE
Ketones, ur: NEGATIVE mg/dL
Protein, ur: NEGATIVE mg/dL
Urobilinogen, UA: 0.2 mg/dL (ref 0.0–1.0)

## 2013-06-08 LAB — PROTIME-INR: Prothrombin Time: 13.9 seconds (ref 11.6–15.2)

## 2013-06-08 LAB — BASIC METABOLIC PANEL
Chloride: 107 mEq/L (ref 96–112)
Creatinine, Ser: 0.6 mg/dL (ref 0.50–1.10)
GFR calc Af Amer: >90 mL/min (ref >90)
GFR calc non Af Amer: >90 mL/min (ref >90)
Potassium: 3.6 mEq/L (ref 3.5–5.1)

## 2013-06-08 MED ORDER — IBUPROFEN 800 MG PO TABS
800.0000 mg | ORAL_TABLET | Freq: Once | ORAL | Status: AC
  Administered 2013-06-08: 800 mg via ORAL
  Filled 2013-06-08: qty 4

## 2013-06-08 MED ORDER — IOHEXOL 350 MG/ML SOLN
100.0000 mL | Freq: Once | INTRAVENOUS | Status: AC | PRN
  Administered 2013-06-08: 100 mL via INTRAVENOUS

## 2013-06-08 MED ORDER — LISINOPRIL-HYDROCHLOROTHIAZIDE 10-12.5 MG PO TABS: 1.0000 | ORAL_TABLET | Freq: Every day | ORAL | Status: DC

## 2013-06-08 MED ORDER — IBUPROFEN 800 MG PO TABS: 800.0000 mg | ORAL_TABLET | Freq: Three times a day (TID) | ORAL | Status: DC | PRN

## 2013-06-08 NOTE — ED Notes (Signed)
Patient does not speak or understand [place]. Pt daughter here translating for patient. Patient per daughter states- Leg pain that started in 2010, has been seen for it before. Pt also complaining of whole body aches, hurts when she breathes. Back pain radiating to lower back. Lung sounds to R upper lobe diminished.

## 2013-06-08 NOTE — ED Notes (Signed)
Per pt through daughter- Have been having the back and leg pain since 2010.

## 2013-06-08 NOTE — ED Provider Notes (Signed)
CSN: [id number]     Arrival date & time 06/07/13  2039 History   First MD Initiated Contact with Patient 06/08/13 0105     Chief Complaint  Patient presents with  . Back Pain  . Headache   HPI  History provided by the patient and daughter. Patient is a 57 year old female with history of hypertension, previous TB that was treated, chronic left lower extremity DVT who presents with complaints of headache and general body pains. Symptoms first began 3 days ago with a general diffuse headache described as mild to moderate with pressure and throbbing. She then developed aching pains through her back and body including abdomen and lower legs bilaterally. She has complained of worse pain to the left side. She denies any increased swelling of the lower extremities. She has had occasional coughing that is nonproductive. Denies any hemoptysis. There has been no recent travel or recent immobility. No prior history of PE. Gums are not associated with any fever, chills or sweats. No nasal congestion or rhinorrhea. No nausea, vomiting or diarrhea. Patient has not used any medications for symptoms. No other aggravating or alleviating factors. No other associated symptoms. The patient was initially seen for these symptoms at urgent care and was sent for further evaluation with concerns for a past history of DVT in the leg. She was previously on Coumadin but has not been on any of her regular medications including her medications for hypertension.       Past Medical History  Diagnosis Date  . Hypertension   . Chronic pain     right hip  . Tuberculosis ~ 1980    "took medicine for it 6 months"  . Leg DVT (deep venous thromboembolism), chronic 2009; 06/02/2012    "always left"  . Anginal pain 05/30/2012  . Shortness of breath 05/30/2012   Past Surgical History  Procedure Laterality Date  . Vena cava filter placement  06/02/12   History reviewed. No pertinent family history. History  Substance Use Topics   . Smoking status: Never Smoker   . Smokeless tobacco: Never Used  . Alcohol Use: No   OB History   Grav Para Term Preterm Abortions TAB SAB Ect Mult Living                 Review of Systems  Constitutional: Negative for fever, chills and diaphoresis.  HENT: Negative for congestion and rhinorrhea.   Respiratory: Positive for cough. Negative for shortness of breath.   Cardiovascular: Negative for chest pain.  Gastrointestinal: Positive for abdominal pain. Negative for nausea, vomiting, diarrhea and constipation.  Genitourinary: Negative for dysuria, frequency and hematuria.  Musculoskeletal: Positive for myalgias and back pain.  Neurological: Positive for headaches.  All other systems reviewed and are negative.    Allergies  Review of patient's allergies indicates no known allergies.  Home Medications   Current Outpatient Rx  Name  Route  Sig  Dispense  Refill  . Acetaminophen (TYLENOL PO)   Oral   Take 2 tablets by mouth as needed (pain).          BP 167/90  Pulse 80  Temp(Src) 98.4 F (36.9 C) (Oral)  Resp 19  SpO2 98% Physical Exam  Nursing note and vitals reviewed. Constitutional: She is oriented to person, place, and time. She appears well-developed and well-nourished. No distress.  HENT:  Head: Normocephalic and atraumatic.  Eyes: Conjunctivae and EOM are normal. Pupils are equal, round, and reactive to light.  Neck: Normal range of motion.  Neck supple.  No meningeal signs  Cardiovascular: Normal rate and regular rhythm.   No murmur heard. Pulmonary/Chest: Effort normal and breath sounds normal. No respiratory distress. She has no wheezes. She has no rales.  Abdominal: Soft. There is no tenderness. There is no rigidity, no rebound, no guarding, no CVA tenderness and no tenderness at McBurney's point.  Musculoskeletal:  Left lower leg appears slightly larger. There is not significantly large amounts of swelling.  Patient does express mild tenderness to  palpation of bilateral lower legs. Skin is normal without erythema or increased warmth.  Neurological: She is alert and oriented to person, place, and time. She has normal strength. No cranial nerve deficit or sensory deficit.  Skin: Skin is warm and dry. No rash noted.  Psychiatric: She has a normal mood and affect. Her behavior is normal.    ED Course  Procedures   Results for orders placed during the hospital encounter of 06/07/13  CBC WITH DIFFERENTIAL      Result Value Range   WBC 5.6  4.0 - 10.5 K/uL   RBC 4.36  3.87 - 5.11 MIL/uL   Hemoglobin 13.0  12.0 - 15.0 g/dL   HCT 13.0  86.5 - 78.4 %   MCV 87.6  78.0 - 100.0 fL   MCH 29.8  26.0 - 34.0 pg   MCHC 34.0  30.0 - 36.0 g/dL   RDW 69.6  29.5 - 28.4 %   Platelets 220  150 - 400 K/uL   Neutrophils Relative % 50  43 - 77 %   Neutro Abs 2.8  1.7 - 7.7 K/uL   Lymphocytes Relative 38  12 - 46 %   Lymphs Abs 2.1  0.7 - 4.0 K/uL   Monocytes Relative 9  3 - 12 %   Monocytes Absolute 0.5  0.1 - 1.0 K/uL   Eosinophils Relative 2  0 - 5 %   Eosinophils Absolute 0.1  0.0 - 0.7 K/uL   Basophils Relative 0  0 - 1 %   Basophils Absolute 0.0  0.0 - 0.1 K/uL  BASIC METABOLIC PANEL      Result Value Range   Sodium 142  135 - 145 mEq/L   Potassium 3.6  3.5 - 5.1 mEq/L   Chloride 107  96 - 112 mEq/L   CO2 25  19 - 32 mEq/L   Glucose, Bld 98  70 - 99 mg/dL   BUN 7  6 - 23 mg/dL   Creatinine, Ser 1.32  0.50 - 1.10 mg/dL   Calcium 8.6  8.4 - 44.0 mg/dL   GFR calc non Af Amer >90  >90 mL/min   GFR calc Af Amer >90  >90 mL/min  PROTIME-INR      Result Value Range   Prothrombin Time 13.9  11.6 - 15.2 seconds   INR 1.09  0.00 - 1.49  URINALYSIS, ROUTINE W REFLEX MICROSCOPIC      Result Value Range   Color, Urine YELLOW  YELLOW   APPearance CLEAR  CLEAR   Specific Gravity, Urine 1.005  1.005 - 1.030   pH 5.5  5.0 - 8.0   Glucose, UA NEGATIVE  NEGATIVE mg/dL   Hgb urine dipstick NEGATIVE  NEGATIVE   Bilirubin Urine NEGATIVE  NEGATIVE    Ketones, ur NEGATIVE  NEGATIVE mg/dL   Protein, ur NEGATIVE  NEGATIVE mg/dL   Urobilinogen, UA 0.2  0.0 - 1.0 mg/dL   Nitrite NEGATIVE  NEGATIVE   Leukocytes, UA NEGATIVE  NEGATIVE  Imaging Review Dg Chest 2 View  06/08/2013   CLINICAL DATA:  Back pain and headache.  EXAM: CHEST  2 VIEW  COMPARISON:  PA and lateral chest 05/28/2012 and CT chest 05/30/2012.  FINDINGS: Bilateral upper lobe and apical scarring, worse on the right, appears unchanged. The lungs are otherwise clear. Heart size is normal. No pneumothorax or pleural fluid.  IMPRESSION: No acute finding. Stable compared to prior exam.   Electronically Signed   By: Drusilla Kanner M.D.   On: 06/08/2013 01:31   Ct Angio Chest W/cm &/or Wo Cm  06/08/2013   CLINICAL DATA:  Difficulty breathing. Decreased lung sounds on the right. Chest pain.  EXAM: CT ANGIOGRAPHY CHEST WITH CONTRAST  TECHNIQUE: Multidetector CT imaging of the chest was performed using the standard protocol during bolus administration of intravenous contrast. Multiplanar CT image reconstructions including MIPs were obtained to evaluate the vascular anatomy.  CONTRAST:  OMNIPAQUE IOHEXOL 350 MG/ML SOLN  COMPARISON:  Plain films of the chest 06/08/2013 and 05/28/2012. CT chest 05/30/2012.  FINDINGS: No pulmonary embolus is identified. Large low attenuating lesion in the left lobe of the thyroid all measuring 2.5 cm is unchanged. No axillary, hilar or mediastinal lymphadenopathy is identified. There is no pleural or pericardial effusion. Ectasia of the ascending aorta is unchanged.  Lungs again demonstrate sequela of prior tuberculosis with biapical scarring, worse on the right, where there is some cavitation present. A 0.6 cm right lower lobe nodule is unchanged on image 32. Second right lower lobe nodule measuring 0.5 cm is unchanged on image 37. No new or enlarging pulmonary nodules are identified.  Visualized upper abdomen demonstrates fatty infiltration of the liver.  The patient is status post cholecystectomy. No focal bony abnormality is identified.  Review of the MIP images confirms the above findings.  IMPRESSION: Negative for pulmonary embolus or acute cardiopulmonary disease.  No change in the appearance of the lungs with sequela of remote tuberculosis again seen.  Fatty infiltration of the liver.   Electronically Signed   By: Drusilla Kanner M.D.   On: 06/08/2013 04:20    MDM   1. Headache   2. Myalgia   3. Hypertension       1:05AM patient seen and evaluated. She is resting appears comfortable in no acute distress. Blood pressure currently 150/110. This has improved slightly at rest compared to the initial triage. Patient not taking her normal blood pressure medications.  Patient has felt much better after receiving out of [place]. Headache improved significantly as well as body aches.   Labs unremarkable. No signs of PE on CT scan. Unremarkable ECG. At this time we'll recommend continued treatment with ibuprofen for her headache and body ache symptoms. We'll also give prescription to treat her blood pressure, patient was previously on lisinopril/HCTZ 10-12.5mg . Patient and family strongly encouraged to follow up with PCP for continued treatment of her medical conditions.     Date: 06/08/2013  Rate: 87  Rhythm: normal sinus rhythm  QRS Axis: normal  Intervals: normal  ST/T Wave abnormalities: normal  Conduction Disutrbances:none  Narrative Interpretation:   Old EKG Reviewed: unchanged    Angus Seller, PA-C 06/08/13 [phone]

## 2013-06-08 NOTE — ED Provider Notes (Signed)
Medical screening examination/treatment/procedure(s) were performed by non-physician practitioner and as supervising physician I was immediately available for consultation/collaboration.   Naiyah Klostermann, MD 06/08/13 0647 

## 2013-06-08 NOTE — ED Notes (Signed)
Patient transported to CT 

## 2013-06-21 ENCOUNTER — Ambulatory Visit: Payer: Medicaid Other | Admitting: Endocrinology

## 2013-07-24 ENCOUNTER — Encounter (HOSPITAL_COMMUNITY): Payer: Self-pay | Admitting: Emergency Medicine

## 2013-07-24 ENCOUNTER — Emergency Department (INDEPENDENT_AMBULATORY_CARE_PROVIDER_SITE_OTHER)
Admission: EM | Admit: 2013-07-24 | Discharge: 2013-07-24 | Disposition: A | Discharge status: Home / Self Care | Payer: Medicaid Other | Source: Home / Self Care | Attending: Family Medicine | Admitting: Family Medicine

## 2013-07-24 DIAGNOSIS — I10 Essential (primary) hypertension: Secondary | ICD-10-CM

## 2013-07-24 LAB — POCT I-STAT, CHEM 8
Calcium, Ion: 1.2 mmol/L (ref 1.12–1.23)
Creatinine, Ser: 0.8 mg/dL (ref 0.50–1.10)
Hemoglobin: 14.3 g/dL (ref 12.0–15.0)
Sodium: 142 mEq/L (ref 135–145)
TCO2: 22 mmol/L (ref 0–100)

## 2013-07-24 MED ORDER — LISINOPRIL-HYDROCHLOROTHIAZIDE 20-12.5 MG PO TABS: 1.0000 | ORAL_TABLET | Freq: Every day | ORAL | Status: DC

## 2013-07-24 NOTE — ED Provider Notes (Signed)
CSN: [id number]     Arrival date & time 07/24/13  1420 History   First MD Initiated Contact with Patient 07/24/13 1444     Chief Complaint  Patient presents with  . Medication Refill   (Consider location/radiation/quality/duration/timing/severity/associated sxs/prior Treatment) Patient is a 57 y.o. female presenting with hypertension. The history is provided by the patient and a relative. The history is limited by a language barrier.  Hypertension This is a chronic problem. The current episode started more than 1 week ago. The problem has not changed since onset.Pertinent negatives include no chest pain and no abdominal pain.    Past Medical History  Diagnosis Date  . Hypertension   . Chronic pain     right hip  . Tuberculosis ~ 1980    "took medicine for it 6 months"  . Leg DVT (deep venous thromboembolism), chronic 2009; 06/02/2012    "always left"  . Anginal pain 05/30/2012  . Shortness of breath 05/30/2012   Past Surgical History  Procedure Laterality Date  . Vena cava filter placement  06/02/12   No family history on file. History  Substance Use Topics  . Smoking status: Never Smoker   . Smokeless tobacco: Never Used  . Alcohol Use: No   OB History   Grav Para Term Preterm Abortions TAB SAB Ect Mult Living                 Review of Systems  Constitutional: Negative.   Respiratory: Negative for cough and chest tightness.   Cardiovascular: Negative for chest pain.  Gastrointestinal: Negative for abdominal pain.    Allergies  Review of patient's allergies indicates no known allergies.  Home Medications   Current Outpatient Rx  Name  Route  Sig  Dispense  Refill  . Acetaminophen (TYLENOL PO)   Oral   Take 2 tablets by mouth as needed (pain).         Marland Kitchen ibuprofen (ADVIL,MOTRIN) 800 MG tablet   Oral   Take 1 tablet (800 mg total) by mouth every 8 (eight) hours as needed for pain.   30 tablet   0   . lisinopril-hydrochlorothiazide (PRINZIDE) 10-12.5 MG per  tablet   Oral   Take 1 tablet by mouth daily.   30 tablet   0   . lisinopril-hydrochlorothiazide (PRINZIDE,ZESTORETIC) 20-12.5 MG per tablet   Oral   Take 1 tablet by mouth daily. For blood pressure.   30 tablet   1    BP 152/104  Pulse 97  Temp(Src) 97.3 F (36.3 C) (Oral)  Resp 20  SpO2 97% Physical Exam  Nursing note and vitals reviewed. Constitutional: She is oriented to person, place, and time. She appears well-developed and well-nourished.  Neck: Normal range of motion. Neck supple.  Cardiovascular: Normal rate, regular rhythm, normal heart sounds and intact distal pulses.   Pulmonary/Chest: Effort normal and breath sounds normal.  Musculoskeletal: She exhibits no edema.  Neurological: She is alert and oriented to person, place, and time.  Skin: Skin is warm and dry.    ED Course  Procedures (including critical care time) Labs Review Labs Reviewed  POCT I-STAT, CHEM 8 - Abnormal; Notable for the following:    Glucose, Bld 108 (*)    All other components within normal limits   Imaging Review No results found.  EKG Interpretation     Ventricular Rate:    PR Interval:    QRS Duration:   QT Interval:    QTC Calculation:   R  Axis:     Text Interpretation:              MDM  i-stat wnl.    Linna Hoff, MD 07/24/13 [phone]

## 2013-07-24 NOTE — ED Notes (Signed)
57 yr old c/o medication refill for hypertension. She states she has been out her medication for ten days. She states she takes the Ibuprofen 800 mg for chronic back pain and body aches. No other complaints.

## 2013-08-26 ENCOUNTER — Encounter (HOSPITAL_COMMUNITY): Payer: Self-pay | Admitting: Emergency Medicine

## 2013-08-26 ENCOUNTER — Emergency Department (HOSPITAL_COMMUNITY)
Admission: EM | Admit: 2013-08-26 | Discharge: 2013-08-26 | Disposition: A | Discharge status: Home / Self Care | Payer: Medicaid Other | Source: Home / Self Care | Attending: Family Medicine | Admitting: Family Medicine

## 2013-08-26 DIAGNOSIS — I10 Essential (primary) hypertension: Secondary | ICD-10-CM

## 2013-08-26 DIAGNOSIS — M549 Dorsalgia, unspecified: Secondary | ICD-10-CM

## 2013-08-26 LAB — POCT URINALYSIS DIP (DEVICE)
Bilirubin Urine: NEGATIVE
Glucose, UA: NEGATIVE mg/dL
Hgb urine dipstick: NEGATIVE
Ketones, ur: NEGATIVE mg/dL
Leukocytes, UA: NEGATIVE
Nitrite: NEGATIVE
Protein, ur: NEGATIVE mg/dL
Specific Gravity, Urine: 1.025 (ref 1.005–1.030)
Urobilinogen, UA: 0.2 mg/dL (ref 0.0–1.0)
pH: 5.5 (ref 5.0–8.0)

## 2013-08-26 MED ORDER — MELOXICAM 7.5 MG PO TABS: 7.5000 mg | ORAL_TABLET | Freq: Every day | ORAL | Status: DC

## 2013-08-26 MED ORDER — LISINOPRIL-HYDROCHLOROTHIAZIDE 20-12.5 MG PO TABS: 1.0000 | ORAL_TABLET | Freq: Every day | ORAL | Status: DC

## 2013-08-26 NOTE — ED Notes (Signed)
Bed: UC05 Expected date:  Expected time:  Means of arrival:  Comments: 

## 2013-08-26 NOTE — ED Notes (Signed)
C/o lower back pain/pelvic. X 1 wk with urinary urgency. Pt also needing refill on BP medication.  Pt states has one pill left.

## 2013-08-26 NOTE — ED Provider Notes (Signed)
CSN: [id number]     Arrival date & time 08/26/13  1409 History   First MD Initiated Contact with Patient 08/26/13 1541     Chief Complaint  Patient presents with  . Medication Refill  . Back Pain   (Consider location/radiation/quality/duration/timing/severity/associated sxs/prior Treatment) Patient is a 57 y.o. female presenting with back pain. The history is provided by the patient. No language interpreter was used.  Back Pain Location:  Lumbar spine Quality:  Aching Radiates to:  Does not radiate Pain severity:  Moderate Duration:  1 week Timing:  Constant Chronicity:  New Relieved by:  Nothing Worsened by:  Nothing tried Associated symptoms: abdominal pain   Risk factors: not pregnant     Past Medical History  Diagnosis Date  . Hypertension   . Chronic pain     right hip  . Tuberculosis ~ 1980    "took medicine for it 6 months"  . Leg DVT (deep venous thromboembolism), chronic 2009; 06/02/2012    "always left"  . Anginal pain 05/30/2012  . Shortness of breath 05/30/2012   Past Surgical History  Procedure Laterality Date  . Vena cava filter placement  06/02/12   History reviewed. No pertinent family history. History  Substance Use Topics  . Smoking status: Never Smoker   . Smokeless tobacco: Never Used  . Alcohol Use: No   OB History   Grav Para Term Preterm Abortions TAB SAB Ect Mult Living                 Review of Systems  Gastrointestinal: Positive for abdominal pain.  Musculoskeletal: Positive for back pain.  All other systems reviewed and are negative.    Allergies  Review of patient's allergies indicates no known allergies.  Home Medications   Current Outpatient Rx  Name  Route  Sig  Dispense  Refill  . lisinopril-hydrochlorothiazide (PRINZIDE,ZESTORETIC) 20-12.5 MG per tablet   Oral   Take 1 tablet by mouth daily. For blood pressure.   30 tablet   1   . Acetaminophen (TYLENOL PO)   Oral   Take 2 tablets by mouth as needed (pain).        Marland Kitchen ibuprofen (ADVIL,MOTRIN) 800 MG tablet   Oral   Take 1 tablet (800 mg total) by mouth every 8 (eight) hours as needed for pain.   30 tablet   0   . lisinopril-hydrochlorothiazide (PRINZIDE) 10-12.5 MG per tablet   Oral   Take 1 tablet by mouth daily.   30 tablet   0    BP 99/76  Pulse 97  Temp(Src) 98.3 F (36.8 C) (Oral)  Resp 16  SpO2 100% Physical Exam  Nursing note and vitals reviewed. Constitutional: She is oriented to person, place, and time. She appears well-developed and well-nourished.  HENT:  Head: Normocephalic.  Eyes: Conjunctivae and EOM are normal. Pupils are equal, round, and reactive to light.  Neck: Normal range of motion.  Cardiovascular: Normal rate and regular rhythm.   Pulmonary/Chest: Effort normal and breath sounds normal.  Abdominal: Soft. She exhibits no distension.  Musculoskeletal:  Tender ls spine  Neurological: She is alert and oriented to person, place, and time.  Skin: Skin is warm.  Psychiatric: She has a normal mood and affect.    ED Course  Procedures (including critical care time) Labs Review Labs Reviewed  POCT URINALYSIS DIP (DEVICE)   Imaging Review No results found.  EKG Interpretation    Date/Time:    Ventricular Rate:    PR  Interval:    QRS Duration:   QT Interval:    QTC Calculation:   R Axis:     Text Interpretation:              MDM   1. HTN (hypertension)   2. Back pain     Rx for prinzide and meloxicam   Pt given number for wellness clinic  Lonia Skinner [place], [place] 08/26/13 1603

## 2013-08-27 NOTE — ED Provider Notes (Signed)
Medical screening examination/treatment/procedure(s) were performed by a resident physician or non-physician practitioner and as the supervising physician I was immediately available for consultation/collaboration.  Omario Ander, MD    Amory Zbikowski S Chessica Audia, MD 08/27/13 1456 

## 2014-01-06 ENCOUNTER — Emergency Department (HOSPITAL_COMMUNITY)
Admission: EM | Admit: 2014-01-06 | Discharge: 2014-01-06 | Disposition: A | Discharge status: Home / Self Care | Payer: Medicaid Other | Source: Home / Self Care | Attending: Family Medicine | Admitting: Family Medicine

## 2014-01-06 ENCOUNTER — Encounter (HOSPITAL_COMMUNITY): Payer: Self-pay | Admitting: Emergency Medicine

## 2014-01-06 DIAGNOSIS — M549 Dorsalgia, unspecified: Secondary | ICD-10-CM

## 2014-01-06 DIAGNOSIS — I10 Essential (primary) hypertension: Secondary | ICD-10-CM

## 2014-01-06 LAB — POCT I-STAT, CHEM 8
BUN: 10 mg/dL (ref 6–23)
CREATININE: 0.8 mg/dL (ref 0.50–1.10)
Calcium, Ion: 1.23 mmol/L (ref 1.12–1.23)
Chloride: 100 mEq/L (ref 96–112)
Glucose, Bld: 115 mg/dL — ABNORMAL HIGH (ref 70–99)
HCT: 44 % (ref 36.0–46.0)
HEMOGLOBIN: 15 g/dL (ref 12.0–15.0)
Potassium: 3.8 mEq/L (ref 3.7–5.3)
SODIUM: 141 meq/L (ref 137–147)
TCO2: 30 mmol/L (ref 0–100)

## 2014-01-06 MED ORDER — LISINOPRIL-HYDROCHLOROTHIAZIDE 10-12.5 MG PO TABS: 1.0000 | ORAL_TABLET | Freq: Every day | ORAL | Status: DC

## 2014-01-06 MED ORDER — DICLOFENAC SODIUM 50 MG PO TBEC: 50.0000 mg | DELAYED_RELEASE_TABLET | Freq: Two times a day (BID) | ORAL | Status: DC | PRN

## 2014-01-06 NOTE — ED Provider Notes (Signed)
Ashley Jacobs is a 58 y.o. female who presents to Urgent Care today for blood pressure medication refill and back pain. 1) hypertension: Patient has ongoing hypertension. Her medication has been filled by various urgent cares. She is medicated however her provider is listed on her Medicaid card is no longer accepting patients. She denies any chest pains palpitations or shortness of breath. She does however note dizziness especially with standing. She took her last medication yesterday. 2) back pain: Ongoing chronic low back pain. Pain is located on the left side. She denies any radiating pain weakness numbness bowel bladder dysfunction. The pain is been worse over the past 4 days. The pain is moderate. The pain is worst activity better with rest. She has not tried any medications yet.   Past Medical History  Diagnosis Date  . Hypertension   . Chronic pain     right hip  . Tuberculosis ~ 1980    "took medicine for it 6 months"  . Leg DVT (deep venous thromboembolism), chronic 2009; 06/02/2012    "always left"  . Anginal pain 05/30/2012  . Shortness of breath 05/30/2012   History  Substance Use Topics  . Smoking status: Never Smoker   . Smokeless tobacco: Never Used  . Alcohol Use: No   ROS as above Medications: No current facility-administered medications for this encounter.   Current Outpatient Prescriptions  Medication Sig Dispense Refill  . ibuprofen (ADVIL,MOTRIN) 800 MG tablet Take 1 tablet (800 mg total) by mouth every 8 (eight) hours as needed for pain.  30 tablet  0  . Acetaminophen (TYLENOL PO) Take 2 tablets by mouth as needed (pain).      Marland Kitchen. diclofenac (VOLTAREN) 50 MG EC tablet Take 1 tablet (50 mg total) by mouth 2 (two) times daily as needed.  60 tablet  0  . lisinopril-hydrochlorothiazide (PRINZIDE) 10-12.5 MG per tablet Take 1 tablet by mouth daily.  30 tablet  0  . lisinopril-hydrochlorothiazide (ZESTORETIC) 10-12.5 MG per tablet Take 1 tablet by mouth daily.  30 tablet   1  . meloxicam (MOBIC) 7.5 MG tablet Take 1 tablet (7.5 mg total) by mouth daily.  30 tablet  0    Exam:  BP 103/81  Pulse 84  Temp(Src) 98.1 F (36.7 C) (Oral)  Resp 16  SpO2 100% Gen: Well NAD HEENT: EOMI,  MMM Lungs: Normal work of breathing. CTABL Heart: RRR no MRG Abd: NABS, Soft. NT, ND Exts: Brisk capillary refill, warm and well perfused.  Back: Nontender to spinal midline. Tender palpation left SI joint. Reflexes strength and sensation are intact bilateral lower extremities. Normal gait.  Results for orders placed during the hospital encounter of 01/06/14 (from the past 24 hour(s))  POCT I-STAT, CHEM 8     Status: Abnormal   Collection Time    01/06/14  1:07 PM      Result Value Ref Range   Sodium 141  137 - 147 mEq/L   Potassium 3.8  3.7 - 5.3 mEq/L   Chloride 100  96 - 112 mEq/L   BUN 10  6 - 23 mg/dL   Creatinine, Ser 1.610.80  0.50 - 1.10 mg/dL   Glucose, Bld 096115 (*) 70 - 99 mg/dL   Calcium, Ion 0.451.23  4.091.12 - 1.23 mmol/L   TCO2 30  0 - 100 mmol/L   Hemoglobin 15.0  12.0 - 15.0 g/dL   HCT 81.144.0  91.436.0 - 78.246.0 %   No results found.  Assessment and Plan: 58 y.o.  female with  1) hypertension: Well controlled. Blood pressure slightly low. Decreased dose from 20/12.5-10/12.5. Followup with primary care provider 2) back pain: Mild facet disruption. Plan to treat with voltaren. Followup with primary care provider.  Discussed warning signs or symptoms. Please see discharge instructions. Patient expresses understanding.    Rodolph BongEvan S Elior Robinette, MD 01/06/14 [phone]

## 2014-01-06 NOTE — Discharge Instructions (Signed)
Thank you for coming in today. Come back or go to the emergency room if you notice new weakness new numbness problems walking or bowel or bladder problems. Call or go to the emergency room if you get worse, have trouble breathing, have chest pains, or palpitations.   Arterial Hypertension Arterial hypertension (high blood pressure) is a condition of elevated pressure in your blood vessels. Hypertension over a long period of time is a risk factor for strokes, heart attacks, and heart failure. It is also the leading cause of kidney (renal) failure.  CAUSES   In Adults -- Over 90% of all hypertension has no known cause. This is called essential or primary hypertension. In the other 10% of people with hypertension, the increase in blood pressure is caused by another disorder. This is called secondary hypertension. Important causes of secondary hypertension are:  Heavy alcohol use.  Obstructive sleep apnea.  Hyperaldosterosim (Conn's syndrome).  Steroid use.  Chronic kidney failure.  Hyperparathyroidism.  Medications.  Renal artery stenosis.  Pheochromocytoma.  Cushing's disease.  Coarctation of the aorta.  Scleroderma renal crisis.  Licorice (in excessive amounts).  Drugs (cocaine, methamphetamine). Your caregiver can explain any items above that apply to you.  In Children -- Secondary hypertension is more common and should always be considered.  Pregnancy -- Few women of childbearing age have high blood pressure. However, up to 10% of them develop hypertension of pregnancy. Generally, this will not harm the woman. It may be a sign of 3 complications of pregnancy: preeclampsia, HELLP syndrome, and eclampsia. Follow up and control with medication is necessary. SYMPTOMS   This condition normally does not produce any noticeable symptoms. It is usually found during a routine exam.  Malignant hypertension is a late problem of high blood pressure. It may have the following  symptoms:  Headaches.  Blurred vision.  End-organ damage (this means your kidneys, heart, lungs, and other organs are being damaged).  Stressful situations can increase the blood pressure. If a person with normal blood pressure has their blood pressure go up while being seen by their caregiver, this is often termed "white coat hypertension." Its importance is not known. It may be related with eventually developing hypertension or complications of hypertension.  Hypertension is often confused with mental tension, stress, and anxiety. DIAGNOSIS  The diagnosis is made by 3 separate blood pressure measurements. They are taken at least 1 week apart from each other. If there is organ damage from hypertension, the diagnosis may be made without repeat measurements. Hypertension is usually identified by having blood pressure readings:  Above 140/90 mmHg measured in both arms, at 3 separate times, over a couple weeks.  Over 130/80 mmHg should be considered a risk factor and may require treatment in patients with diabetes. Blood pressure readings over 120/80 mmHg are called "pre-hypertension" even in non-diabetic patients. To get a true blood pressure measurement, use the following guidelines. Be aware of the factors that can alter blood pressure readings.  Take measurements at least 1 hour after caffeine.  Take measurements 30 minutes after smoking and without any stress. This is another reason to quit smoking  it raises your blood pressure.  Use a proper cuff size. Ask your caregiver if you are not sure about your cuff size.  Most home blood pressure cuffs are automatic. They will measure systolic and diastolic pressures. The systolic pressure is the pressure reading at the start of sounds. Diastolic pressure is the pressure at which the sounds disappear. If you are  elderly, measure pressures in multiple postures. Try sitting, lying or standing.  Sit at rest for a minimum of 5 minutes before  taking measurements.  You should not be on any medications like decongestants. These are found in many cold medications.  Record your blood pressure readings and review them with your caregiver. If you have hypertension:  Your caregiver may do tests to be sure you do not have secondary hypertension (see "causes" above).  Your caregiver may also look for signs of metabolic syndrome. This is also called Syndrome X or Insulin Resistance Syndrome. You may have this syndrome if you have type 2 diabetes, abdominal obesity, and abnormal blood lipids in addition to hypertension.  Your caregiver will take your medical and family history and perform a physical exam.  Diagnostic tests may include blood tests (for glucose, cholesterol, potassium, and kidney function), a urinalysis, or an EKG. Other tests may also be necessary depending on your condition. PREVENTION  There are important lifestyle issues that you can adopt to reduce your chance of developing hypertension:  Maintain a normal weight.  Limit the amount of salt (sodium) in your diet.  Exercise often.  Limit alcohol intake.  Get enough potassium in your diet. Discuss specific advice with your caregiver.  Follow a DASH diet (dietary approaches to stop hypertension). This diet is rich in fruits, vegetables, and low-fat dairy products, and avoids certain fats. PROGNOSIS  Essential hypertension cannot be cured. Lifestyle changes and medical treatment can lower blood pressure and reduce complications. The prognosis of secondary hypertension depends on the underlying cause. Many people whose hypertension is controlled with medicine or lifestyle changes can live a normal, healthy life.  RISKS AND COMPLICATIONS  While high blood pressure alone is not an illness, it often requires treatment due to its short- and long-term effects on many organs. Hypertension increases your risk for:  CVAs or strokes (cerebrovascular accident).  Heart failure  due to chronically high blood pressure (hypertensive cardiomyopathy).  Heart attack (myocardial infarction).  Damage to the retina (hypertensive retinopathy).  Kidney failure (hypertensive nephropathy). Your caregiver can explain list items above that apply to you. Treatment of hypertension can significantly reduce the risk of complications. TREATMENT   For overweight patients, weight loss and regular exercise are recommended. Physical fitness lowers blood pressure.  Mild hypertension is usually treated with diet and exercise. A diet rich in fruits and vegetables, fat-free dairy products, and foods low in fat and salt (sodium) can help lower blood pressure. Decreasing salt intake decreases blood pressure in a 1/3 of people.  Stop smoking if you are a smoker. The steps above are highly effective in reducing blood pressure. While these actions are easy to suggest, they are difficult to achieve. Most patients with moderate or severe hypertension end up requiring medications to bring their blood pressure down to a normal level. There are several classes of medications for treatment. Blood pressure pills (antihypertensives) will lower blood pressure by their different actions. Lowering the blood pressure by 10 mmHg may decrease the risk of complications by as much as 25%. The goal of treatment is effective blood pressure control. This will reduce your risk for complications. Your caregiver will help you determine the best treatment for you according to your lifestyle. What is excellent treatment for one person, may not be for you. HOME CARE INSTRUCTIONS   Do not smoke.  Follow the lifestyle changes outlined in the "Prevention" section.  If you are on medications, follow the directions carefully. Blood pressure medications  must be taken as prescribed. Skipping doses reduces their benefit. It also puts you at risk for problems.  Follow up with your caregiver, as directed.  If you are asked to  monitor your blood pressure at home, follow the guidelines in the "Diagnosis" section above. SEEK MEDICAL CARE IF:   You think you are having medication side effects.  You have recurrent headaches or lightheadedness.  You have swelling in your ankles.  You have trouble with your vision. SEEK IMMEDIATE MEDICAL CARE IF:   You have sudden onset of chest pain or pressure, difficulty breathing, or other symptoms of a heart attack.  You have a severe headache.  You have symptoms of a stroke (such as sudden weakness, difficulty speaking, difficulty walking). MAKE SURE YOU:   Understand these instructions.  Will watch your condition.  Will get help right away if you are not doing well or get worse. Document Released: 09/01/2005 Document Revised: 11/24/2011 Document Reviewed: 04/01/2007 [facility] LPExitCare Patient Information 2014 [place], [place].  Back Exercises Back exercises help treat and prevent back injuries. The goal is to increase your strength in your belly (abdominal) and back muscles. These exercises can also help with flexibility. Start these exercises when told by your doctor. HOME CARE Back exercises include: Pelvic Tilt.  Lie on your back with your knees bent. Tilt your pelvis until the lower part of your back is against the floor. Hold this position 5 to 10 sec. Repeat this exercise 5 to 10 times. Knee to Chest.  Pull 1 knee up against your chest and hold for 20 to 30 seconds. Repeat this with the other knee. This may be done with the other leg straight or bent, whichever feels better. Then, pull both knees up against your chest. Sit-Ups or Curl-Ups.  Bend your knees 90 degrees. Start with tilting your pelvis, and do a partial, slow sit-up. Only lift your upper half 30 to 45 degrees off the floor. Take at least 2 to 3 seonds for each sit-up. Do not do sit-ups with your knees out straight. If partial sit-ups are difficult, simply do the above but with only tightening your belly  (abdominal) muscles and holding it as told. Hip-Lift.  Lie on your back with your knees flexed 90 degrees. Push down with your feet and shoulders as you raise your hips 2 inches off the floor. Hold for 10 seconds, repeat 5 to 10 times. Back Arches.  Lie on your stomach. Prop yourself up on bent elbows. Slowly press on your hands, causing an arch in your low back. Repeat 3 to 5 times. Shoulder-Lifts.  Lie face down with arms beside your body. Keep hips and belly pressed to floor as you slowly lift your head and shoulders off the floor. Do not overdo your exercises. Be careful in the beginning. Exercises may cause you some mild back discomfort. If the pain lasts for more than 15 minutes, stop the exercises until you see your doctor. Improvement with exercise for back problems is slow.  Document Released: 10/04/2010 Document Revised: 11/24/2011 Document Reviewed: 07/03/2011 [facility] Patient Information 2014 [place]ExitCare, [place]LL[place].

## 2014-01-06 NOTE — ED Notes (Signed)
58 yr old woman is here with her daughter translating requesting refill of blood pressure medication.  Denies: SOB, chest pain

## 2014-01-20 ENCOUNTER — Ambulatory Visit: Payer: Medicaid Other | Attending: Internal Medicine | Admitting: Internal Medicine

## 2014-01-20 ENCOUNTER — Encounter: Payer: Self-pay | Admitting: Internal Medicine

## 2014-01-20 VITALS — BP 139/93 | HR 73 | Temp 97.9°F | Resp 14 | Ht 61.0 in | Wt 137.0 lb

## 2014-01-20 DIAGNOSIS — M25559 Pain in unspecified hip: Secondary | ICD-10-CM | POA: Insufficient documentation

## 2014-01-20 DIAGNOSIS — Z86718 Personal history of other venous thrombosis and embolism: Secondary | ICD-10-CM | POA: Insufficient documentation

## 2014-01-20 DIAGNOSIS — Z79899 Other long term (current) drug therapy: Secondary | ICD-10-CM | POA: Insufficient documentation

## 2014-01-20 DIAGNOSIS — I209 Angina pectoris, unspecified: Secondary | ICD-10-CM | POA: Insufficient documentation

## 2014-01-20 DIAGNOSIS — M5380 Other specified dorsopathies, site unspecified: Secondary | ICD-10-CM | POA: Insufficient documentation

## 2014-01-20 DIAGNOSIS — I10 Essential (primary) hypertension: Secondary | ICD-10-CM | POA: Insufficient documentation

## 2014-01-20 DIAGNOSIS — R42 Dizziness and giddiness: Secondary | ICD-10-CM | POA: Insufficient documentation

## 2014-01-20 DIAGNOSIS — Z8611 Personal history of tuberculosis: Secondary | ICD-10-CM | POA: Insufficient documentation

## 2014-01-20 DIAGNOSIS — G8929 Other chronic pain: Secondary | ICD-10-CM | POA: Insufficient documentation

## 2014-01-20 DIAGNOSIS — M549 Dorsalgia, unspecified: Secondary | ICD-10-CM | POA: Insufficient documentation

## 2014-01-20 LAB — POCT URINALYSIS DIPSTICK
Blood, UA: NEGATIVE
Glucose, UA: NEGATIVE
Ketones, UA: NEGATIVE
Leukocytes, UA: NEGATIVE
Nitrite, UA: NEGATIVE
PROTEIN UA: 30
Urobilinogen, UA: 0.2
pH, UA: 6

## 2014-01-20 MED ORDER — CYCLOBENZAPRINE HCL 10 MG PO TABS: 10.0000 mg | ORAL_TABLET | Freq: Three times a day (TID) | ORAL | Status: DC | PRN

## 2014-01-20 MED ORDER — MECLIZINE HCL 25 MG PO TABS: 25.0000 mg | ORAL_TABLET | Freq: Two times a day (BID) | ORAL | Status: DC

## 2014-01-20 NOTE — Progress Notes (Signed)
Pt is here to establish care. Pt reports having back pain that has now radiated to her abdomen. Pt has a history of HTN.

## 2014-01-23 NOTE — Progress Notes (Signed)
Patient ID: Ashley Jacobs, female   DOB: 24-Feb-1956, 58 y.o.   MRN: [id number]  [id number]  [id number]  DOB - 24-Feb-1956  CC:  Chief Complaint  Patient presents with  . Establish Care       HPI: Ashley Jacobs is a 58 y.o. female here today to establish medical care. Patient reports back pain that radiates to her stomach, and it is constant pain. She is unable to describe the pain or tell me when the pain began. She reports forgetfulness, dizziness, headaches, nausea, tinnitus.  She reports the symptoms have been going on for 2 weeks constantly.  Patient is a poor historian and gives vague symptoms with little detail. Patient does require interpreter throughout exam.   No Known Allergies Past Medical History  Diagnosis Date  . Hypertension   . Chronic pain     right hip  . Tuberculosis ~ 1980    "took medicine for it 6 months"  . Leg DVT (deep venous thromboembolism), chronic 2009; 06/02/2012    "always left"  . Anginal pain 05/30/2012  . Shortness of breath 05/30/2012   Current Outpatient Prescriptions on File Prior to Visit  Medication Sig Dispense Refill  . diclofenac (VOLTAREN) 50 MG EC tablet Take 1 tablet (50 mg total) by mouth 2 (two) times daily as needed.  60 tablet  0  . lisinopril-hydrochlorothiazide (PRINZIDE) 10-12.5 MG per tablet Take 1 tablet by mouth daily.  30 tablet  0  . Acetaminophen (TYLENOL PO) Take 2 tablets by mouth as needed (pain).      Marland Kitchen. ibuprofen (ADVIL,MOTRIN) 800 MG tablet Take 1 tablet (800 mg total) by mouth every 8 (eight) hours as needed for pain.  30 tablet  0  . lisinopril-hydrochlorothiazide (ZESTORETIC) 10-12.5 MG per tablet Take 1 tablet by mouth daily.  30 tablet  1  . meloxicam (MOBIC) 7.5 MG tablet Take 1 tablet (7.5 mg total) by mouth daily.  30 tablet  0   No current facility-administered medications on file prior to visit.   History reviewed. No pertinent family history. History   Social History  . Marital Status: Married    Spouse Name: N/A    Number of Children: N/A  . Years of Education: N/A   Occupational History  . Not on file.   Social History Main Topics  . Smoking status: Never Smoker   . Smokeless tobacco: Never Used  . Alcohol Use: No  . Drug Use: No  . Sexual Activity: No   Other Topics Concern  . Not on file   Social History Narrative  . No narrative on file    Review of Systems  Constitutional: Negative for fever and chills.  HENT: Positive for tinnitus.   Eyes: Positive for blurred vision.  Respiratory: Negative.   Cardiovascular: Negative for chest pain, palpitations and leg swelling.  Gastrointestinal: Positive for nausea and abdominal pain. Negative for heartburn and vomiting.  Genitourinary: Negative.   Musculoskeletal: Positive for back pain.  Skin: Negative.   Neurological: Positive for dizziness and headaches.  Endo/Heme/Allergies: Negative.   Psychiatric/Behavioral: Negative.       Objective:   Filed Vitals:   01/20/14 1412  BP: 139/93  Pulse: 73  Temp: 97.9 F (36.6 C)  Resp: 14    Physical Exam: Constitutional: Patient appears well-developed and well-nourished. No distress. HENT: Normocephalic, atraumatic, External right and left ear normal. Oropharynx is clear and moist.  Eyes: Conjunctivae and EOM are normal. PERRLA, no scleral icterus. Neck: Normal ROM. Neck supple.  No JVD. No tracheal deviation. No thyromegaly. CVS: RRR, S1/S2 +, no murmurs, no gallops, no carotid bruit.  Pulmonary: Effort and breath sounds normal, no stridor, rhonchi, wheezes, rales.  Abdominal: Soft. BS +, no distension, tenderness, rebound or guarding.  Musculoskeletal: Normal range of motion. No edema and no tenderness.  Lymphadenopathy: No lymphadenopathy noted, cervical Neuro: Alert. Normal reflexes, muscle tone coordination. No cranial nerve deficit. Skin: Skin is warm and dry. No rash noted. Not diaphoretic. No erythema. No pallor. Psychiatric: Normal mood and affect.  Behavior, judgment, thought content normal.  Lab Results  Component Value Date   WBC 5.6 06/08/2013   HGB 15.0 01/06/2014   HCT 44.0 01/06/2014   MCV 87.6 06/08/2013   PLT 220 06/08/2013   Lab Results  Component Value Date   CREATININE 0.80 01/06/2014   BUN 10 01/06/2014   NA 141 01/06/2014   K 3.8 01/06/2014   CL 100 01/06/2014   CO2 25 06/08/2013    No results found for this basename: HGBA1C   Lipid Panel     Component Value Date/Time   CHOL 175 04/03/2010 2145   TRIG 107 04/03/2010 2145   HDL 56 04/03/2010 2145   CHOLHDL 3.1 Ratio 04/03/2010 2145   VLDL 21 04/03/2010 2145   LDLCALC 98 04/03/2010 2145       Assessment and plan:   Ashley Jacobs was seen today for establish care.  Diagnoses and associated orders for this visit:  Back pain - Urinalysis Dipstick was negative - cyclobenzaprine (FLEXERIL) 10 MG tablet; Take 1 tablet (10 mg total) by mouth 3 (three) times daily as needed for muscle spasms.  Dizzy spells - meclizine (ANTIVERT) 25 MG tablet; Take 1 tablet (25 mg total) by mouth 2 (two) times daily.  Return in about 3 months (around 04/22/2014), or if symptoms worsen or fail to improve.    Interpreter was used to communicate directly with patient for the entire encounter including providing detailed patient instructions.     Holland CommonsValerie Keck, NP-C [facility] [phone] 01/23/2014, 7:51 PM

## 2014-02-16 ENCOUNTER — Ambulatory Visit: Payer: Medicaid Other | Attending: Internal Medicine | Admitting: Internal Medicine

## 2014-02-16 ENCOUNTER — Encounter: Payer: Self-pay | Admitting: Internal Medicine

## 2014-02-16 VITALS — BP 149/90 | HR 66 | Temp 97.8°F | Resp 16 | Wt 138.0 lb

## 2014-02-16 DIAGNOSIS — I10 Essential (primary) hypertension: Secondary | ICD-10-CM | POA: Insufficient documentation

## 2014-02-16 DIAGNOSIS — M549 Dorsalgia, unspecified: Secondary | ICD-10-CM | POA: Insufficient documentation

## 2014-02-16 DIAGNOSIS — R42 Dizziness and giddiness: Secondary | ICD-10-CM | POA: Insufficient documentation

## 2014-02-16 DIAGNOSIS — M25569 Pain in unspecified knee: Secondary | ICD-10-CM | POA: Insufficient documentation

## 2014-02-16 LAB — RHEUMATOID FACTOR: Rhuematoid fact SerPl-aCnc: <10 IU/mL (ref <=14)

## 2014-02-16 MED ORDER — LISINOPRIL-HYDROCHLOROTHIAZIDE 10-12.5 MG PO TABS: 1.0000 | ORAL_TABLET | Freq: Every day | ORAL | Status: DC

## 2014-02-16 MED ORDER — CYCLOBENZAPRINE HCL 10 MG PO TABS: 10.0000 mg | ORAL_TABLET | Freq: Three times a day (TID) | ORAL | Status: DC | PRN

## 2014-02-16 MED ORDER — MECLIZINE HCL 25 MG PO TABS: 25.0000 mg | ORAL_TABLET | Freq: Two times a day (BID) | ORAL | Status: DC

## 2014-02-16 MED ORDER — DICLOFENAC SODIUM 75 MG PO TBEC: 75.0000 mg | DELAYED_RELEASE_TABLET | Freq: Two times a day (BID) | ORAL | Status: DC

## 2014-02-16 NOTE — Progress Notes (Signed)
Patient ID: PSALM WOLFERT, female   DOB: 1956-02-16, 58 y.o.   MRN: [id number]  CC: back pain, dizziness  HPI:  1. Back pain that moves to the hips to legs. Sharp pain, hurts with walking and sitting. Better after ambulation in morning.  Going on for 1 month 2. Pain in bilateral knees- 6 months pain. Feels numb. Worst in morning and gets better throughout day. No redness or warmth or swelling.   3. Dry mouth and thirsty often for one month 4. Headaches. Been out of BP medication since yesterday.  Frontal headaches and neck pain. Only happens occassionally. 5. Memory issues- for 5 months. Feels that she is having a harder time remembering important things and events.   6. Dizziness- for 1 month.  Room spinning and feels like she is going to pass out.  No Known Allergies Past Medical History  Diagnosis Date  . Hypertension   . Chronic pain     right hip  . Tuberculosis ~ 1980    "took medicine for it 6 months"  . Leg DVT (deep venous thromboembolism), chronic 2009; 06/02/2012    "always left"  . Anginal pain 05/30/2012  . Shortness of breath 05/30/2012   Current Outpatient Prescriptions on File Prior to Visit  Medication Sig Dispense Refill  . Acetaminophen (TYLENOL PO) Take 2 tablets by mouth as needed (pain).      . cyclobenzaprine (FLEXERIL) 10 MG tablet Take 1 tablet (10 mg total) by mouth 3 (three) times daily as needed for muscle spasms.  30 tablet  1  . diclofenac (VOLTAREN) 50 MG EC tablet Take 1 tablet (50 mg total) by mouth 2 (two) times daily as needed.  60 tablet  0  . ibuprofen (ADVIL,MOTRIN) 800 MG tablet Take 1 tablet (800 mg total) by mouth every 8 (eight) hours as needed for pain.  30 tablet  0  . lisinopril-hydrochlorothiazide (PRINZIDE) 10-12.5 MG per tablet Take 1 tablet by mouth daily.  30 tablet  0  . lisinopril-hydrochlorothiazide (ZESTORETIC) 10-12.5 MG per tablet Take 1 tablet by mouth daily.  30 tablet  1  . meclizine (ANTIVERT) 25 MG tablet Take 1 tablet (25 mg  total) by mouth 2 (two) times daily.  30 tablet  0  . meloxicam (MOBIC) 7.5 MG tablet Take 1 tablet (7.5 mg total) by mouth daily.  30 tablet  0   No current facility-administered medications on file prior to visit.   History reviewed. No pertinent family history. History   Social History  . Marital Status: Married    Spouse Name: N/A    Number of Children: N/A  . Years of Education: N/A   Occupational History  . Not on file.   Social History Main Topics  . Smoking status: Never Smoker   . Smokeless tobacco: Never Used  . Alcohol Use: No  . Drug Use: No  . Sexual Activity: No   Other Topics Concern  . Not on file   Social History Narrative  . No narrative on file    Review of Systems: See HPI   Objective:   Filed Vitals:   02/16/14 1645  BP: 149/90  Pulse: 66  Temp: 97.8 F (36.6 C)  Resp: 16   Physical Exam  Vitals reviewed. Constitutional: She is oriented to person, place, and time.  HENT:  Right Ear: External ear normal.  Left Ear: External ear normal.  Eyes: EOM are normal. Pupils are equal, round, and reactive to light.  Neck: Normal range  of motion. Neck supple.  Cardiovascular: Normal rate, regular rhythm and normal heart sounds.   Pulmonary/Chest: Effort normal and breath sounds normal.  Abdominal: Soft. Bowel sounds are normal.  Musculoskeletal: Normal range of motion. She exhibits no edema and no tenderness.  Lymphadenopathy:    She has no cervical adenopathy.  Neurological: She is alert and oriented to person, place, and time. She has normal reflexes. No cranial nerve deficit.  Skin: Skin is warm and dry.     Lab Results  Component Value Date   WBC 5.6 06/08/2013   HGB 15.0 01/06/2014   HCT 44.0 01/06/2014   MCV 87.6 06/08/2013   PLT 220 06/08/2013   Lab Results  Component Value Date   CREATININE 0.80 01/06/2014   BUN 10 01/06/2014   NA 141 01/06/2014   K 3.8 01/06/2014   CL 100 01/06/2014   CO2 25 06/08/2013    No results found for  this basename: HGBA1C   Lipid Panel     Component Value Date/Time   CHOL 175 04/03/2010 2145   TRIG 107 04/03/2010 2145   HDL 56 04/03/2010 2145   CHOLHDL 3.1 Ratio 04/03/2010 2145   VLDL 21 04/03/2010 2145   LDLCALC 98 04/03/2010 2145       Assessment and plan:   [name] was seen today for back pain.  Diagnoses and associated orders for this visit:  Back pain - cyclobenzaprine (FLEXERIL) 10 MG tablet; Take 1 tablet (10 mg total) by mouth 3 (three) times daily as needed for muscle spasms. - diclofenac (VOLTAREN) 75 MG EC tablet; Take 1 tablet (75 mg total) by mouth 2 (two) times daily.  Dizzy spells - meclizine (ANTIVERT) 25 MG tablet; Take 1 tablet (25 mg total) by mouth 2 (two) times daily. - CT Head W Wo Contrast; Future  HTN (hypertension) - lisinopril-hydrochlorothiazide (PRINZIDE) 10-12.5 MG per tablet; Take 1 tablet by mouth daily.  Knee pain - ANA - Rheumatoid factor  F/u in 3 months for HTN      Ambrose FinlandValerie A Jafari Mckillop, NP-C [facility] [phone] 02/19/2014, 9:09 PM d

## 2014-02-16 NOTE — Progress Notes (Signed)
Patient here with interpreter Complains of knee pain bilaterally Back pain that extends to her hip Feels some mild dizziness

## 2014-02-17 ENCOUNTER — Telehealth: Payer: Self-pay

## 2014-02-17 LAB — ANA: ANA: NEGATIVE

## 2014-02-17 NOTE — Telephone Encounter (Signed)
Patient called to schedule CT of head Appointment made for June  10th  @11am  Son is aware of her appointment

## 2014-02-22 ENCOUNTER — Ambulatory Visit (HOSPITAL_COMMUNITY)
Admission: RE | Admit: 2014-02-22 | Discharge: 2014-02-22 | Disposition: A | Discharge status: Home / Self Care | Payer: Medicaid Other | Source: Ambulatory Visit | Attending: Internal Medicine | Admitting: Internal Medicine

## 2014-02-22 DIAGNOSIS — R42 Dizziness and giddiness: Secondary | ICD-10-CM

## 2014-02-22 DIAGNOSIS — R413 Other amnesia: Secondary | ICD-10-CM | POA: Insufficient documentation

## 2014-02-22 DIAGNOSIS — R51 Headache: Secondary | ICD-10-CM | POA: Insufficient documentation

## 2014-02-22 MED ORDER — IOHEXOL 300 MG/ML  SOLN
50.0000 mL | Freq: Once | INTRAMUSCULAR | Status: AC | PRN
  Administered 2014-02-22: 50 mL via INTRAVENOUS

## 2014-02-23 ENCOUNTER — Telehealth: Payer: Self-pay

## 2014-02-23 NOTE — Telephone Encounter (Signed)
Spoke with patient's daughter and she is aware of her Moms ct result-normal

## 2014-02-23 NOTE — Telephone Encounter (Signed)
Message copied by Lestine Mount on Thu Feb 23, 2014  2:13 PM ------      Message from: Holland Commons A      Created: Wed Feb 22, 2014  2:07 PM       Let patient know her CT is normal. Thanks ------

## 2014-03-21 ENCOUNTER — Other Ambulatory Visit: Payer: Self-pay | Admitting: Internal Medicine

## 2014-04-06 ENCOUNTER — Ambulatory Visit: Payer: Medicaid Other | Attending: Internal Medicine

## 2014-04-25 ENCOUNTER — Other Ambulatory Visit: Payer: Self-pay | Admitting: Internal Medicine

## 2014-05-12 ENCOUNTER — Ambulatory Visit: Payer: Self-pay | Attending: Internal Medicine | Admitting: *Deleted

## 2014-05-12 VITALS — BP 141/99 | HR 73 | Resp 16

## 2014-05-12 DIAGNOSIS — I10 Essential (primary) hypertension: Secondary | ICD-10-CM

## 2014-05-12 NOTE — Patient Instructions (Signed)
Hydrochlorothiazide, HCTZ; Lisinopril tablets What is this medicine? HYDROCHLOROTHIAZIDE; LISINOPRIL (hye droe klor oh THYE a zide; lyse IN oh pril) is a combination of a diuretic and an ACE inhibitor. It is used to treat high blood pressure. This medicine may be used for other purposes; ask your health care provider or pharmacist if you have questions. COMMON BRAND NAME(S): Prinzide, Zestoretic What should I tell my health care provider before I take this medicine? They need to know if you have any of these conditions: -bone marrow disease -decreased urine -heart or blood vessel disease -if you are on a special diet like a low salt diet -immune system problems, like lupus -kidney disease -liver disease -previous swelling of the tongue, face, or lips with difficulty breathing, difficulty swallowing, hoarseness, or tightening of the throat -recent heart attack or stroke -an unusual or allergic reaction to lisinopril, hydrochlorothiazide, sulfa drugs, other medicines, insect venom, foods, dyes, or preservatives -pregnant or trying to get pregnant -breast-feeding How should I use this medicine? Take this medicine by mouth with a glass of water. Follow the directions on the prescription label. You can take it with or without food. If it upsets your stomach, take it with food. Take your medicine at regular intervals. Do not take it more often than directed. Do not stop taking except on your doctor's advice. Talk to your pediatrician regarding the use of this medicine in children. Special care may be needed. Overdosage: If you think you have taken too much of this medicine contact a poison control center or emergency room at once. NOTE: This medicine is only for you. Do not share this medicine with others. What if I miss a dose? If you miss a dose, take it as soon as you can. If it is almost time for your next dose, take only that dose. Do not take double or extra doses. What may interact with  this medicine? -barbiturates like phenobarbital -blood pressure medicines -corticosteroids like prednisone -diabetic medications -diuretics, especially triamterene, spironolactone or amiloride -lithium -NSAIDs like ibuprofen -potassium salts or potassium supplements -prescription pain medicines -skeletal muscle relaxants like tubocurarine -some cholesterol lowering medications like cholestyramine or colestipol This list may not describe all possible interactions. Give your health care provider a list of all the medicines, herbs, non-prescription drugs, or dietary supplements you use. Also tell them if you smoke, drink alcohol, or use illegal drugs. Some items may interact with your medicine. What should I watch for while using this medicine? Visit your doctor or health care professional for regular checks on your progress. Check your blood pressure as directed. Ask your doctor or health care professional what your blood pressure should be and when you should contact him or her. Call your doctor or health care professional if you notice an irregular or fast heart beat. You must not get dehydrated. Ask your doctor or health care professional how much fluid you need to drink a day. Check with him or her if you get an attack of severe diarrhea, nausea and vomiting, or if you sweat a lot. The loss of too much body fluid can make it dangerous for you to take this medicine. Women should inform their doctor if they wish to become pregnant or think they might be pregnant. There is a potential for serious side effects to an unborn child. Talk to your health care professional or pharmacist for more information. You may get drowsy or dizzy. Do not drive, use machinery, or do anything that needs mental alertness until   you know how this drug affects you. Do not stand or sit up quickly, especially if you are an older patient. This reduces the risk of dizzy or fainting spells. Alcohol can make you more drowsy and  dizzy. Avoid alcoholic drinks. This medicine may affect your blood sugar level. If you have diabetes, check with your doctor or health care professional before changing the dose of your diabetic medicine. Avoid salt substitutes unless you are told otherwise by your doctor or health care professional. This medicine can make you more sensitive to the sun. Keep out of the sun. If you cannot avoid being in the sun, wear protective clothing and use sunscreen. Do not use sun lamps or tanning beds/booths. Do not treat yourself for coughs, colds, or pain while you are taking this medicine without asking your doctor or health care professional for advice. Some ingredients may increase your blood pressure. What side effects may I notice from receiving this medicine? Side effects that you should report to your doctor or health care professional as soon as possible: -changes in vision -confusion, dizziness, light headedness or fainting spells -decreased amount of urine passed -difficulty breathing or swallowing, hoarseness, or tightening of the throat -eye pain -fast or irregular heart beat, palpitations, or chest pain -muscle cramps -nausea and vomiting -persistent dry cough -redness, blistering, peeling or loosening of the skin, including inside the mouth -stomach pain -swelling of your face, lips, tongue, hands, or feet -unusual rash, bleeding or bruising, or pinpoint red spots on the skin -worsened gout pain -yellowing of the eyes or skin Side effects that usually do not require medical attention (report to your doctor or health care professional if they continue or are bothersome): -change in sex drive or performance -cough -headache This list may not describe all possible side effects. Call your doctor for medical advice about side effects. You may report side effects to FDA at 1-800-FDA-1088. Where should I keep my medicine? Keep out of the reach of children. Store at room temperature between  20 and 25 degrees C (68 and 77 degrees F). Protect from moisture and excessive light. Keep container tightly closed. Throw away any unused medicine after the expiration date. NOTE: This sheet is a summary. It may not cover all possible information. If you have questions about this medicine, talk to your doctor, pharmacist, or health care provider.  2015, Elsevier/Gold Standard. (2010-05-22 13:33:52)  

## 2014-05-17 ENCOUNTER — Encounter: Payer: Self-pay | Admitting: Internal Medicine

## 2014-05-17 ENCOUNTER — Ambulatory Visit: Payer: Self-pay | Attending: Internal Medicine | Admitting: Internal Medicine

## 2014-05-17 VITALS — BP 159/110 | HR 89 | Temp 98.6°F | Resp 16 | Ht 62.0 in | Wt 137.0 lb

## 2014-05-17 DIAGNOSIS — G8929 Other chronic pain: Secondary | ICD-10-CM | POA: Insufficient documentation

## 2014-05-17 DIAGNOSIS — I82509 Chronic embolism and thrombosis of unspecified deep veins of unspecified lower extremity: Secondary | ICD-10-CM | POA: Insufficient documentation

## 2014-05-17 DIAGNOSIS — M545 Low back pain, unspecified: Secondary | ICD-10-CM

## 2014-05-17 DIAGNOSIS — M25559 Pain in unspecified hip: Secondary | ICD-10-CM | POA: Insufficient documentation

## 2014-05-17 DIAGNOSIS — I10 Essential (primary) hypertension: Secondary | ICD-10-CM

## 2014-05-17 DIAGNOSIS — Z8611 Personal history of tuberculosis: Secondary | ICD-10-CM | POA: Insufficient documentation

## 2014-05-17 DIAGNOSIS — H6120 Impacted cerumen, unspecified ear: Secondary | ICD-10-CM

## 2014-05-17 DIAGNOSIS — H6121 Impacted cerumen, right ear: Secondary | ICD-10-CM

## 2014-05-17 MED ORDER — TRAMADOL HCL 50 MG PO TABS: 50.0000 mg | ORAL_TABLET | Freq: Two times a day (BID) | ORAL | Status: DC | PRN

## 2014-05-17 MED ORDER — LISINOPRIL-HYDROCHLOROTHIAZIDE 10-12.5 MG PO TABS: 1.0000 | ORAL_TABLET | Freq: Every day | ORAL | Status: DC

## 2014-05-17 NOTE — Progress Notes (Signed)
Patient ID: Ashley Jacobs, female   DOB: Nov 09, 1955, 58 y.o.   MRN: [id number]  CC:  Back pain  HPI:  Patient presents today with c/o of continued lower back pain.  She states that the pain medication helped some but not enough and she has returned for evaluation.  She has been on ibuprofen 800 mg.   Having nausea with no vomiting intermittently for past 4 days.  Related to the dizziness, but been dizzy for past year.  Been having problems out of both ears with ringing in ears. Has been out of meclizine for over one month.     No Known Allergies Past Medical History  Diagnosis Date  . Hypertension   . Chronic pain     right hip  . Tuberculosis ~ 1980    "took medicine for it 6 months"  . Leg DVT (deep venous thromboembolism), chronic 2009; 06/02/2012    "always left"  . Anginal pain 05/30/2012  . Shortness of breath 05/30/2012   Current Outpatient Prescriptions on File Prior to Visit  Medication Sig Dispense Refill  . diclofenac (VOLTAREN) 50 MG EC tablet Take 1 tablet (50 mg total) by mouth 2 (two) times daily as needed.  60 tablet  0  . lisinopril-hydrochlorothiazide (PRINZIDE) 10-12.5 MG per tablet Take 1 tablet by mouth daily.  30 tablet  3  . Acetaminophen (TYLENOL PO) Take 2 tablets by mouth as needed (pain).      . cyclobenzaprine (FLEXERIL) 10 MG tablet TAKE 1 TABLET BY MOUTH 3 TIMES DAILY AS NEEDED FOR MUSCLE SPASMS.  30 tablet  1  . diclofenac (VOLTAREN) 75 MG EC tablet Take 1 tablet (75 mg total) by mouth 2 (two) times daily.  30 tablet  2  . ibuprofen (ADVIL,MOTRIN) 800 MG tablet Take 1 tablet (800 mg total) by mouth every 8 (eight) hours as needed for pain.  30 tablet  0  . lisinopril-hydrochlorothiazide (ZESTORETIC) 10-12.5 MG per tablet Take 1 tablet by mouth daily.  30 tablet  1  . meclizine (ANTIVERT) 25 MG tablet Take 1 tablet (25 mg total) by mouth 2 (two) times daily.  30 tablet  0  . meloxicam (MOBIC) 7.5 MG tablet Take 1 tablet (7.5 mg total) by mouth daily.  30  tablet  0   No current facility-administered medications on file prior to visit.   History reviewed. No pertinent family history. History   Social History  . Marital Status: Married    Spouse Name: N/A    Number of Children: N/A  . Years of Education: N/A   Occupational History  . Not on file.   Social History Main Topics  . Smoking status: Never Smoker   . Smokeless tobacco: Never Used  . Alcohol Use: No  . Drug Use: No  . Sexual Activity: No   Other Topics Concern  . Not on file   Social History Narrative  . No narrative on file    Review of Systems  HENT: Positive for tinnitus. Negative for congestion, ear discharge, ear pain and sore throat.   Musculoskeletal: Positive for back pain and joint pain.  Neurological: Positive for dizziness. Negative for headaches.      Objective:   Filed Vitals:   05/17/14 1551  BP: 137/94  Pulse: 89  Temp: 98.6 F (37 C)  Resp: 16    Physical Exam: Constitutional: Patient appears well-developed and well-nourished. No distress. HENT: Normocephalic, atraumatic, External right and left ear normal. Oropharynx is clear and moist.  Eyes: Conjunctivae and EOM are normal. PERRLA, no scleral icterus. Neck: Normal ROM. Neck supple. No JVD. No tracheal deviation. No thyromegaly. CVS: RRR, S1/S2 +, no murmurs, no gallops, no carotid bruit.  Pulmonary: Effort and breath sounds normal, no stridor, rhonchi, wheezes, rales.  Abdominal: Soft. BS +,  no distension, tenderness, rebound or guarding.  Musculoskeletal: Normal range of motion. No edema.  Lumbar spine tenderness and left paraspinal tenderness Lymphadenopathy: No lymphadenopathy noted, cervical Skin: Skin is warm and dry. No rash noted. Not diaphoretic. No erythema. No pallor. Psychiatric: Normal mood and affect. Behavior, judgment, thought content normal.  Lab Results  Component Value Date   WBC 5.6 06/08/2013   HGB 15.0 01/06/2014   HCT 44.0 01/06/2014   MCV 87.6 06/08/2013    PLT 220 06/08/2013   Lab Results  Component Value Date   CREATININE 0.80 01/06/2014   BUN 10 01/06/2014   NA 141 01/06/2014   K 3.8 01/06/2014   CL 100 01/06/2014   CO2 25 06/08/2013    No results found for this basename: HGBA1C   Lipid Panel     Component Value Date/Time   CHOL 175 04/03/2010 2145   TRIG 107 04/03/2010 2145   HDL 56 04/03/2010 2145   CHOLHDL 3.1 Ratio 04/03/2010 2145   VLDL 21 04/03/2010 2145   LDLCALC 98 04/03/2010 2145       Assessment and plan:   Ashley Jacobs was seen today for follow-up.  Diagnoses and associated orders for this visit:  Essential hypertension - Refilled lisinopril-hydrochlorothiazide (PRINZIDE) 10-12.5 MG per tablet; Take 1 tablet by mouth daily.  Midline low back pain without sciatica - traMADol (ULTRAM) 50 MG tablet; Take 1 tablet (50 mg total) by mouth every 12 (twelve) hours as needed.  Cerumen impaction, right - Ear Lavage-unable to remove wax from ear. Encouraged patient to get OTC debrox and put in ears over teh weekend and return next week for a repeat lavage  Return in about 3 months (around 08/16/2014) for Hypertension.     Holland Commons, NP-C [facility] [phone] 05/22/2014, 2:40 PM

## 2014-05-17 NOTE — Progress Notes (Signed)
Pt is here today following up on her lower back pain.

## 2014-07-03 ENCOUNTER — Emergency Department (HOSPITAL_COMMUNITY)
Admission: EM | Admit: 2014-07-03 | Discharge: 2014-07-03 | Disposition: A | Discharge status: Home / Self Care | Payer: Self-pay | Attending: Emergency Medicine | Admitting: Emergency Medicine

## 2014-07-03 ENCOUNTER — Other Ambulatory Visit: Payer: Self-pay

## 2014-07-03 ENCOUNTER — Ambulatory Visit: Payer: Self-pay | Attending: Internal Medicine | Admitting: Internal Medicine

## 2014-07-03 ENCOUNTER — Ambulatory Visit (HOSPITAL_COMMUNITY)
Admission: RE | Admit: 2014-07-03 | Discharge: 2014-07-03 | Disposition: A | Discharge status: Home / Self Care | Payer: Self-pay | Source: Ambulatory Visit | Attending: Cardiology | Admitting: Cardiology

## 2014-07-03 ENCOUNTER — Encounter: Payer: Self-pay | Admitting: Internal Medicine

## 2014-07-03 ENCOUNTER — Emergency Department (HOSPITAL_COMMUNITY): Payer: Self-pay

## 2014-07-03 ENCOUNTER — Encounter (HOSPITAL_COMMUNITY): Payer: Self-pay | Admitting: Emergency Medicine

## 2014-07-03 VITALS — BP 80/60 | HR 107 | Temp 97.8°F | Resp 16 | Ht 62.0 in | Wt 132.0 lb

## 2014-07-03 DIAGNOSIS — Z86718 Personal history of other venous thrombosis and embolism: Secondary | ICD-10-CM | POA: Insufficient documentation

## 2014-07-03 DIAGNOSIS — R0602 Shortness of breath: Secondary | ICD-10-CM | POA: Insufficient documentation

## 2014-07-03 DIAGNOSIS — R6883 Chills (without fever): Secondary | ICD-10-CM | POA: Insufficient documentation

## 2014-07-03 DIAGNOSIS — G8929 Other chronic pain: Secondary | ICD-10-CM | POA: Insufficient documentation

## 2014-07-03 DIAGNOSIS — I10 Essential (primary) hypertension: Secondary | ICD-10-CM | POA: Insufficient documentation

## 2014-07-03 DIAGNOSIS — I209 Angina pectoris, unspecified: Secondary | ICD-10-CM | POA: Insufficient documentation

## 2014-07-03 DIAGNOSIS — M79602 Pain in left arm: Secondary | ICD-10-CM | POA: Insufficient documentation

## 2014-07-03 DIAGNOSIS — R05 Cough: Secondary | ICD-10-CM | POA: Insufficient documentation

## 2014-07-03 DIAGNOSIS — M25551 Pain in right hip: Secondary | ICD-10-CM | POA: Insufficient documentation

## 2014-07-03 DIAGNOSIS — R000 Tachycardia, unspecified: Secondary | ICD-10-CM | POA: Insufficient documentation

## 2014-07-03 DIAGNOSIS — R42 Dizziness and giddiness: Secondary | ICD-10-CM | POA: Insufficient documentation

## 2014-07-03 DIAGNOSIS — R079 Chest pain, unspecified: Secondary | ICD-10-CM | POA: Insufficient documentation

## 2014-07-03 DIAGNOSIS — R51 Headache: Secondary | ICD-10-CM | POA: Insufficient documentation

## 2014-07-03 DIAGNOSIS — Z79899 Other long term (current) drug therapy: Secondary | ICD-10-CM | POA: Insufficient documentation

## 2014-07-03 DIAGNOSIS — R0789 Other chest pain: Secondary | ICD-10-CM | POA: Insufficient documentation

## 2014-07-03 DIAGNOSIS — Z8611 Personal history of tuberculosis: Secondary | ICD-10-CM | POA: Insufficient documentation

## 2014-07-03 LAB — BASIC METABOLIC PANEL
Anion gap: 11 (ref 5–15)
BUN: 20 mg/dL (ref 6–23)
CO2: 29 mEq/L (ref 19–32)
Calcium: 9.6 mg/dL (ref 8.4–10.5)
Chloride: 99 mEq/L (ref 96–112)
Creatinine, Ser: 0.85 mg/dL (ref 0.50–1.10)
GFR calc Af Amer: 88 mL/min — ABNORMAL LOW (ref >90)
GFR calc non Af Amer: 76 mL/min — ABNORMAL LOW (ref >90)
Glucose, Bld: 103 mg/dL — ABNORMAL HIGH (ref 70–99)
Potassium: 3.9 mEq/L (ref 3.7–5.3)
Sodium: 139 mEq/L (ref 137–147)

## 2014-07-03 LAB — CBC
HCT: 39.1 % (ref 36.0–46.0)
Hemoglobin: 13.4 g/dL (ref 12.0–15.0)
MCH: 30.2 pg (ref 26.0–34.0)
MCHC: 34.3 g/dL (ref 30.0–36.0)
MCV: 88.1 fL (ref 78.0–100.0)
Platelets: 272 10*3/uL (ref 150–400)
RBC: 4.44 MIL/uL (ref 3.87–5.11)
RDW: 13.1 % (ref 11.5–15.5)
WBC: 7.9 10*3/uL (ref 4.0–10.5)

## 2014-07-03 LAB — I-STAT TROPONIN, ED: Troponin i, poc: 0.01 ng/mL (ref 0.00–0.08)

## 2014-07-03 MED ORDER — ACETAMINOPHEN 500 MG PO TABS
1000.0000 mg | ORAL_TABLET | Freq: Once | ORAL | Status: AC
  Administered 2014-07-03: 1000 mg via ORAL
  Filled 2014-07-03: qty 2

## 2014-07-03 MED ORDER — SODIUM CHLORIDE 0.9 % IV BOLUS (SEPSIS)
1000.0000 mL | Freq: Once | INTRAVENOUS | Status: AC
  Administered 2014-07-03: 1000 mL via INTRAVENOUS

## 2014-07-03 NOTE — Progress Notes (Signed)
Patient ID: Ashley Jacobs, female   DOB: [birth year], 58 y.o.   MRN: [id number]  CC: chest pain  HPI:  Patient presents to clinic today with concerns of chest pain that began this morning.  She states that the chest pain is sharp and it radiates to her left arm.  She states that her left arm is achy.  She reports that she believes her chest pain is related to a cold.  She admits fever, drowsiness, light headache, rhinitis, and SOB.  She denies recent travel.  Her BP is low on presentation today.  Patient heart rate went up to 112 upon standing on exam and back down to 102 upon sitting.  She states that the chest pain is aggravated by deep inspiration.  Patient will be transferred to the ED for further workup with troponin's and chest xray.    No Known Allergies Past Medical History  Diagnosis Date  . Hypertension   . Chronic pain     right hip  . Tuberculosis ~ 1980    "took medicine for it 6 months"  . Leg DVT (deep venous thromboembolism), chronic 2009; 06/02/2012    "always left"  . Anginal pain 05/30/2012  . Shortness of breath 05/30/2012   Current Outpatient Prescriptions on File Prior to Visit  Medication Sig Dispense Refill  . lisinopril-hydrochlorothiazide (ZESTORETIC) 10-12.5 MG per tablet Take 1 tablet by mouth daily.  30 tablet  1  . traMADol (ULTRAM) 50 MG tablet Take 1 tablet (50 mg total) by mouth every 12 (twelve) hours as needed.  60 tablet  1  . Acetaminophen (TYLENOL PO) Take 2 tablets by mouth as needed (pain).      . cyclobenzaprine (FLEXERIL) 10 MG tablet TAKE 1 TABLET BY MOUTH 3 TIMES DAILY AS NEEDED FOR MUSCLE SPASMS.  30 tablet  1  . ibuprofen (ADVIL,MOTRIN) 800 MG tablet Take 1 tablet (800 mg total) by mouth every 8 (eight) hours as needed for pain.  30 tablet  0  . lisinopril-hydrochlorothiazide (PRINZIDE) 10-12.5 MG per tablet Take 1 tablet by mouth daily.  30 tablet  3  . meclizine (ANTIVERT) 25 MG tablet Take 1 tablet (25 mg total) by mouth 2 (two) times daily.   30 tablet  0   No current facility-administered medications on file prior to visit.   History reviewed. No pertinent family history. History   Social History  . Marital Status: Married    Spouse Name: N/A    Number of Children: N/A  . Years of Education: N/A   Occupational History  . Not on file.   Social History Main Topics  . Smoking status: Never Smoker   . Smokeless tobacco: Never Used  . Alcohol Use: No  . Drug Use: No  . Sexual Activity: No   Other Topics Concern  . Not on file   Social History Narrative  . No narrative on file   Review of Systems  Constitutional: Positive for fever and malaise/fatigue.  Respiratory: Positive for shortness of breath. Negative for cough, sputum production and wheezing.   Cardiovascular: Positive for chest pain.  Musculoskeletal: Positive for back pain.  Neurological: Positive for weakness and headaches.       Objective:   Filed Vitals:   07/03/14 1636  BP: 80/60  Pulse:   Temp:   Resp:    Physical Exam  Constitutional:  Does not appear to be distressed.   Cardiovascular: Normal rate.   Tachycardic   Pulmonary/Chest: Effort normal and breath sounds  normal.  Abdominal: Soft. Bowel sounds are normal.  Neurological: She is alert.  Skin: She is not diaphoretic.     Lab Results  Component Value Date   WBC 5.6 06/08/2013   HGB 15.0 01/06/2014   HCT 44.0 01/06/2014   MCV 87.6 06/08/2013   PLT 220 06/08/2013   Lab Results  Component Value Date   CREATININE 0.80 01/06/2014   BUN 10 01/06/2014   NA 141 01/06/2014   K 3.8 01/06/2014   CL 100 01/06/2014   CO2 25 06/08/2013    No results found for this basename: HGBA1C   Lipid Panel     Component Value Date/Time   CHOL 175 04/03/2010 2145   TRIG 107 04/03/2010 2145   HDL 56 04/03/2010 2145   CHOLHDL 3.1 Ratio 04/03/2010 2145   VLDL 21 04/03/2010 2145   LDLCALC 98 04/03/2010 2145       Assessment and plan:   There are no diagnoses linked to this encounter.        Holland CommonsKECK, Evens Meno, NP-C [facility] [phone] 07/03/2014, 4:56 PM

## 2014-07-03 NOTE — Discharge Instructions (Signed)

## 2014-07-03 NOTE — Patient Instructions (Signed)
[facility] being [facility] via CMA assistance Report given to charge nurse Melissa Pt in stable condition accompanied with Nepali interpretor

## 2014-07-03 NOTE — Progress Notes (Signed)
Pt is here today c/o lower back pain, left shoulder and arm pain, headaches and chest pain when she takes a deep breath that started last night.

## 2014-07-03 NOTE — ED Notes (Signed)
Pt in c/o chest pain since this am, went to wellness center and sent here for further evaluation, hypotensive at office, pt c/o increased shortness of breath today, no distress noted

## 2014-07-03 NOTE — ED Provider Notes (Signed)
CSN: [id number]     Arrival date & time 07/03/14  1732 History   First MD Initiated Contact with Patient 07/03/14 1859     Chief Complaint  Patient presents with  . Chest Pain     (Consider location/radiation/quality/duration/timing/severity/associated sxs/prior Treatment) HPI Ashley Jacobs is a 58 year old female with past medical history of hypertension, DVT, angina, tuberculosis in 1980 this the ER complaining of 3 days of lightheadedness, with generalized weakness, and one day of chest pain. Patient reports her lightheadedness and generalized weakness began gradually 3 days ago, and has not improved since. Patient reports having several episodes where she will stand up and become very lightheaded, and "see spots". Patient states the symptoms are relieved by sitting back down resting. Patient states the past day, she's been experiencing a midsternal chest pain which he describes as sharp pain. Patient states the pain is aggravated with coughing, movement of her body, alleviated with rest. Patient's states she did not notice the pain which is walking, only when she is lifting anything, moving her arms, or coughing. Patient denies having a productive cough. Patient reports feeling like she's been running a fever, however has not checked her temperature at home. Patient denies any nausea, vomiting, abdominal pain, syncope, diarrhea, dysuria.  Past Medical History  Diagnosis Date  . Hypertension   . Chronic pain     right hip  . Tuberculosis ~ 1980    "took medicine for it 6 months"  . Leg DVT (deep venous thromboembolism), chronic 2009; 06/02/2012    "always left"  . Anginal pain 05/30/2012  . Shortness of breath 05/30/2012   Past Surgical History  Procedure Laterality Date  . Vena cava filter placement  06/02/12   History reviewed. No pertinent family history. History  Substance Use Topics  . Smoking status: Never Smoker   . Smokeless tobacco: Never Used  . Alcohol Use: No   OB  History   Grav Para Term Preterm Abortions TAB SAB Ect Mult Living                 Review of Systems  Constitutional: Positive for chills. Negative for fever.  HENT: Negative for trouble swallowing.   Eyes: Negative for visual disturbance.  Respiratory: Positive for shortness of breath.   Cardiovascular: Positive for chest pain.  Gastrointestinal: Negative for nausea, vomiting and abdominal pain.  Genitourinary: Negative for dysuria.  Musculoskeletal: Negative for neck pain.  Skin: Negative for rash.  Neurological: Positive for light-headedness. Negative for dizziness, weakness and numbness.  Psychiatric/Behavioral: Negative.       Allergies  Review of patient's allergies indicates no known allergies.  Home Medications   Prior to Admission medications   Medication Sig Start Date End Date Taking? Authorizing Provider  lisinopril-hydrochlorothiazide (ZESTORETIC) 10-12.5 MG per tablet Take 1 tablet by mouth daily. 01/06/14  Yes Rodolph BongEvan S Corey, MD  traMADol (ULTRAM) 50 MG tablet Take 50 mg by mouth every 12 (twelve) hours as needed for moderate pain. 05/17/14  Yes Ambrose FinlandValerie A Keck, NP   BP 100/68  Pulse 80  Temp(Src) 98 F (36.7 C) (Oral)  Resp 16  SpO2 100% Physical Exam  Nursing note and vitals reviewed. Constitutional: She is oriented to person, place, and time. She appears well-developed and well-nourished. No distress.  HENT:  Head: Normocephalic and atraumatic.  Mouth/Throat: Oropharynx is clear and moist. No oropharyngeal exudate.  Eyes: Right eye exhibits no discharge. Left eye exhibits no discharge. No scleral icterus.  Neck: Normal range of motion.  Cardiovascular: Normal rate, regular rhythm and normal heart sounds.   No murmur heard. Pulmonary/Chest: Effort normal and breath sounds normal. No accessory muscle usage. Not tachypneic. No respiratory distress.    Abdominal: Soft. Normal appearance and bowel sounds are normal. There is no tenderness.  Musculoskeletal:  Normal range of motion. She exhibits no edema and no tenderness.  Neurological: She is alert and oriented to person, place, and time. No cranial nerve deficit. Coordination normal.  Skin: Skin is warm and dry. No rash noted. She is not diaphoretic.  Psychiatric: She has a normal mood and affect.    ED Course  Procedures (including critical care time) Labs Review Labs Reviewed  BASIC METABOLIC PANEL - Abnormal; Notable for the following:    Glucose, Bld 103 (*)    GFR calc non Af Amer 76 (*)    GFR calc Af Amer 88 (*)    All other components within normal limits  CBC  I-STAT TROPOININ, ED    Imaging Review Dg Chest 2 View  07/03/2014   CLINICAL DATA:  Initial encounter for Chest pain since early this morning.  EXAM: CHEST  2 VIEW  COMPARISON:  9/24/ 14  FINDINGS: Volume loss with right apical scarring is stable. The scarring in the left upper lobe is also stable. No pulmonary edema. No new airspace consolidation. No pleural effusion. The cardio pericardial silhouette is enlarged. Bones are diffusely demineralized. IVC filter again noted in-situ.  IMPRESSION: Stable exam. Bilateral upper lobe pleural parenchymal scarring. No new or progressive findings.   Electronically Signed   By: Kennith CenterEric  Mansell M.D.   On: 07/03/2014 19:09     EKG Interpretation None      MDM   Final diagnoses:  Chest discomfort  Light headedness    57 yof with pmhx of chronic DVT with IVC filter placement 05/2012 presenting with 3 days of generalized malaise and lightheadedness when standing. Workup to include consideration of ACS, PNA, PE, musculoskeletal pain, dehydration.  Orthostatic vital signs positive, will start patient on IV fluid bolus.  10:45 PM: Patient asymptomatic at this time, and able to ambulate in the room without any lightheadedness. Patient stating she is ready to home. We'll discharge at this time, and have patient follow care physician. Patient was agreeable to this plan. Patient is to  be discharged with recommendation to follow up with PCP in regards to today's hospital visit. Chest pain is not likely of cardiac or pulmonary etiology d/t presentation. PE considered with patient's initial tachycardia, complaining of shortness of breath, and history of chronic DVT however during stay in the ED she has been non-hypoxic, nontachypneic non-tachycardic. Also of note patient's chronic DVT is managed with a IVC filter, which all of these factors along with the fact the patient is not clinically exhibiting signs of a PE, I believe this is not likely at this time. VSS, no tracheal deviation, no JVD or new murmur, RRR, breath sounds equal bilaterally, EKG without acute abnormalities, negative troponin, and negative CXR. Pt has been advised return to the ED is CP becomes exertional, associated with diaphoresis or nausea, radiates to left jaw/arm, worsens or becomes concerning in any way. Pt appears reliable for follow up and is agreeable to discharge.   BP 100/68  Pulse 80  Temp(Src) 98 F (36.7 C) (Oral)  Resp 16  SpO2 100%  Signed,  Ladona MowJoe Noah Lembke, PA-C 1:01 AM  This patient discussed with Dr. Raeford RazorStephen Kohut, M.D.      Monte FantasiaJoseph W Kaizlee Carlino, PA-C  07/04/14 0102 

## 2014-07-06 NOTE — ED Provider Notes (Signed)
Medical screening examination/treatment/procedure(s) were performed by non-physician practitioner and as supervising physician I was immediately available for consultation/collaboration.   EKG Interpretation   Date/Time:  Monday July 03 2014 17:45:24 EDT Ventricular Rate:  97 PR Interval:  150 QRS Duration: 92 QT Interval:  344 QTC Calculation: 436 R Axis:   66 Text Interpretation:  Normal sinus rhythm Normal ECG ED PHYSICIAN  INTERPRETATION AVAILABLE IN [facility] Confirmed by TEST, Record  ([id number]) on 07/05/2014 7:52:14 AM       Raeford RazorStephen Brigit Doke, MD 07/06/14 [phone]

## 2014-07-28 ENCOUNTER — Ambulatory Visit: Payer: Self-pay

## 2014-08-14 ENCOUNTER — Ambulatory Visit: Payer: Self-pay | Attending: Internal Medicine

## 2014-08-25 ENCOUNTER — Ambulatory Visit: Payer: Self-pay

## 2014-08-30 ENCOUNTER — Ambulatory Visit: Payer: Self-pay | Attending: Internal Medicine | Admitting: Internal Medicine

## 2014-08-30 ENCOUNTER — Encounter: Payer: Self-pay | Admitting: Internal Medicine

## 2014-08-30 VITALS — BP 115/83 | HR 95 | Temp 98.2°F | Resp 16 | Ht 62.0 in | Wt 133.0 lb

## 2014-08-30 DIAGNOSIS — G894 Chronic pain syndrome: Secondary | ICD-10-CM

## 2014-08-30 DIAGNOSIS — M25562 Pain in left knee: Secondary | ICD-10-CM | POA: Insufficient documentation

## 2014-08-30 DIAGNOSIS — M542 Cervicalgia: Secondary | ICD-10-CM | POA: Insufficient documentation

## 2014-08-30 DIAGNOSIS — M549 Dorsalgia, unspecified: Secondary | ICD-10-CM | POA: Insufficient documentation

## 2014-08-30 DIAGNOSIS — I10 Essential (primary) hypertension: Secondary | ICD-10-CM

## 2014-08-30 DIAGNOSIS — R51 Headache: Secondary | ICD-10-CM | POA: Insufficient documentation

## 2014-08-30 DIAGNOSIS — M25561 Pain in right knee: Secondary | ICD-10-CM | POA: Insufficient documentation

## 2014-08-30 MED ORDER — LISINOPRIL-HYDROCHLOROTHIAZIDE 10-12.5 MG PO TABS: 1.0000 | ORAL_TABLET | Freq: Every day | ORAL | Status: DC

## 2014-08-30 MED ORDER — TRAMADOL HCL 50 MG PO TABS: 50.0000 mg | ORAL_TABLET | Freq: Two times a day (BID) | ORAL | Status: DC | PRN

## 2014-08-30 MED ORDER — GABAPENTIN 300 MG PO CAPS: 300.0000 mg | ORAL_CAPSULE | Freq: Every day | ORAL | Status: DC

## 2014-08-30 NOTE — Progress Notes (Signed)
Patient ID: Ashley Jacobs, female   DOB: [birth year], 58 y.o.   MRN: [id number]  CC: follow up  HPI: Ashley Jacobs is a 58 y.o. female here today for a follow up visit.  Patient has past medical history of hypertension and chronic pain.  She has a long standing history of chronic pain in her neck, back, and knees and would like a refill of pain medication.  Patient also c/o of frequent headaches that have been going on for several months.  The headaches are usually present at least three times per week which causes her to have dizziness. The pain begins in her neck and radiates to her head.  She denies related nausea, vomiting, photophobia, or phonophobia.  She takes her blood pressure medication daily without skipped doses.  She is compliant with a low sodium diet. She does not exercise or check her pressures regularly.  She denies chest pain, SOB, edema, claudication, or palpitations.     No Known Allergies Past Medical History  Diagnosis Date  . Hypertension   . Chronic pain     right hip  . Tuberculosis ~ 1980    "took medicine for it 6 months"  . Leg DVT (deep venous thromboembolism), chronic 2009; 06/02/2012    "always left"  . Anginal pain 05/30/2012  . Shortness of breath 05/30/2012   Current Outpatient Prescriptions on File Prior to Visit  Medication Sig Dispense Refill  . lisinopril-hydrochlorothiazide (ZESTORETIC) 10-12.5 MG per tablet Take 1 tablet by mouth daily. 30 tablet 1  . traMADol (ULTRAM) 50 MG tablet Take 50 mg by mouth every 12 (twelve) hours as needed for moderate pain.     No current facility-administered medications on file prior to visit.   History reviewed. No pertinent family history. History   Social History  . Marital Status: Married    Spouse Name: N/A    Number of Children: N/A  . Years of Education: N/A   Occupational History  . Not on file.   Social History Main Topics  . Smoking status: Never Smoker   . Smokeless tobacco: Never Used  .  Alcohol Use: No  . Drug Use: No  . Sexual Activity: No   Other Topics Concern  . Not on file   Social History Narrative    Review of Systems: See HPI   Objective:   Filed Vitals:   08/30/14 1134  BP: 115/83  Pulse: 95  Temp: 98.2 F (36.8 C)  Resp: 16    Physical Exam  Constitutional: She is oriented to person, place, and time.  Neck: Normal range of motion.  Cardiovascular: Normal rate, regular rhythm and normal heart sounds.   Pulmonary/Chest: Effort normal and breath sounds normal.  Musculoskeletal: Normal range of motion. She exhibits tenderness (paraspinal tenderness bilaterally and neck). She exhibits no edema.  Neurological: She is alert and oriented to person, place, and time. She has normal reflexes.  Skin: Skin is warm and dry.  Psychiatric: She has a normal mood and affect.     Lab Results  Component Value Date   WBC 7.9 07/03/2014   HGB 13.4 07/03/2014   HCT 39.1 07/03/2014   MCV 88.1 07/03/2014   PLT 272 07/03/2014   Lab Results  Component Value Date   CREATININE 0.85 07/03/2014   BUN 20 07/03/2014   NA 139 07/03/2014   K 3.9 07/03/2014   CL 99 07/03/2014   CO2 29 07/03/2014    No results found for: HGBA1C Lipid Panel  Component Value Date/Time   CHOL 175 04/03/2010 2145   TRIG 107 04/03/2010 2145   HDL 56 04/03/2010 2145   CHOLHDL 3.1 Ratio 04/03/2010 2145   VLDL 21 04/03/2010 2145   LDLCALC 98 04/03/2010 2145       Assessment and plan:   [name] was seen today for follow-up.  Diagnoses and associated orders for this visit:  Essential hypertension - lisinopril-hydrochlorothiazide (ZESTORETIC) 10-12.5 MG per tablet; Take 1 tablet by mouth daily. Patient blood pressure is stable and may continue on current medication.  Education on diet, exercise, and modifiable risk factors discussed. Will obtain appropriate labs as needed. Will follow up in 3-6 months.   Chronic pain syndrome - Begin gabapentin (NEURONTIN) 300 MG capsule;  Take 1 capsule (300 mg total) by mouth at bedtime. Take half tablet nightly for one week, then switch to whole nightly.  Will help with headache prophylaxis as well as pain control.   - traMADol (ULTRAM) 50 MG tablet; Take 1 tablet (50 mg total) by mouth every 12 (twelve) hours as needed for moderate pain.  Due to language barrier, an interpreter was present during the history-taking and subsequent discussion (and for part of the physical exam) with this patient.  Return in about 3 months (around 11/29/2014) for Hypertension.       Holland CommonsKECK, Denaisha Swango, NP-C [facility] [phone] 08/30/2014, 12:01 PM

## 2014-08-30 NOTE — Progress Notes (Signed)
Pt is here today following up in her HTN and chronic pain in her neck, back, b/ knees. Pt is c/o a headache toady. Pt needs her medications refilled.

## 2014-11-03 ENCOUNTER — Other Ambulatory Visit: Payer: Self-pay | Admitting: Internal Medicine

## 2014-11-10 ENCOUNTER — Ambulatory Visit: Payer: Self-pay | Attending: Internal Medicine | Admitting: Internal Medicine

## 2014-11-10 ENCOUNTER — Encounter: Payer: Self-pay | Admitting: Internal Medicine

## 2014-11-10 VITALS — BP 90/65 | HR 96 | Temp 98.3°F | Resp 16 | Ht 62.0 in | Wt 136.0 lb

## 2014-11-10 DIAGNOSIS — M25561 Pain in right knee: Secondary | ICD-10-CM | POA: Insufficient documentation

## 2014-11-10 DIAGNOSIS — I959 Hypotension, unspecified: Secondary | ICD-10-CM | POA: Insufficient documentation

## 2014-11-10 DIAGNOSIS — M542 Cervicalgia: Secondary | ICD-10-CM | POA: Insufficient documentation

## 2014-11-10 DIAGNOSIS — M549 Dorsalgia, unspecified: Secondary | ICD-10-CM | POA: Insufficient documentation

## 2014-11-10 DIAGNOSIS — G8929 Other chronic pain: Secondary | ICD-10-CM

## 2014-11-10 DIAGNOSIS — I10 Essential (primary) hypertension: Secondary | ICD-10-CM | POA: Insufficient documentation

## 2014-11-10 DIAGNOSIS — G894 Chronic pain syndrome: Secondary | ICD-10-CM | POA: Insufficient documentation

## 2014-11-10 DIAGNOSIS — Z86718 Personal history of other venous thrombosis and embolism: Secondary | ICD-10-CM | POA: Insufficient documentation

## 2014-11-10 DIAGNOSIS — M25562 Pain in left knee: Secondary | ICD-10-CM | POA: Insufficient documentation

## 2014-11-10 MED ORDER — LISINOPRIL-HYDROCHLOROTHIAZIDE 10-12.5 MG PO TABS
1.0000 | ORAL_TABLET | Freq: Every day | ORAL | Status: DC
Start: 2014-11-10 — End: 2015-03-30

## 2014-11-10 MED ORDER — TRAMADOL HCL 50 MG PO TABS: 50.0000 mg | ORAL_TABLET | Freq: Two times a day (BID) | ORAL | Status: DC | PRN

## 2014-11-10 MED ORDER — GABAPENTIN 300 MG PO CAPS: 300.0000 mg | ORAL_CAPSULE | Freq: Every day | ORAL | Status: DC

## 2014-11-10 NOTE — Progress Notes (Signed)
Pt is here following up on her HTN and her chronic pain in her neck and lower back.

## 2014-11-10 NOTE — Progress Notes (Signed)
Patient ID: Ashley Jacobs, female   DOB: [birth year], 59 y.o.   MRN: [id number]  CC: chronic neck, back pain  HPI: Ashley Jacobs is a 59 y.o. female here today for a follow up visit. Patient has past medical history of hypertension and chronic pain. She has a long standing history of chronic pain in her neck, back, and knees and would like a refill of pain medication. Patient also c/o of frequent headaches that have been going on for several months. The headaches are usually present at least three times per week which causes her to have dizziness. The pain begins in her neck and radiates to her head. She denies related nausea, vomiting, photophobia, or phonophobia.  She takes her blood pressure medication daily without skipped doses. She is compliant with a low sodium diet. She does not exercise or check her pressures regularly. She denies chest pain, SOB, edema, claudication, or palpitations. She states that she did not take her BP medication today because she felt dizzy.  No Known Allergies Past Medical History  Diagnosis Date  . Hypertension   . Chronic pain     right hip  . Tuberculosis ~ 1980    "took medicine for it 6 months"  . Leg DVT (deep venous thromboembolism), chronic 2009; 06/02/2012    "always left"  . Anginal pain 05/30/2012  . Shortness of breath 05/30/2012   Current Outpatient Prescriptions on File Prior to Visit  Medication Sig Dispense Refill  . gabapentin (NEURONTIN) 300 MG capsule Take 1 capsule (300 mg total) by mouth at bedtime. Take half tablet nightly for one week, then switch to whole nightly 30 capsule 2  . lisinopril-hydrochlorothiazide (ZESTORETIC) 10-12.5 MG per tablet Take 1 tablet by mouth daily. 30 tablet 4  . traMADol (ULTRAM) 50 MG tablet Take 1 tablet (50 mg total) by mouth every 12 (twelve) hours as needed for moderate pain. 60 tablet 1   No current facility-administered medications on file prior to visit.   History reviewed. No pertinent family  history. History   Social History  . Marital Status: Married    Spouse Name: N/A  . Number of Children: N/A  . Years of Education: N/A   Occupational History  . Not on file.   Social History Main Topics  . Smoking status: Never Smoker   . Smokeless tobacco: Never Used  . Alcohol Use: No  . Drug Use: No  . Sexual Activity: No   Other Topics Concern  . Not on file   Social History Narrative    Review of Systems: See HPI   Objective:   Filed Vitals:   11/10/14 1130  BP: 90/65  Pulse: 96  Temp: 98.3 F (36.8 C)  Resp: 16    Physical Exam  Constitutional: She is oriented to person, place, and time.  Neck: Normal range of motion.  Cardiovascular: Normal rate, regular rhythm and normal heart sounds.  Pulmonary/Chest: Effort normal and breath sounds normal.  Musculoskeletal: Normal range of motion. She exhibits tenderness (paraspinal tenderness bilaterally and neck). She exhibits no edema.  Neurological: She is alert and oriented to person, place, and time. She has normal reflexes.  Skin: Skin is warm and dry.  Psychiatric: She has a normal mood and affect  Lab Results  Component Value Date   WBC 7.9 07/03/2014   HGB 13.4 07/03/2014   HCT 39.1 07/03/2014   MCV 88.1 07/03/2014   PLT 272 07/03/2014   Lab Results  Component Value Date   CREATININE  0.85 07/03/2014   BUN 20 07/03/2014   NA 139 07/03/2014   K 3.9 07/03/2014   CL 99 07/03/2014   CO2 29 07/03/2014    No results found for: HGBA1C Lipid Panel     Component Value Date/Time   CHOL 175 04/03/2010 2145   TRIG 107 04/03/2010 2145   HDL 56 04/03/2010 2145   CHOLHDL 3.1 Ratio 04/03/2010 2145   VLDL 21 04/03/2010 2145   LDLCALC 98 04/03/2010 2145       Assessment and plan:   Ashley Jacobs was seen today for follow-up.  Diagnoses and all orders for this visit:  Chronic pain syndrome Orders: -     Refill gabapentin (NEURONTIN) 300 MG capsule; Take 1 capsule (300 mg total) by mouth at bedtime.  Take half tablet nightly for one week, then switch to whole nightly -    Refill traMADol (ULTRAM) 50 MG tablet; Take 1 tablet (50 mg total) by mouth every 12 (twelve) hours as needed for moderate pain.  Chronic neck pain Orders: -     Ambulatory referral to Orthopedic Surgery  Hypotension, unspecified hypotension type Advised patient to stop BP medication for a few days and to come back for a recheck before restarting. Her pressure was low today. Explained to patient that BP medication may be causing dizziness  Due to language barrier, an interpreter was present during the history-taking and subsequent discussion (and for part of the physical exam) with this patient.  Return for Mon-Tuesday-BP check .      Holland Commons, NP-C [facility] [phone] 11/10/2014, 12:11 PM

## 2014-11-13 ENCOUNTER — Encounter: Payer: Self-pay | Admitting: Internal Medicine

## 2014-11-14 ENCOUNTER — Encounter: Payer: Self-pay | Admitting: Internal Medicine

## 2014-11-15 ENCOUNTER — Ambulatory Visit: Payer: Self-pay | Attending: Internal Medicine | Admitting: Internal Medicine

## 2014-11-15 ENCOUNTER — Encounter: Payer: Self-pay | Admitting: Internal Medicine

## 2014-11-15 VITALS — BP 139/98 | HR 99 | Temp 97.8°F | Resp 16

## 2014-11-15 DIAGNOSIS — I10 Essential (primary) hypertension: Secondary | ICD-10-CM

## 2014-11-15 NOTE — Progress Notes (Signed)
Patient here for blood pressure check per Holland CommonsValerie Jacobs Patient's blood pressure is elevated at todays visit Per Holland CommonsValerie keck NP patient is to start back taking her lisinopril HCTZ We will bring her back in two weeks for nurse visit to check her blood pressure

## 2014-12-07 ENCOUNTER — Ambulatory Visit: Payer: Self-pay | Attending: Internal Medicine

## 2014-12-07 VITALS — BP 127/87 | HR 75 | Temp 98.0°F | Resp 16

## 2014-12-07 DIAGNOSIS — G894 Chronic pain syndrome: Secondary | ICD-10-CM

## 2014-12-07 MED ORDER — GABAPENTIN 300 MG PO CAPS: 300.0000 mg | ORAL_CAPSULE | Freq: Every day | ORAL | Status: DC

## 2014-12-07 NOTE — Patient Instructions (Signed)
Follow up with your Doctor in 3 months call clinic to schedule you next appointment on May 20th 2016.

## 2014-12-07 NOTE — Progress Notes (Unsigned)
Pt is here today for a BP check because patient was advised to stop BP medication for a few days and to come back for a recheck before restarting due to a low BP reading.

## 2014-12-16 IMAGING — CT CT ANGIO CHEST
2 of 8 series · 18 of 46 positions shown · IV contrast (omnipaque)
Comparison: Plain films of the chest 06/08/2013 and 05/28/2012. CT
chest 05/30/2012.

CLINICAL DATA: Difficulty breathing. Decreased lung sounds on the
right. Chest pain.

EXAM:
CT ANGIOGRAPHY CHEST WITH CONTRAST
TECHNIQUE: Multidetector CT imaging of the chest was performed using the
standard protocol during bolus administration of intravenous
contrast. Multiplanar CT image reconstructions including MIPs were
obtained to evaluate the vascular anatomy.
CONTRAST:  100mL OMNIPAQUE IOHEXOL 350 MG/ML SOLN

[Series 5: thins · axial · 0.73mm/px · z∈[-295,-96]mm · 15 of 219 slices shown]
[im 10/219  lung]
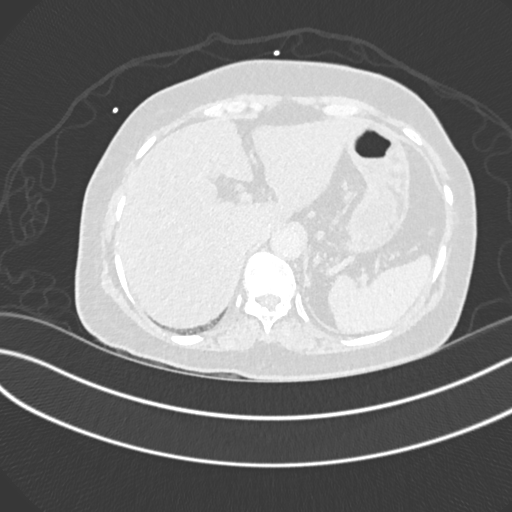
[im 30/219  soft-tissue]
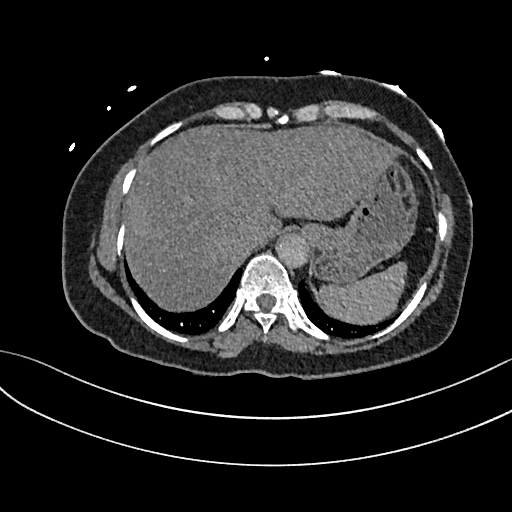
[im 40/219  lung]
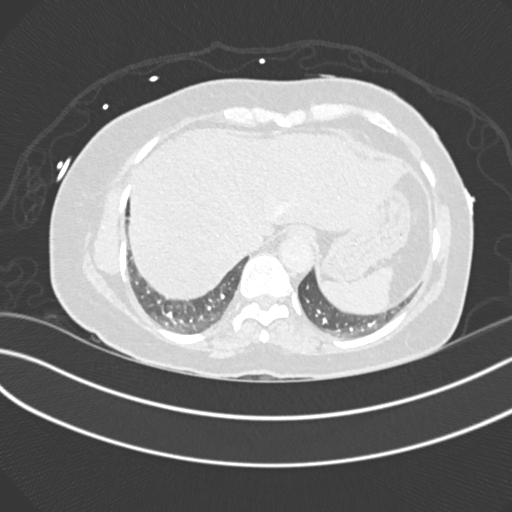
[im 50/219  soft-tissue]
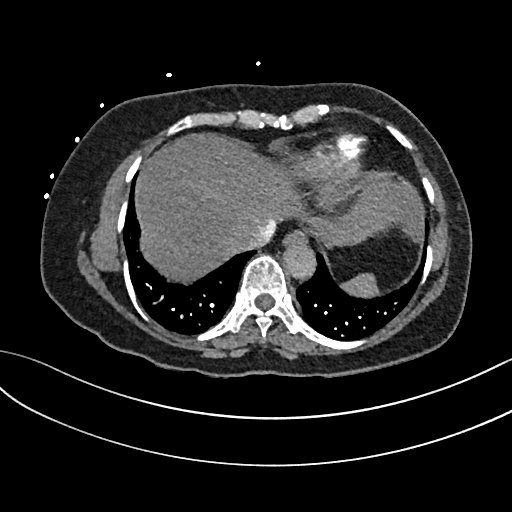
[im 70/219  lung]
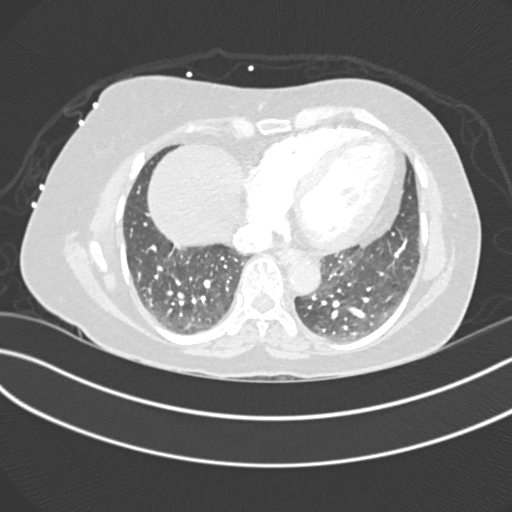
[im 80/219  soft-tissue]
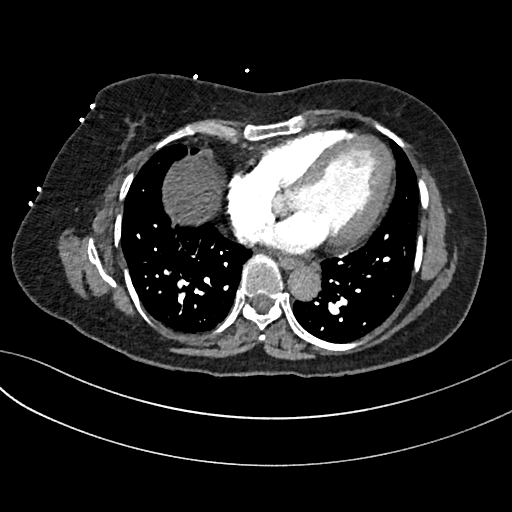
[im 100/219  lung]
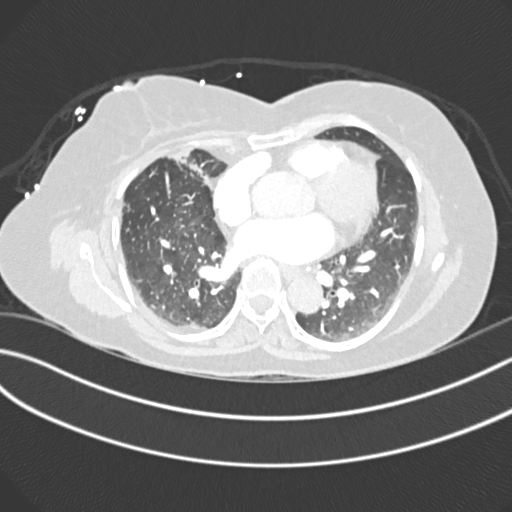
[im 109/219  soft-tissue]
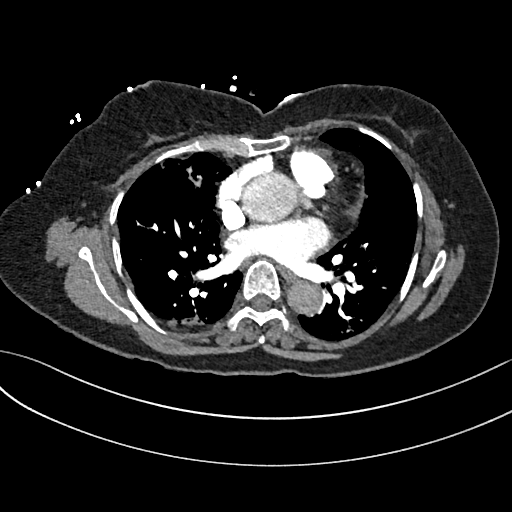
[im 119/219  lung]
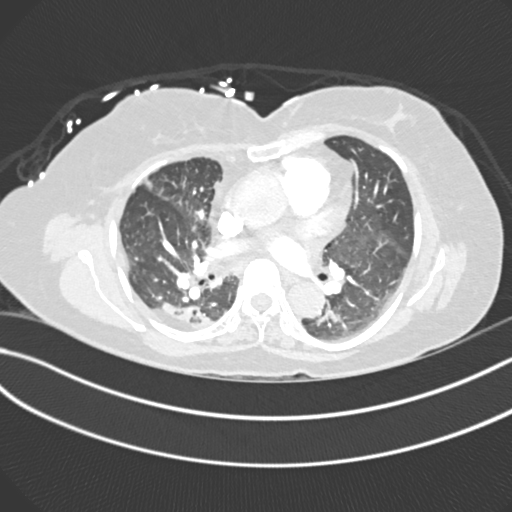
[im 139/219  soft-tissue]
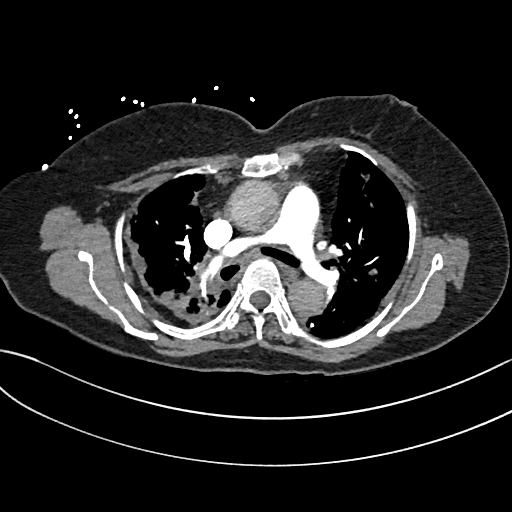
[im 149/219  lung]
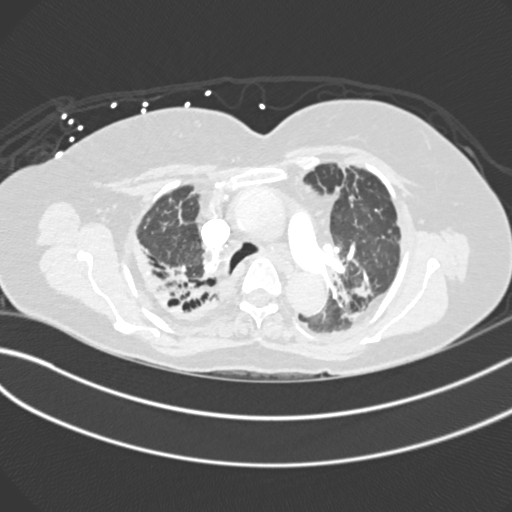
[im 169/219  soft-tissue]
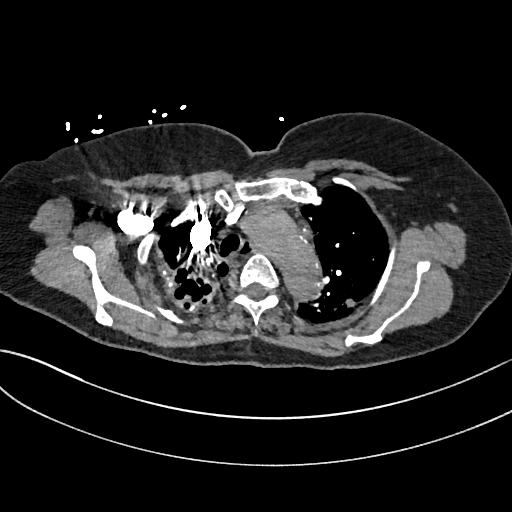
[im 179/219  lung]
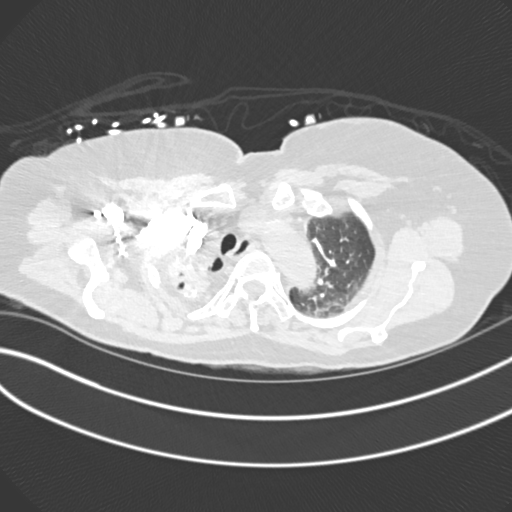
[im 189/219  soft-tissue]
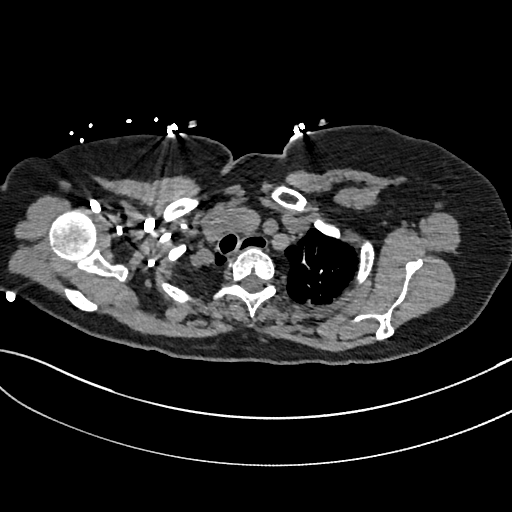
[im 209/219  lung]
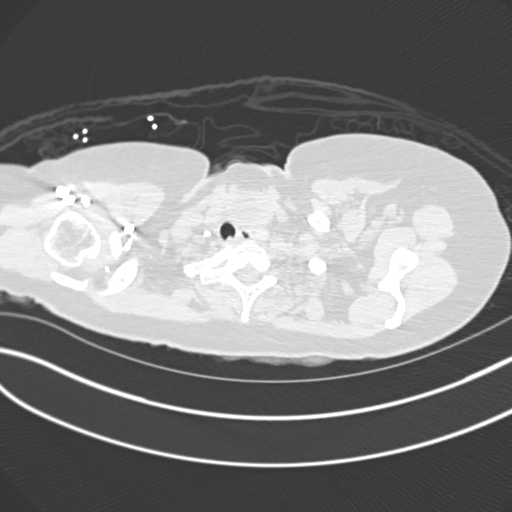

[Series 7: coronal mpr · coronal · 0.47mm/px · 3 of 101 slices shown]
[im 26/101  soft-tissue]
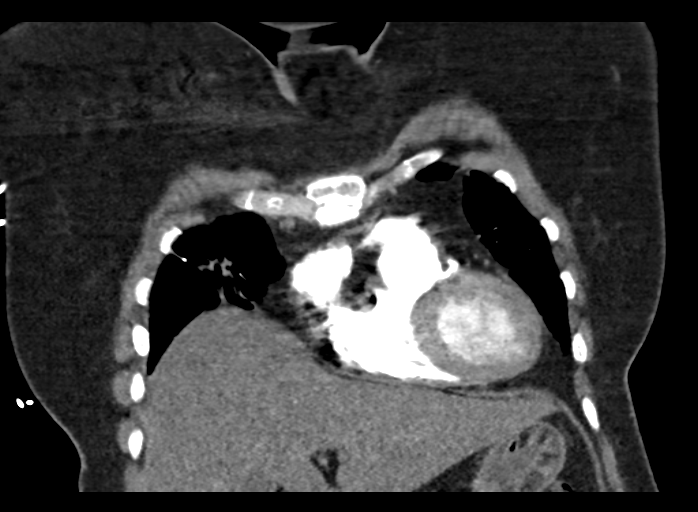
[im 51/101  soft-tissue]
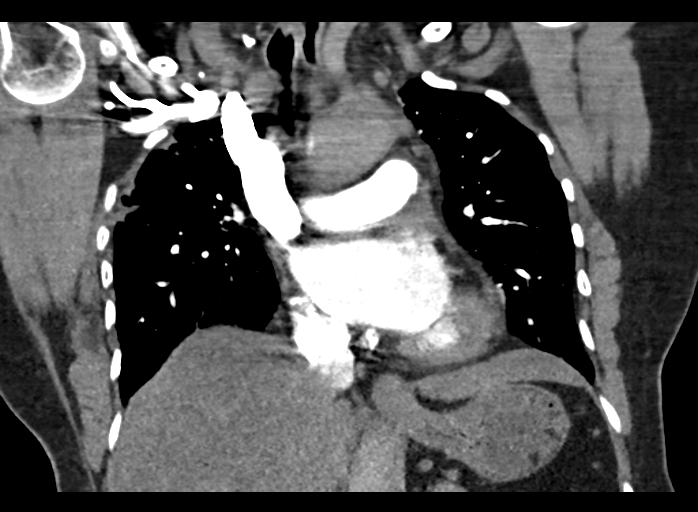
[im 76/101  soft-tissue]
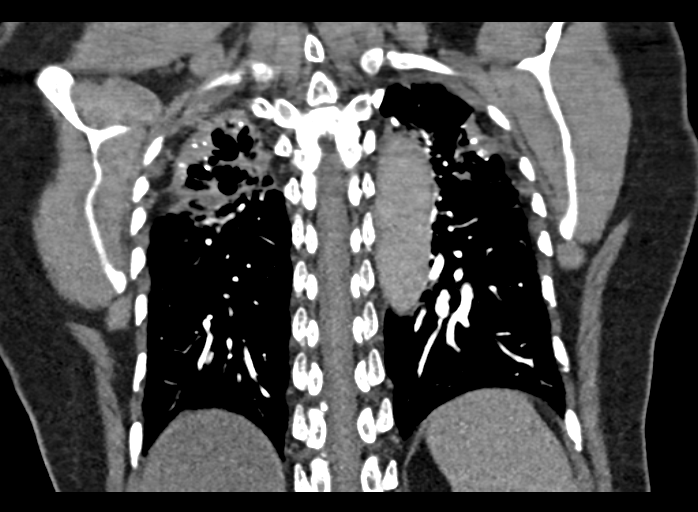

[18 of 46 positions shown; findings below may reference images not displayed]

FINDINGS: No pulmonary embolus is identified. Large low attenuating lesion in
the left lobe of the thyroid all measuring 2.5 cm is unchanged. No
axillary, hilar or mediastinal lymphadenopathy is identified. There
is no pleural or pericardial effusion. Ectasia of the ascending
aorta is unchanged.

Lungs again demonstrate sequela of prior tuberculosis with biapical
scarring, worse on the right, where there is some cavitation
present. A 0.6 cm right lower lobe nodule is unchanged on image 32.
Second right lower lobe nodule measuring 0.5 cm is unchanged on
image 37. No new or enlarging pulmonary nodules are identified.

Visualized upper abdomen demonstrates fatty infiltration of the
liver. The patient is status post cholecystectomy. No focal bony
abnormality is identified.

Review of the MIP images confirms the above findings.
IMPRESSION: Negative for pulmonary embolus or acute cardiopulmonary disease.

No change in the appearance of the lungs with sequela of remote
tuberculosis again seen.

Fatty infiltration of the liver.

## 2015-03-09 ENCOUNTER — Ambulatory Visit: Payer: Self-pay | Admitting: Internal Medicine

## 2015-03-30 ENCOUNTER — Encounter: Payer: Self-pay | Admitting: Internal Medicine

## 2015-03-30 ENCOUNTER — Ambulatory Visit: Payer: Self-pay | Attending: Internal Medicine | Admitting: Internal Medicine

## 2015-03-30 VITALS — BP 118/82 | HR 96 | Temp 98.1°F | Resp 16 | Ht 61.5 in | Wt 128.6 lb

## 2015-03-30 DIAGNOSIS — M545 Low back pain, unspecified: Secondary | ICD-10-CM

## 2015-03-30 DIAGNOSIS — I10 Essential (primary) hypertension: Secondary | ICD-10-CM | POA: Insufficient documentation

## 2015-03-30 DIAGNOSIS — G894 Chronic pain syndrome: Secondary | ICD-10-CM

## 2015-03-30 MED ORDER — TRAMADOL HCL 50 MG PO TABS: 50.0000 mg | ORAL_TABLET | Freq: Two times a day (BID) | ORAL | Status: DC | PRN

## 2015-03-30 MED ORDER — GABAPENTIN 300 MG PO CAPS: 300.0000 mg | ORAL_CAPSULE | Freq: Every day | ORAL | Status: DC

## 2015-03-30 MED ORDER — LISINOPRIL-HYDROCHLOROTHIAZIDE 10-12.5 MG PO TABS
1.0000 | ORAL_TABLET | Freq: Every day | ORAL | Status: DC
Start: 2015-03-30 — End: 2015-11-05

## 2015-03-30 NOTE — Progress Notes (Signed)
Patient ID: Ashley Jacobs, female   DOB: [birth year], 59 y.o.   MRN: [id number]  CC: back pain   HPI: Ashley Jacobs is a 59 y.o. female here today for a follow up visit for lower back pain.  Patient has past medical history of HTN and chronic pain. Pain rated at a 7. Patient reports the pain feels like something is grabbing her. Patient reports she previously had the same pain in her lower back a while ago but medication helped it. Patient states the pain returned about 20-22 days ago. Pain reported to be worse in the morning, rated at a 9-10. Pain is constant. She denies bowel and bladder dysfunction. She reports that she has been out of her gabapentin for 2 months and tramadol for one month.   Patient has No headache, No chest pain, No abdominal pain - No Nausea, No new weakness tingling or numbness, No Cough - SOB.  No Known Allergies Past Medical History  Diagnosis Date  . Hypertension   . Chronic pain     right hip  . Tuberculosis ~ 1980    "took medicine for it 6 months"  . Leg DVT (deep venous thromboembolism), chronic 2009; 06/02/2012    "always left"  . Anginal pain 05/30/2012  . Shortness of breath 05/30/2012   Current Outpatient Prescriptions on File Prior to Visit  Medication Sig Dispense Refill  . gabapentin (NEURONTIN) 300 MG capsule Take 1 capsule (300 mg total) by mouth at bedtime. Take half tablet nightly for one week, then switch to whole nightly 30 capsule 2  . lisinopril-hydrochlorothiazide (ZESTORETIC) 10-12.5 MG per tablet Take 1 tablet by mouth daily. 30 tablet 4  . traMADol (ULTRAM) 50 MG tablet Take 1 tablet (50 mg total) by mouth every 12 (twelve) hours as needed for moderate pain. 60 tablet 1   No current facility-administered medications on file prior to visit.   History reviewed. No pertinent family history. History   Social History  . Marital Status: Married    Spouse Name: N/A  . Number of Children: N/A  . Years of Education: N/A   Occupational  History  . Not on file.   Social History Main Topics  . Smoking status: Never Smoker   . Smokeless tobacco: Never Used  . Alcohol Use: No  . Drug Use: No  . Sexual Activity: No   Other Topics Concern  . Not on file   Social History Narrative    Review of Systems: See HPI    Objective:   Filed Vitals:   03/30/15 1431  BP: 118/82  Pulse: 96  Temp: 98.1 F (36.7 C)  Resp: 16    Physical Exam  Constitutional: She is oriented to person, place, and time.  Cardiovascular: Normal rate, regular rhythm and normal heart sounds.   Pulmonary/Chest: Effort normal and breath sounds normal.  Musculoskeletal: Normal range of motion. She exhibits no edema or tenderness.  Gait normal  Neurological: She is alert and oriented to person, place, and time.     Lab Results  Component Value Date   WBC 7.9 07/03/2014   HGB 13.4 07/03/2014   HCT 39.1 07/03/2014   MCV 88.1 07/03/2014   PLT 272 07/03/2014   Lab Results  Component Value Date   CREATININE 0.85 07/03/2014   BUN 20 07/03/2014   NA 139 07/03/2014   K 3.9 07/03/2014   CL 99 07/03/2014   CO2 29 07/03/2014    No results found for: HGBA1C Lipid Panel  Component Value Date/Time   CHOL 175 04/03/2010 2145   TRIG 107 04/03/2010 2145   HDL 56 04/03/2010 2145   CHOLHDL 3.1 Ratio 04/03/2010 2145   VLDL 21 04/03/2010 2145   LDLCALC 98 04/03/2010 2145       Assessment and plan:   [name] was seen today for back pain.  Diagnoses and all orders for this visit:  Bilateral low back pain without sciatica Orders: -     Ambulatory referral to Orthopedic Surgery  Essential hypertension Orders: -    refill lisinopril-hydrochlorothiazide (ZESTORETIC) 10-12.5 MG per tablet; Take 1 tablet by mouth daily. Patient blood pressure is stable and may continue on current medication.  Education on diet, exercise, and modifiable risk factors discussed. Will obtain appropriate labs as needed. Will follow up in 3-6 months.    Chronic pain syndrome Orders: -     gabapentin (NEURONTIN) 300 MG capsule; Take 1 capsule (300 mg total) by mouth at bedtime. -     traMADol (ULTRAM) 50 MG tablet; Take 1 tablet (50 mg total) by mouth every 12 (twelve) hours as needed for moderate pain.   Return in about 6 months (around 09/30/2015) for Hypertension.       Ambrose FinlandValerie A Mesa Janus, NP-C [facility] [phone] 03/30/2015, 2:36 PM

## 2015-03-30 NOTE — Progress Notes (Signed)
Patient here for lower back pain. Pain today rated at a 7. Patient reports the pain feels like something is grabbing her. Patient reports she previously had the same pain in her lower back a while ago but medication helped it. Patient states the pain returned about 20-22 days ago. Pain reported to be worse in the morning, rated at a 9-10. Pain is constant. Patient reports she has pain in her left leg sometimes, but currently has no pain in it.   Patient needs refills on all her medications. Patient has only taken her lisinopril today. Patient has been out of gabapentin for two months and tramadol for about a month and a half.   Patient is accompanied by interpreter, Bishnu Paudel, from [employer].

## 2015-08-29 ENCOUNTER — Other Ambulatory Visit: Payer: Self-pay | Admitting: Internal Medicine

## 2015-09-05 NOTE — Telephone Encounter (Signed)
error 

## 2015-09-26 MED FILL — GABAPENTIN 300 MG CAPSULE: 300 | 30 days supply | Qty: 30 | Fill #5

## 2015-10-05 MED FILL — LISINOPRIL-HCTZ 10-12.5 MG: 10-12.5 | 30 days supply | Qty: 30 | Fill #5

## 2015-10-31 MED FILL — GABAPENTIN 300 MG CAPSULE: 300 | 30 days supply | Qty: 30 | Fill #1

## 2015-11-05 ENCOUNTER — Telehealth: Payer: Self-pay | Admitting: Internal Medicine

## 2015-11-05 ENCOUNTER — Telehealth: Payer: Self-pay

## 2015-11-05 DIAGNOSIS — I10 Essential (primary) hypertension: Secondary | ICD-10-CM

## 2015-11-05 DIAGNOSIS — G894 Chronic pain syndrome: Secondary | ICD-10-CM

## 2015-11-05 MED ORDER — TRAMADOL HCL 50 MG PO TABS: 50.0000 mg | ORAL_TABLET | Freq: Two times a day (BID) | ORAL | Status: DC | PRN

## 2015-11-05 MED ORDER — LISINOPRIL-HYDROCHLOROTHIAZIDE 10-12.5 MG PO TABS: 1.0000 | ORAL_TABLET | Freq: Every day | ORAL | Status: DC

## 2015-11-05 MED FILL — LISINOPRIL-HCTZ 10-12.5 MG: 10-12.5 | 30 days supply | Qty: 30 | Fill #0

## 2015-11-05 NOTE — Telephone Encounter (Signed)
May give her 30 days of both and have her schedule appointment

## 2015-11-05 NOTE — Telephone Encounter (Signed)
Patient requesting a refill on her lisinopril and tramadol Patient has not been seen in the office since July Does she need to schedule and appointment

## 2015-11-05 NOTE — Addendum Note (Signed)
Addended by: Lestine Mount on: 11/05/2015 04:51 PM   Modules accepted: Orders, Medications

## 2015-11-05 NOTE — Telephone Encounter (Signed)
Pt. Called requesting a med refill on the following medications:  lisinopril-hydrochlorothiazide (ZESTORETIC) 10-12.5 MG per tablet traMADol (ULTRAM) 50 MG tablet    Please f/u

## 2015-11-07 ENCOUNTER — Other Ambulatory Visit: Payer: Self-pay | Admitting: Internal Medicine

## 2015-11-09 ENCOUNTER — Other Ambulatory Visit: Payer: Self-pay | Admitting: Internal Medicine

## 2015-11-12 ENCOUNTER — Ambulatory Visit: Payer: Self-pay | Attending: Internal Medicine | Admitting: Internal Medicine

## 2015-11-12 ENCOUNTER — Encounter: Payer: Self-pay | Admitting: Internal Medicine

## 2015-11-12 VITALS — BP 135/89 | HR 83 | Temp 98.0°F | Resp 16 | Ht 62.0 in | Wt 137.0 lb

## 2015-11-12 DIAGNOSIS — G8929 Other chronic pain: Secondary | ICD-10-CM | POA: Insufficient documentation

## 2015-11-12 DIAGNOSIS — G894 Chronic pain syndrome: Secondary | ICD-10-CM

## 2015-11-12 DIAGNOSIS — I10 Essential (primary) hypertension: Secondary | ICD-10-CM

## 2015-11-12 DIAGNOSIS — M549 Dorsalgia, unspecified: Secondary | ICD-10-CM | POA: Insufficient documentation

## 2015-11-12 DIAGNOSIS — Z79899 Other long term (current) drug therapy: Secondary | ICD-10-CM | POA: Insufficient documentation

## 2015-11-12 MED ORDER — TRAMADOL HCL 50 MG PO TABS: 50.0000 mg | ORAL_TABLET | Freq: Two times a day (BID) | ORAL | Status: DC | PRN

## 2015-11-12 MED ORDER — GABAPENTIN 300 MG PO CAPS: 300.0000 mg | ORAL_CAPSULE | Freq: Every day | ORAL | Status: DC

## 2015-11-12 MED ORDER — LISINOPRIL-HYDROCHLOROTHIAZIDE 10-12.5 MG PO TABS: 1.0000 | ORAL_TABLET | Freq: Every day | ORAL | Status: DC

## 2015-11-12 MED FILL — traMADol HCL 50 MG TABS: 50 | 30 days supply | Qty: 60 | Fill #0

## 2015-11-12 NOTE — Progress Notes (Signed)
Patient ID: Ashley Jacobs, female   DOB: Nov 10, 1955, 60 y.o.   MRN: [id number] Subjective:  Ashley Jacobs is a 60 y.o. female with hypertension. Patient reports that she has continued to have generalized body aches. Pain is mostly located in lower lumbar spine and is achy. She states that while taking gabapentin she feels fine but when she stops she has pain. No associated bowel or bladder dysfunction.  Current Outpatient Prescriptions  Medication Sig Dispense Refill  . gabapentin (NEURONTIN) 300 MG capsule Take 1 capsule (300 mg total) by mouth at bedtime. 30 capsule 5  . lisinopril-hydrochlorothiazide (ZESTORETIC) 10-12.5 MG tablet Take 1 tablet by mouth daily. 30 tablet 0  . traMADol (ULTRAM) 50 MG tablet Take 1 tablet (50 mg total) by mouth every 12 (twelve) hours as needed for moderate pain. 60 tablet 0   No current facility-administered medications for this visit.    Hypertension ROS: taking medications as instructed, no medication side effects noted, no TIA's, no chest pain on exertion, no dyspnea on exertion and no swelling of ankles.   Objective:  BP 135/89 mmHg  Pulse 83  Temp(Src) 98 F (36.7 C)  Resp 16  Ht  (1.575 m)  Wt 137 lb (62.143 kg)  BMI 25.05 kg/m2  SpO2 100%  Appearance alert, well appearing, and in no distress, oriented to person, place, and time and normal appearing weight. General exam BP noted to be well controlled today in office, S1, S2 normal, no gallop, no murmur, chest clear, no JVD, no HSM, no edema.  Lab review: orders written for new lab studies as appropriate; see orders.   Assessment:   Edan was seen today for follow-up.  Diagnoses and all orders for this visit:  Essential hypertension -     lisinopril-hydrochlorothiazide (ZESTORETIC) 10-12.5 MG tablet; Take 1 tablet by mouth daily. -     Basic metabolic panel; Future -     Lipid panel; Future Patient blood pressure is stable and may continue on current medication.  Education on diet,  exercise, and modifiable risk factors discussed. Will obtain appropriate labs as needed. Will follow up in 3-6 months.   Chronic pain syndrome -     gabapentin (NEURONTIN) 300 MG capsule; Take 1 capsule (300 mg total) by mouth at bedtime. -     traMADol (ULTRAM) 50 MG tablet; Take 1 tablet (50 mg total) by mouth every 12 (twelve) hours as needed for moderate pain. -     Vitamin D, 25-hydroxy; Future When patient is approved for [facility] discount I will place order for Lumbar spine xray to find why she is having chronic back pain.    Plan:  Reviewed diet, exercise and weight control. Recommended sodium restriction. Copy of written low fat low cholesterol diet provided and reviewed. Cardiovascular risk and specific lipid/LDL goals reviewed.  Return for Thursday--lab visit and 3 mo PCP HTN.  Ambrose Finland, NP 11/12/2015 5:17 PM

## 2015-11-12 NOTE — Progress Notes (Signed)
Interpreter line used  Patient here for follow up on her HTN Complains of having generalized body aches that has  Been on going Also in need of medication refills

## 2015-11-16 ENCOUNTER — Ambulatory Visit: Payer: Self-pay | Attending: Internal Medicine

## 2015-11-16 DIAGNOSIS — I10 Essential (primary) hypertension: Secondary | ICD-10-CM | POA: Insufficient documentation

## 2015-11-16 LAB — BASIC METABOLIC PANEL
BUN: 10 mg/dL (ref 7–25)
CO2: 29 mmol/L (ref 20–31)
Calcium: 9.7 mg/dL (ref 8.6–10.4)
Chloride: 99 mmol/L (ref 98–110)
Creat: 0.76 mg/dL (ref 0.50–1.05)
GLUCOSE: 81 mg/dL (ref 65–99)
POTASSIUM: 3.9 mmol/L (ref 3.5–5.3)
Sodium: 136 mmol/L (ref 135–146)

## 2015-11-16 LAB — LIPID PANEL
CHOL/HDL RATIO: 3.4 ratio (ref <=5.0)
CHOLESTEROL: 140 mg/dL (ref 125–200)
HDL: 41 mg/dL — ABNORMAL LOW (ref >=46)
LDL CALC: 75 mg/dL (ref <130)
Triglycerides: 119 mg/dL (ref <150)
VLDL: 24 mg/dL (ref <30)

## 2015-11-19 ENCOUNTER — Telehealth: Payer: Self-pay

## 2015-11-19 NOTE — Telephone Encounter (Signed)
-----   Message from Ambrose FinlandValerie A Keck, NP sent at 11/19/2015 12:36 PM EST ----- Labs normal

## 2015-11-19 NOTE — Telephone Encounter (Signed)
Interpreter line used Rina # [phone] Patient not available  unable to leave message  Mail box is full

## 2015-12-11 MED FILL — GABAPENTIN 300 MG CAPSULE: 300 | 30 days supply | Qty: 30 | Fill #0

## 2015-12-11 MED FILL — LISINOPRIL-HCTZ 10-12.5 MG: 10-12.5 | 30 days supply | Qty: 30 | Fill #0

## 2016-01-11 MED FILL — GABAPENTIN 300 MG CAPSULE: 300 | 30 days supply | Qty: 30 | Fill #1

## 2016-01-11 MED FILL — LISINOPRIL-HCTZ 10-12.5 MG: 10-12.5 | 30 days supply | Qty: 30 | Fill #1

## 2016-02-12 ENCOUNTER — Other Ambulatory Visit: Payer: Self-pay | Admitting: Internal Medicine

## 2016-02-12 MED FILL — LISINOPRIL-HCTZ 10-12.5 MG: 10-12.5 | 30 days supply | Qty: 30 | Fill #2

## 2016-02-12 MED FILL — GABAPENTIN 300 MG CAPSULE: 300 | 30 days supply | Qty: 30 | Fill #2

## 2016-03-12 MED FILL — LISINOPRIL-HCTZ 10-12.5 MG: 10-12.5 | 30 days supply | Qty: 30 | Fill #3

## 2016-03-25 MED FILL — GABAPENTIN 300 MG CAPSULE: 300 | 30 days supply | Qty: 30 | Fill #3

## 2016-04-14 MED FILL — LISINOPRIL-HCTZ 10-12.5 MG: 10-12.5 | 30 days supply | Qty: 30 | Fill #4

## 2016-04-25 MED FILL — GABAPENTIN 300 MG CAPSULE: 300 | 30 days supply | Qty: 30 | Fill #4

## 2016-05-20 MED FILL — LISINOPRIL-HCTZ 10-12.5 MG: 10-12.5 | 30 days supply | Qty: 30 | Fill #5

## 2016-06-03 ENCOUNTER — Telehealth: Payer: Self-pay | Admitting: Internal Medicine

## 2016-06-03 NOTE — Telephone Encounter (Signed)
Pt's family member came into office requesting medication refill on traMADol (ULTRAM) 50 MG tablet,gabapentin (NEURONTIN) 300 MG capsule, lisinopril-hydrochlorothiazide (ZESTORETIC) 10-12.5 MG tablet Please f/up

## 2016-06-09 ENCOUNTER — Other Ambulatory Visit: Payer: Self-pay | Admitting: Internal Medicine

## 2016-06-09 DIAGNOSIS — G894 Chronic pain syndrome: Secondary | ICD-10-CM

## 2016-06-09 DIAGNOSIS — I10 Essential (primary) hypertension: Secondary | ICD-10-CM

## 2016-06-09 MED ORDER — GABAPENTIN 300 MG PO CAPS: 300.0000 mg | ORAL_CAPSULE | Freq: Every day | ORAL | 5 refills | Status: DC

## 2016-06-09 MED ORDER — LISINOPRIL-HYDROCHLOROTHIAZIDE 10-12.5 MG PO TABS: 1.0000 | ORAL_TABLET | Freq: Every day | ORAL | 5 refills | Status: DC

## 2016-06-09 MED ORDER — TRAMADOL HCL 50 MG PO TABS: 50.0000 mg | ORAL_TABLET | Freq: Four times a day (QID) | ORAL | 0 refills | Status: DC | PRN

## 2016-06-09 NOTE — Telephone Encounter (Signed)
Pt's family member came into office requesting medication refill on traMADol (ULTRAM) 50 MG tablet,gabapentin (NEURONTIN) 300 MG capsule, lisinopril-hydrochlorothiazide (ZESTORETIC) 10-12.5 MG tablet Please f/up °

## 2016-06-09 NOTE — Telephone Encounter (Signed)
Medications have been refilled by Dr. Hyman HopesJegede.

## 2016-06-18 ENCOUNTER — Ambulatory Visit: Payer: Self-pay | Attending: Family Medicine | Admitting: Family Medicine

## 2016-06-18 ENCOUNTER — Encounter: Payer: Self-pay | Admitting: Family Medicine

## 2016-06-18 VITALS — BP 129/85 | HR 89 | Temp 97.7°F | Ht 62.0 in | Wt 138.4 lb

## 2016-06-18 DIAGNOSIS — Z79899 Other long term (current) drug therapy: Secondary | ICD-10-CM | POA: Insufficient documentation

## 2016-06-18 DIAGNOSIS — K5900 Constipation, unspecified: Secondary | ICD-10-CM | POA: Insufficient documentation

## 2016-06-18 DIAGNOSIS — G894 Chronic pain syndrome: Secondary | ICD-10-CM

## 2016-06-18 DIAGNOSIS — R079 Chest pain, unspecified: Secondary | ICD-10-CM | POA: Insufficient documentation

## 2016-06-18 DIAGNOSIS — K5909 Other constipation: Secondary | ICD-10-CM

## 2016-06-18 DIAGNOSIS — I10 Essential (primary) hypertension: Secondary | ICD-10-CM

## 2016-06-18 DIAGNOSIS — Z23 Encounter for immunization: Secondary | ICD-10-CM

## 2016-06-18 MED ORDER — POLYETHYLENE GLYCOL 3350 17 GM/SCOOP PO POWD: 17.0000 g | Freq: Every day | ORAL | 1 refills | Status: DC

## 2016-06-18 MED ORDER — LISINOPRIL-HYDROCHLOROTHIAZIDE 10-12.5 MG PO TABS: 1.0000 | ORAL_TABLET | Freq: Every day | ORAL | 5 refills | Status: DC

## 2016-06-18 MED ORDER — GABAPENTIN 300 MG PO CAPS: 300.0000 mg | ORAL_CAPSULE | Freq: Every day | ORAL | 5 refills | Status: DC

## 2016-06-18 MED FILL — LISINOPRIL-HCTZ 10-12.5 MG: 10-12.5 | 30 days supply | Qty: 30 | Fill #0

## 2016-06-18 MED FILL — GABAPENTIN 300 MG CAPSULE: 300 | 30 days supply | Qty: 30 | Fill #0

## 2016-06-18 MED FILL — POLYETHYLENE GLYCOL 3350: 30 days supply | Qty: 510 | Fill #0

## 2016-06-18 NOTE — Progress Notes (Signed)
Subjective:  Patient ID: Ashley Jacobs, female    DOB: December 07, 1955  Age: 60 y.o. MRN: [id number]  CC: Hypertension; Neck Pain (has improved); and Chest Pain   HPI Ashley Jacobs is a 60 year old female with a history of hypertension, chronic pain syndrome who presents to establish care with me. She was previously followed by the nurse practitioner who is no longer with the clinic. Requests refills of her medications.  Endorses intermittent constipation and would like to have a prescription for a laxative. Denies nausea, vomiting or abdominal pain. She is not up-to-date on her Pap smear and mammogram and is willing to receive the flu shot today.  Past Medical History:  Diagnosis Date  . Anginal pain (HCC) 05/30/2012  . Chronic pain    right hip  . Hypertension   . Leg DVT (deep venous thromboembolism), chronic (HCC) 2009; 06/02/2012   "always left"  . Shortness of breath 05/30/2012  . Tuberculosis ~ 1980   "took medicine for it 6 months"    Past Surgical History:  Procedure Laterality Date  . VENA CAVA FILTER PLACEMENT  06/02/12    No Known Allergies  Outpatient Medications Prior to Visit  Medication Sig Dispense Refill  . gabapentin (NEURONTIN) 300 MG capsule Take 1 capsule (300 mg total) by mouth at bedtime. 30 capsule 5  . lisinopril-hydrochlorothiazide (ZESTORETIC) 10-12.5 MG tablet Take 1 tablet by mouth daily. 30 tablet 5  . traMADol (ULTRAM) 50 MG tablet Take 1 tablet (50 mg total) by mouth every 6 (six) hours as needed. (Patient not taking: Reported on 06/18/2016) 60 tablet 0   No facility-administered medications prior to visit.     ROS Review of Systems  Constitutional: Negative for activity change, appetite change and fatigue.  HENT: Negative for congestion, sinus pressure and sore throat.   Eyes: Negative for visual disturbance.  Respiratory: Negative for cough, chest tightness, shortness of breath and wheezing.   Cardiovascular: Negative for chest pain and  palpitations.  Gastrointestinal: Positive for constipation. Negative for abdominal distention and abdominal pain.  Endocrine: Negative for polydipsia.  Genitourinary: Negative for dysuria and frequency.  Musculoskeletal: Positive for myalgias. Negative for arthralgias and back pain.  Skin: Negative for rash.  Neurological: Negative for tremors, light-headedness and numbness.  Hematological: Does not bruise/bleed easily.  Psychiatric/Behavioral: Negative for agitation and behavioral problems.    Objective:  BP 129/85 (BP Location: Right Arm, Patient Position: Sitting, Cuff Size: Large)   Pulse 89   Temp 97.7 F (36.5 C) (Oral)   Ht 5\' 2"  (1.575 m)   Wt 138 lb 6.4 oz (62.8 kg)   SpO2 96%   BMI 25.31 kg/m   BP/Weight 06/18/2016 11/12/2015 03/30/2015  Systolic BP 129 135 118  Diastolic BP 85 89 82  Wt. (Lbs) 138.4 137 128.6  BMI 25.31 25.05 23.91      Physical Exam  Constitutional: She is oriented to person, place, and time. She appears well-developed and well-nourished.  Cardiovascular: Normal rate, normal heart sounds and intact distal pulses.   No murmur heard. Pulmonary/Chest: Effort normal and breath sounds normal. She has no wheezes. She has no rales. She exhibits no tenderness.  Abdominal: Soft. Bowel sounds are normal. She exhibits no distension and no mass. There is no tenderness.  Musculoskeletal: Normal range of motion.  Neurological: She is alert and oriented to person, place, and time.   CMP Latest Ref Rng & Units 11/16/2015 07/03/2014 01/06/2014  Glucose 65 - 99 mg/dL 81 119(J) 478(G)  BUN  7 - 25 mg/dL 10 20 10   Creatinine 0.50 - 1.05 mg/dL 5.180.76 8.410.85 6.600.80  Sodium 135 - 146 mmol/L 136 139 141  Potassium 3.5 - 5.3 mmol/L 3.9 3.9 3.8  Chloride 98 - 110 mmol/L 99 99 100  CO2 20 - 31 mmol/L 29 29 -  Calcium 8.6 - 10.4 mg/dL 9.7 9.6 -  Total Protein 6.0 - 8.3 g/dL - - -  Total Bilirubin 0.3 - 1.2 mg/dL - - -  Alkaline Phos 39 - 117 U/L - - -  AST 0 - 37 U/L - - -    ALT 0 - 35 U/L - - -    Lipid Panel     Component Value Date/Time   CHOL 140 11/16/2015 1051   TRIG 119 11/16/2015 1051   HDL 41 (L) 11/16/2015 1051   CHOLHDL 3.4 11/16/2015 1051   VLDL 24 11/16/2015 1051   LDLCALC 75 11/16/2015 1051     Assessment & Plan:   1. Chronic pain syndrome Controlled on gabapentin - gabapentin (NEURONTIN) 300 MG capsule; Take 1 capsule (300 mg total) by mouth at bedtime.  Dispense: 30 capsule; Refill: 5  2. Essential hypertension Controlled - lisinopril-hydrochlorothiazide (ZESTORETIC) 10-12.5 MG tablet; Take 1 tablet by mouth daily.  Dispense: 30 tablet; Refill: 5  3. Other constipation Discussed dietary modifications to reduce constipation - polyethylene glycol powder (GLYCOLAX/MIRALAX) powder; Take 17 g by mouth daily.  Dispense: 3350 g; Refill: 1  4. Encounter for immunization - Flu Vaccine QUAD 36+ mos IM   Meds ordered this encounter  Medications  . gabapentin (NEURONTIN) 300 MG capsule    Sig: Take 1 capsule (300 mg total) by mouth at bedtime.    Dispense:  30 capsule    Refill:  5  . lisinopril-hydrochlorothiazide (ZESTORETIC) 10-12.5 MG tablet    Sig: Take 1 tablet by mouth daily.    Dispense:  30 tablet    Refill:  5  . polyethylene glycol powder (GLYCOLAX/MIRALAX) powder    Sig: Take 17 g by mouth daily.    Dispense:  3350 g    Refill:  1    Follow-up: Return in about 3 weeks (around 07/09/2016) for complete physical exam.   Jaclyn ShaggyEnobong Amao MD

## 2016-06-18 NOTE — Progress Notes (Signed)
Medication refills- would like tramadol

## 2016-07-18 MED FILL — LISINOPRIL-HCTZ 10-12.5 MG: 10-12.5 | 30 days supply | Qty: 30 | Fill #1

## 2016-07-18 MED FILL — GABAPENTIN 300 MG CAPSULE: 300 | 30 days supply | Qty: 30 | Fill #1

## 2016-08-20 MED FILL — GABAPENTIN 300 MG CAPSULE: 300 | 30 days supply | Qty: 30 | Fill #2

## 2016-08-20 MED FILL — LISINOPRIL-HCTZ 10-12.5 MG: 10-12.5 | 30 days supply | Qty: 30 | Fill #2

## 2016-09-18 MED FILL — LISINOPRIL-HCTZ 10-12.5 MG: 10-12.5 | 30 days supply | Qty: 30 | Fill #3

## 2016-09-18 MED FILL — GABAPENTIN 300 MG CAPSULE: 300 | 30 days supply | Qty: 30 | Fill #3

## 2016-10-10 ENCOUNTER — Encounter: Payer: Self-pay | Admitting: Family Medicine

## 2016-10-10 ENCOUNTER — Ambulatory Visit: Payer: Self-pay | Attending: Family Medicine | Admitting: Family Medicine

## 2016-10-10 VITALS — BP 122/85 | HR 99 | Temp 98.4°F | Ht 63.0 in | Wt 142.8 lb

## 2016-10-10 DIAGNOSIS — R509 Fever, unspecified: Secondary | ICD-10-CM | POA: Insufficient documentation

## 2016-10-10 DIAGNOSIS — J0110 Acute frontal sinusitis, unspecified: Secondary | ICD-10-CM

## 2016-10-10 DIAGNOSIS — Z79899 Other long term (current) drug therapy: Secondary | ICD-10-CM | POA: Insufficient documentation

## 2016-10-10 DIAGNOSIS — R51 Headache: Secondary | ICD-10-CM | POA: Insufficient documentation

## 2016-10-10 DIAGNOSIS — R05 Cough: Secondary | ICD-10-CM | POA: Insufficient documentation

## 2016-10-10 DIAGNOSIS — G894 Chronic pain syndrome: Secondary | ICD-10-CM | POA: Insufficient documentation

## 2016-10-10 DIAGNOSIS — J321 Chronic frontal sinusitis: Secondary | ICD-10-CM | POA: Insufficient documentation

## 2016-10-10 DIAGNOSIS — J029 Acute pharyngitis, unspecified: Secondary | ICD-10-CM | POA: Insufficient documentation

## 2016-10-10 DIAGNOSIS — I10 Essential (primary) hypertension: Secondary | ICD-10-CM | POA: Insufficient documentation

## 2016-10-10 MED ORDER — AMOXICILLIN 500 MG PO CAPS: 500.0000 mg | ORAL_CAPSULE | Freq: Three times a day (TID) | ORAL | 0 refills | Status: DC

## 2016-10-10 MED ORDER — BENZONATATE 100 MG PO CAPS: 100.0000 mg | ORAL_CAPSULE | Freq: Two times a day (BID) | ORAL | 0 refills | Status: DC | PRN

## 2016-10-10 MED ORDER — LISINOPRIL-HYDROCHLOROTHIAZIDE 10-12.5 MG PO TABS: 1.0000 | ORAL_TABLET | Freq: Every day | ORAL | 5 refills | Status: DC

## 2016-10-10 MED ORDER — GABAPENTIN 300 MG PO CAPS: 300.0000 mg | ORAL_CAPSULE | Freq: Every day | ORAL | 5 refills | Status: DC

## 2016-10-10 NOTE — Progress Notes (Signed)
Subjective:  Patient ID: Ashley Jacobs, female    DOB: [birth year]  Age: 61 y.o. MRN: [id number]  CC: Fever; Sore Throat; Cough; and Headache   HPI Ashley Essexanka M Bourquin  is a 61 year old female with a history of hypertension, chronic pain syndrome who presents for a follow-up visit and endorses compliance with all her medications.  She complains of a 3 day history of dry cough, frontal sinus pain, chest pain associated with coughing, throat pain and reports a subjective fever at home. She also complains of postnasal drip and nausea. Used Tylenol with slight improvement in symptoms .  Requests refills of her medications.   Past Medical History:  Diagnosis Date  . Anginal pain (HCC) 05/30/2012  . Chronic pain    right hip  . Hypertension   . Leg DVT (deep venous thromboembolism), chronic (HCC) 2009; 06/02/2012   "always left"  . Shortness of breath 05/30/2012  . Tuberculosis ~ 1980   "took medicine for it 6 months"    Past Surgical History:  Procedure Laterality Date  . VENA CAVA FILTER PLACEMENT  06/02/12    No Known Allergies   Outpatient Medications Prior to Visit  Medication Sig Dispense Refill  . gabapentin (NEURONTIN) 300 MG capsule Take 1 capsule (300 mg total) by mouth at bedtime. 30 capsule 5  . lisinopril-hydrochlorothiazide (ZESTORETIC) 10-12.5 MG tablet Take 1 tablet by mouth daily. 30 tablet 5  . polyethylene glycol powder (GLYCOLAX/MIRALAX) powder Take 17 g by mouth daily. (Patient not taking: Reported on 10/10/2016) 3350 g 1  . traMADol (ULTRAM) 50 MG tablet Take 1 tablet (50 mg total) by mouth every 6 (six) hours as needed. (Patient not taking: Reported on 06/18/2016) 60 tablet 0   No facility-administered medications prior to visit.     ROS Review of Systems  Constitutional: Negative for activity change, appetite change and fatigue.  HENT: Positive for sore throat. Negative for congestion and sinus pressure.   Eyes: Negative for visual disturbance.    Respiratory: Positive for cough. Negative for chest tightness, shortness of breath and wheezing.   Cardiovascular: Negative for chest pain and palpitations.  Gastrointestinal: Negative for abdominal distention, abdominal pain and constipation.  Endocrine: Negative for polydipsia.  Genitourinary: Negative for dysuria and frequency.  Musculoskeletal: Negative for arthralgias and back pain.  Skin: Negative for rash.  Neurological: Negative for tremors, light-headedness and numbness.  Hematological: Does not bruise/bleed easily.  Psychiatric/Behavioral: Negative for agitation and behavioral problems.    Objective:  BP 122/85 (BP Location: Right Arm, Patient Position: Sitting, Cuff Size: Small)   Pulse 99   Temp 98.4 F (36.9 C) (Oral)   Ht 5\' 3"  (1.6 m)   Wt 142 lb 12.8 oz (64.8 kg)   SpO2 97%   BMI 25.30 kg/m   BP/Weight 10/10/2016 06/18/2016 11/12/2015  Systolic BP 122 129 135  Diastolic BP 85 85 89  Wt. (Lbs) 142.8 138.4 137  BMI 25.3 25.31 25.05      Physical Exam  Constitutional: She is oriented to person, place, and time. She appears well-developed and well-nourished.  Cardiovascular: Normal rate, normal heart sounds and intact distal pulses.   No murmur heard. Pulmonary/Chest: Effort normal and breath sounds normal. She has no wheezes. She has no rales. She exhibits no tenderness.  Abdominal: Soft. Bowel sounds are normal. She exhibits no distension and no mass. There is no tenderness.  Musculoskeletal: Normal range of motion.  Neurological: She is alert and oriented to person, place, and time.  Skin:  Skin is warm and dry.  Psychiatric: She has a normal mood and affect.     Assessment & Plan:   1. Chronic pain syndrome - gabapentin (NEURONTIN) 300 MG capsule; Take 1 capsule (300 mg total) by mouth at bedtime.  Dispense: 30 capsule; Refill: 5  2. Essential hypertension Controlled Low-sodium diet - lisinopril-hydrochlorothiazide (ZESTORETIC) 10-12.5 MG tablet;  Take 1 tablet by mouth daily.  Dispense: 30 tablet; Refill: 5 - COMPLETE METABOLIC PANEL WITH GFR; Future - Lipid Panel w/reflex Direct LDL; Future  3. Acute non-recurrent frontal sinusitis Use OTC Tylenol, rest, fluids - amoxicillin (AMOXIL) 500 MG capsule; Take 1 capsule (500 mg total) by mouth 3 (three) times daily.  Dispense: 30 capsule; Refill: 0 - benzonatate (TESSALON) 100 MG capsule; Take 1 capsule (100 mg total) by mouth 2 (two) times daily as needed for cough.  Dispense: 20 capsule; Refill: 0   Meds ordered this encounter  Medications  . gabapentin (NEURONTIN) 300 MG capsule    Sig: Take 1 capsule (300 mg total) by mouth at bedtime.    Dispense:  30 capsule    Refill:  5  . lisinopril-hydrochlorothiazide (ZESTORETIC) 10-12.5 MG tablet    Sig: Take 1 tablet by mouth daily.    Dispense:  30 tablet    Refill:  5  . amoxicillin (AMOXIL) 500 MG capsule    Sig: Take 1 capsule (500 mg total) by mouth 3 (three) times daily.    Dispense:  30 capsule    Refill:  0  . benzonatate (TESSALON) 100 MG capsule    Sig: Take 1 capsule (100 mg total) by mouth 2 (two) times daily as needed for cough.    Dispense:  20 capsule    Refill:  0    Follow-up: Return in about 3 months (around 01/08/2017) for Follow-up of hypertension.   Jaclyn Shaggy MD

## 2016-10-10 NOTE — Patient Instructions (Signed)

## 2016-10-17 ENCOUNTER — Ambulatory Visit: Payer: Self-pay | Attending: Family Medicine

## 2016-10-17 DIAGNOSIS — G894 Chronic pain syndrome: Secondary | ICD-10-CM | POA: Insufficient documentation

## 2016-10-17 DIAGNOSIS — I10 Essential (primary) hypertension: Secondary | ICD-10-CM | POA: Insufficient documentation

## 2016-10-17 LAB — COMPLETE METABOLIC PANEL WITH GFR
ALT: 28 U/L (ref 6–29)
AST: 24 U/L (ref 10–35)
Albumin: 3.6 g/dL (ref 3.6–5.1)
Alkaline Phosphatase: 64 U/L (ref 33–130)
BUN: 12 mg/dL (ref 7–25)
CO2: 27 mmol/L (ref 20–31)
Calcium: 8.5 mg/dL — ABNORMAL LOW (ref 8.6–10.4)
Chloride: 103 mmol/L (ref 98–110)
Creat: 0.76 mg/dL (ref 0.50–1.05)
GFR, Est African American: >89 mL/min (ref >=60)
GFR, Est Non African American: 87 mL/min (ref >=60)
Glucose, Bld: 86 mg/dL (ref 65–99)
POTASSIUM: 3.7 mmol/L (ref 3.5–5.3)
SODIUM: 137 mmol/L (ref 135–146)
Total Bilirubin: 0.9 mg/dL (ref 0.2–1.2)
Total Protein: 7.5 g/dL (ref 6.1–8.1)

## 2016-10-17 LAB — LIPID PANEL W/REFLEX DIRECT LDL
CHOL/HDL RATIO: 4 ratio (ref <5.0)
Cholesterol: 120 mg/dL (ref <200)
HDL: 30 mg/dL — ABNORMAL LOW (ref >50)
LDL-Cholesterol: 72 mg/dL
Non-HDL Cholesterol (Calc): 90 mg/dL (ref <130)
Triglycerides: 101 mg/dL (ref <150)

## 2016-10-17 NOTE — Progress Notes (Signed)
Patient here for lab visit only 

## 2016-10-18 LAB — VITAMIN D 25 HYDROXY (VIT D DEFICIENCY, FRACTURES): VIT D 25 HYDROXY: 18 ng/mL — AB (ref 30–100)

## 2016-10-20 MED FILL — LISINOPRIL-HCTZ 10-12.5 MG: 10-12.5 | 30 days supply | Qty: 30 | Fill #0

## 2016-10-20 MED FILL — GABAPENTIN 300 MG CAPSULE: 300 | 30 days supply | Qty: 30 | Fill #0

## 2016-11-18 MED FILL — GABAPENTIN 300 MG CAPSULE: 300 | 30 days supply | Qty: 30 | Fill #1

## 2016-11-18 MED FILL — LISINOPRIL-HCTZ 10-12.5 MG: 10-12.5 | 30 days supply | Qty: 30 | Fill #1

## 2016-11-21 ENCOUNTER — Telehealth: Payer: Self-pay

## 2016-11-21 NOTE — Telephone Encounter (Signed)
Writer called patient through [employer] with lab results.  Writer spoke with her son who stated understanding.

## 2016-11-21 NOTE — Telephone Encounter (Signed)
-----   Message from Jaclyn ShaggyEnobong Amao, MD sent at 10/20/2016 12:42 PM EST ----- Please inform the patient that labs are normal. Thank you.

## 2016-12-18 MED FILL — LISINOPRIL-HCTZ 10-12.5 MG: 10-12.5 | 30 days supply | Qty: 30 | Fill #2

## 2016-12-18 MED FILL — GABAPENTIN 300 MG CAPSULE: 300 | 30 days supply | Qty: 30 | Fill #2

## 2017-01-09 ENCOUNTER — Telehealth: Payer: Self-pay | Admitting: Family Medicine

## 2017-01-09 NOTE — Telephone Encounter (Signed)
PT son called to speak with the nurse since his Mom has high BP and he would like to know what he can do since is not appt available, please f/u

## 2017-01-12 NOTE — Telephone Encounter (Signed)
Call routed to writer/ nurse during hours nurse was not in office. Attempt to call patient to speak with son who provided message. Unable to reach, busy signal x 2.

## 2017-01-14 NOTE — Telephone Encounter (Signed)
Apt scheduled for patient.  

## 2017-01-15 ENCOUNTER — Encounter: Payer: Self-pay | Admitting: Family Medicine

## 2017-01-15 ENCOUNTER — Ambulatory Visit: Payer: Self-pay | Attending: Family Medicine | Admitting: Family Medicine

## 2017-01-15 VITALS — BP 109/78 | HR 88 | Temp 98.0°F | Resp 18 | Ht 62.0 in | Wt 141.0 lb

## 2017-01-15 DIAGNOSIS — Z79899 Other long term (current) drug therapy: Secondary | ICD-10-CM | POA: Insufficient documentation

## 2017-01-15 DIAGNOSIS — E559 Vitamin D deficiency, unspecified: Secondary | ICD-10-CM | POA: Insufficient documentation

## 2017-01-15 DIAGNOSIS — G894 Chronic pain syndrome: Secondary | ICD-10-CM | POA: Insufficient documentation

## 2017-01-15 DIAGNOSIS — Z9889 Other specified postprocedural states: Secondary | ICD-10-CM | POA: Insufficient documentation

## 2017-01-15 DIAGNOSIS — Z86718 Personal history of other venous thrombosis and embolism: Secondary | ICD-10-CM | POA: Insufficient documentation

## 2017-01-15 DIAGNOSIS — I10 Essential (primary) hypertension: Secondary | ICD-10-CM | POA: Insufficient documentation

## 2017-01-15 DIAGNOSIS — K219 Gastro-esophageal reflux disease without esophagitis: Secondary | ICD-10-CM | POA: Insufficient documentation

## 2017-01-15 DIAGNOSIS — G4700 Insomnia, unspecified: Secondary | ICD-10-CM | POA: Insufficient documentation

## 2017-01-15 DIAGNOSIS — R110 Nausea: Secondary | ICD-10-CM | POA: Insufficient documentation

## 2017-01-15 MED ORDER — CYCLOBENZAPRINE HCL 10 MG PO TABS: 10.0000 mg | ORAL_TABLET | Freq: Two times a day (BID) | ORAL | 2 refills | Status: DC | PRN

## 2017-01-15 MED ORDER — OMEPRAZOLE 20 MG PO CPDR
20.0000 mg | DELAYED_RELEASE_CAPSULE | Freq: Every day | ORAL | 3 refills | Status: DC
Start: 2017-01-15 — End: 2017-01-20

## 2017-01-15 MED ORDER — ERGOCALCIFEROL 1.25 MG (50000 UT) PO CAPS: 50000.0000 [IU] | ORAL_CAPSULE | ORAL | 0 refills | Status: DC

## 2017-01-15 MED ORDER — GABAPENTIN 300 MG PO CAPS: 300.0000 mg | ORAL_CAPSULE | Freq: Every day | ORAL | 5 refills | Status: DC

## 2017-01-15 MED ORDER — LISINOPRIL-HYDROCHLOROTHIAZIDE 10-12.5 MG PO TABS: 1.0000 | ORAL_TABLET | Freq: Every day | ORAL | 5 refills | Status: DC

## 2017-01-15 MED FILL — CYCLOBENZAPRINE 10 MG TAB: 10 | 30 days supply | Qty: 60 | Fill #0

## 2017-01-15 MED FILL — VIT D2 1.25 MG (50,000 UNIT: 1.25 MG | 28 days supply | Qty: 4 | Fill #0

## 2017-01-15 MED FILL — LISINOPRIL-HCTZ 10-12.5 MG: 10-12.5 | 30 days supply | Qty: 30 | Fill #3

## 2017-01-15 MED FILL — OMEPRAZOLE DR 20 MG CAPSULE: 20 | 30 days supply | Qty: 30 | Fill #0

## 2017-01-15 MED FILL — GABAPENTIN 300 MG CAPSULE: 300 | 30 days supply | Qty: 30 | Fill #3

## 2017-01-15 NOTE — Progress Notes (Signed)
Subjective:  Patient ID: Ashley Jacobs, female    DOB: 07-17-1956  Age: 61 y.o. MRN: [id number]  CC: Follow-up visit  HPI Ashley Jacobs is a 61 year old female with a history of hypertension, chronic pain syndrome who presents for a follow-up visit and endorses compliance with all her medications.  She has chronic pain and points to her shoulders, her thighs, bilateral hips as sources of the pain for which she takes gabapentin with minimal relief in symptoms. Complains of muscle aches in all muscle groups.  She points to her epigastrium and complains of pain which radiates to the back and endorses some nausea but no vomiting, diarrhea, constipation.  Review of last of labs indicated a low vitamin D level however she has not been on vitamin D replacement.  Past Medical History:  Diagnosis Date  . Anginal pain (HCC) 05/30/2012  . Chronic pain    right hip  . Hypertension   . Leg DVT (deep venous thromboembolism), chronic (HCC) 2009; 06/02/2012   "always left"  . Shortness of breath 05/30/2012  . Tuberculosis ~ 1980   "took medicine for it 6 months"    Past Surgical History:  Procedure Laterality Date  . VENA CAVA FILTER PLACEMENT  06/02/12    No Known Allergies   Outpatient Medications Prior to Visit  Medication Sig Dispense Refill  . acetaminophen (TYLENOL) 325 MG tablet Take 650 mg by mouth every 6 (six) hours as needed.    . gabapentin (NEURONTIN) 300 MG capsule Take 1 capsule (300 mg total) by mouth at bedtime. 30 capsule 5  . lisinopril-hydrochlorothiazide (ZESTORETIC) 10-12.5 MG tablet Take 1 tablet by mouth daily. 30 tablet 5  . polyethylene glycol powder (GLYCOLAX/MIRALAX) powder Take 17 g by mouth daily. (Patient not taking: Reported on 01/15/2017) 3350 g 1  . traMADol (ULTRAM) 50 MG tablet Take 1 tablet (50 mg total) by mouth every 6 (six) hours as needed. (Patient not taking: Reported on 06/18/2016) 60 tablet 0  . amoxicillin (AMOXIL) 500 MG capsule Take 1 capsule  (500 mg total) by mouth 3 (three) times daily. (Patient not taking: Reported on 01/15/2017) 30 capsule 0  . benzonatate (TESSALON) 100 MG capsule Take 1 capsule (100 mg total) by mouth 2 (two) times daily as needed for cough. (Patient not taking: Reported on 01/15/2017) 20 capsule 0   No facility-administered medications prior to visit.     ROS Review of Systems  Constitutional: Negative for activity change, appetite change and fatigue.  HENT: Negative for congestion, sinus pressure and sore throat.   Eyes: Negative for visual disturbance.  Respiratory: Negative for cough, chest tightness, shortness of breath and wheezing.   Cardiovascular: Negative for chest pain and palpitations.  Gastrointestinal: Positive for abdominal pain. Negative for abdominal distention and constipation.  Endocrine: Negative for polydipsia.  Genitourinary: Negative for dysuria and frequency.  Musculoskeletal: Positive for myalgias. Negative for arthralgias and back pain.  Skin: Negative for rash.  Neurological: Negative for tremors, light-headedness and numbness.  Hematological: Does not bruise/bleed easily.  Psychiatric/Behavioral: Negative for agitation, behavioral problems and suicidal ideas.     Objective:  BP 109/78 (BP Location: Left Arm, Patient Position: Sitting, Cuff Size: Normal)   Pulse 88   Temp 98 F (36.7 C) (Oral)   Resp 18   Ht 5\' 2"  (1.575 m)   Wt 141 lb (64 kg)   BMI 25.79 kg/m   BP/Weight 01/15/2017 10/10/2016 06/18/2016  Systolic BP 109 122 129  Diastolic BP 78 85 85  Wt. (Lbs) 141 142.8 138.4  BMI 25.79 25.3 25.31     Physical Exam  Constitutional: She is oriented to person, place, and time. She appears well-developed and well-nourished.  Cardiovascular: Normal rate, normal heart sounds and intact distal pulses.   No murmur heard. Pulmonary/Chest: Effort normal and breath sounds normal. She has no wheezes. She has no rales. She exhibits no tenderness.  Abdominal: Soft. Bowel  sounds are normal. She exhibits no distension and no mass. There is tenderness (mild epigastric tenderness).  Musculoskeletal: Normal range of motion. She exhibits tenderness (Slight tenderness to palpation of diffuse muscle groups).  Neurological: She is alert and oriented to person, place, and time.  Skin: Skin is warm and dry.  Psychiatric: She has a normal mood and affect.     CMP Latest Ref Rng & Units 10/17/2016 11/16/2015 07/03/2014  Glucose 65 - 99 mg/dL 86 81 161(W)  BUN 7 - 25 mg/dL 12 10 20   Creatinine 0.50 - 1.05 mg/dL 9.60 4.54 0.98  Sodium 135 - 146 mmol/L 137 136 139  Potassium 3.5 - 5.3 mmol/L 3.7 3.9 3.9  Chloride 98 - 110 mmol/L 103 99 99  CO2 20 - 31 mmol/L 27 29 29   Calcium 8.6 - 10.4 mg/dL 1.1(B) 9.7 9.6  Total Protein 6.1 - 8.1 g/dL 7.5 - -  Total Bilirubin 0.2 - 1.2 mg/dL 0.9 - -  Alkaline Phos 33 - 130 U/L 64 - -  AST 10 - 35 U/L 24 - -  ALT 6 - 29 U/L 28 - -    CBC    Component Value Date/Time   WBC 7.9 07/03/2014 1752   RBC 4.44 07/03/2014 1752   HGB 13.4 07/03/2014 1752   HCT 39.1 07/03/2014 1752   PLT 272 07/03/2014 1752   MCV 88.1 07/03/2014 1752   MCH 30.2 07/03/2014 1752   MCHC 34.3 07/03/2014 1752   RDW 13.1 07/03/2014 1752   LYMPHSABS 2.1 06/08/2013 0139   MONOABS 0.5 06/08/2013 0139   EOSABS 0.1 06/08/2013 0139   BASOSABS 0.0 06/08/2013 0139    Lipid Panel     Component Value Date/Time   CHOL 120 10/17/2016 0836   TRIG 101 10/17/2016 0836   HDL 30 (L) 10/17/2016 0836   CHOLHDL 4.0 10/17/2016 0836   VLDL 24 11/16/2015 1051   LDLCALC 75 11/16/2015 1051     Assessment & Plan:   1. Essential hypertension Controlled - lisinopril-hydrochlorothiazide (ZESTORETIC) 10-12.5 MG tablet; Take 1 tablet by mouth daily.  Dispense: 30 tablet; Refill: 5  2. Chronic pain syndrome Will add a muscle relaxant which will also help with insomnia - gabapentin (NEURONTIN) 300 MG capsule; Take 1 capsule (300 mg total) by mouth at bedtime.  Dispense: 30  capsule; Refill: 5 - cyclobenzaprine (FLEXERIL) 10 MG tablet; Take 1 tablet (10 mg total) by mouth 2 (two) times daily as needed for muscle spasms.  Dispense: 60 tablet; Refill: 2  3. Vitamin D deficiency - ergocalciferol (DRISDOL) 50000 units capsule; Take 1 capsule (50,000 Units total) by mouth once a week.  Dispense: 9 capsule; Refill: 0  4. Gastroesophageal reflux disease without esophagitis Discussed dietary modifications to prevent reflux - H. pylori breath test - omeprazole (PRILOSEC) 20 MG capsule; Take 1 capsule (20 mg total) by mouth daily.  Dispense: 30 capsule; Refill: 3   Meds ordered this encounter  Medications  . lisinopril-hydrochlorothiazide (ZESTORETIC) 10-12.5 MG tablet    Sig: Take 1 tablet by mouth daily.    Dispense:  30 tablet  Refill:  5  . gabapentin (NEURONTIN) 300 MG capsule    Sig: Take 1 capsule (300 mg total) by mouth at bedtime.    Dispense:  30 capsule    Refill:  5  . ergocalciferol (DRISDOL) 50000 units capsule    Sig: Take 1 capsule (50,000 Units total) by mouth once a week.    Dispense:  9 capsule    Refill:  0  . cyclobenzaprine (FLEXERIL) 10 MG tablet    Sig: Take 1 tablet (10 mg total) by mouth 2 (two) times daily as needed for muscle spasms.    Dispense:  60 tablet    Refill:  2  . omeprazole (PRILOSEC) 20 MG capsule    Sig: Take 1 capsule (20 mg total) by mouth daily.    Dispense:  30 capsule    Refill:  3    Follow-up: Return in about 1 month (around 02/15/2017) for Complete physical exam.   Jaclyn ShaggyEnobong Amao MD

## 2017-01-15 NOTE — Progress Notes (Signed)
C/c body aches, shoulder arms and chest  Pain going on for a long time  Pain scale # 8 No tobacco user  No suicidal thoughts in the past two weeks  Used video camera interpretation # [id number] Pt here with son Greig Castillandrew

## 2017-01-15 NOTE — Patient Instructions (Signed)
Gastroesophageal Reflux Scan A gastroesophageal reflux scan is a procedure that is used to check for gastroesophageal reflux, which is the backward flow of stomach contents into the tube that carries food from the mouth to the stomach (esophagus). The scan can also show if any stomach contents are inhaled (aspirated) into your lungs. You may need this scan if you have symptoms such as heartburn, vomiting, swallowing problems, or regurgitation. Regurgitation means that swallowed food is returning from the stomach to the esophagus. For this scan, you will drink a liquid that contains a small amount of a radioactive substance (tracer). A scanner with a camera that detects the radioactive tracer is used to see if any of the material backs up into your esophagus. Tell a health care provider about:  Any allergies you have.  All medicines you are taking, including vitamins, herbs, eye drops, creams, and over-the-counter medicines.  Any blood disorders you have.  Any surgeries you have had.  Any medical conditions you have.  If you are pregnant or you think that you may be pregnant.  If you are breastfeeding. What are the risks? Generally, this is a safe procedure. However, problems may occur, including:  Exposure to radiation (a small amount).  Allergic reaction to the radioactive substance. This is rare. What happens before the procedure?  Ask your health care provider about changing or stopping your regular medicines. This is especially important if you are taking diabetes medicines or blood thinners.  Follow your health care provider's instructions about eating or drinking restrictions. What happens during the procedure?  You will be asked to drink a liquid that contains a small amount of a radioactive tracer. This liquid will probably be similar to orange juice.  You will assume a position lying on your back.  A series of images will be taken of your esophagus and upper  stomach.  You may be asked to move into different positions to help determine if reflux occurs more often when you are in specific positions.  For adults, an abdominal binder with an inflatable cuff may be placed on the belly (abdomen). This may be used to increase abdominal pressure. More images will be taken to see if the increased pressure causes reflux to occur. The procedure may vary among health care providers and hospitals. What happens after the procedure?  Return to your normal activities and your normal diet as directed by your health care provider.  The radioactive tracer will leave your body over the next few days. Drink enough fluid to keep your urine clear or pale yellow. This will help to flush the tracer out of your body.  It is your responsibility to obtain your test results. Ask your health care provider or the department performing the test when and how you will get your results. This information is not intended to replace advice given to you by your health care provider. Make sure you discuss any questions you have with your health care provider. Document Released: 10/23/2005 Document Revised: 05/26/2016 Document Reviewed: 06/13/2014 Elsevier Interactive Patient Education  2017 [employer]  

## 2017-01-17 LAB — H.PYLORI BREATH TEST (REFLEX): H. PYLORI BREATH TEST: POSITIVE — AB

## 2017-01-17 LAB — H. PYLORI BREATH TEST

## 2017-01-19 ENCOUNTER — Other Ambulatory Visit: Payer: Self-pay | Admitting: Family Medicine

## 2017-01-20 ENCOUNTER — Other Ambulatory Visit: Payer: Self-pay | Admitting: Family Medicine

## 2017-01-20 DIAGNOSIS — K219 Gastro-esophageal reflux disease without esophagitis: Secondary | ICD-10-CM

## 2017-01-20 MED ORDER — AMOXICILLIN 500 MG PO CAPS: 1000.0000 mg | ORAL_CAPSULE | Freq: Two times a day (BID) | ORAL | 0 refills | Status: DC

## 2017-01-20 MED ORDER — OMEPRAZOLE 20 MG PO CPDR: 20.0000 mg | DELAYED_RELEASE_CAPSULE | Freq: Two times a day (BID) | ORAL | 0 refills | Status: DC

## 2017-01-20 MED ORDER — CLARITHROMYCIN 500 MG PO TABS: 500.0000 mg | ORAL_TABLET | Freq: Two times a day (BID) | ORAL | 0 refills | Status: DC

## 2017-02-16 MED FILL — GABAPENTIN 300 MG CAPSULE: 300 | 30 days supply | Qty: 30 | Fill #4

## 2017-02-16 MED FILL — LISINOPRIL-HCTZ 10-12.5 MG: 10-12.5 | 30 days supply | Qty: 30 | Fill #4

## 2017-03-20 MED FILL — GABAPENTIN 300 MG CAPSULE: 300 | 30 days supply | Qty: 30 | Fill #5

## 2017-03-20 MED FILL — VIT D2 1.25 MG (50,000 UNIT: 1.25 MG | 28 days supply | Qty: 4 | Fill #1

## 2017-03-20 MED FILL — ?OMEPRAZOLE DR 20 MG CAPSUL: 20 | 30 days supply | Qty: 30 | Fill #1

## 2017-03-20 MED FILL — LISINOPRIL-HCTZ 10-12.5 MG: 10-12.5 | 30 days supply | Qty: 30 | Fill #5

## 2017-04-09 ENCOUNTER — Telehealth: Payer: Self-pay | Admitting: *Deleted

## 2017-04-09 NOTE — Telephone Encounter (Signed)
Interpreter Name: Merry LoftyChandra Interpreter #: [id number] Medical Assistant used [employer] to contact patient.  Patient was not available, [employer] left patient a voicemail. Voicemail states to give a call back to [place] with [facility] at [phone].  !!!Patient tested positive for h pylori, Patient needs medications from the pharmacy!!!

## 2017-04-09 NOTE — Telephone Encounter (Signed)
-----   Message from Jaclyn ShaggyEnobong Amao, MD sent at 01/20/2017  5:17 PM EDT ----- Tested positive for H.Pylori which could explain her abdominal symptoms and I have sent over three medications to her pharmacy for this.

## 2017-04-10 ENCOUNTER — Other Ambulatory Visit: Payer: Self-pay | Admitting: Family Medicine

## 2017-04-10 DIAGNOSIS — K219 Gastro-esophageal reflux disease without esophagitis: Secondary | ICD-10-CM

## 2017-04-10 MED ORDER — OMEPRAZOLE 20 MG PO CPDR: 20.0000 mg | DELAYED_RELEASE_CAPSULE | Freq: Two times a day (BID) | ORAL | 0 refills | Status: DC

## 2017-04-10 MED ORDER — AMOXICILLIN 500 MG PO CAPS: 1000.0000 mg | ORAL_CAPSULE | Freq: Two times a day (BID) | ORAL | 0 refills | Status: DC

## 2017-04-10 MED ORDER — CLARITHROMYCIN 500 MG PO TABS: 500.0000 mg | ORAL_TABLET | Freq: Two times a day (BID) | ORAL | 0 refills | Status: DC

## 2017-04-10 MED FILL — CLARITHROMYCIN 500 MG TAB: 500 | 14 days supply | Qty: 28 | Fill #0

## 2017-04-10 MED FILL — ?OMEPRAZOLE DR 20 MG CAPSUL: 20 | 14 days supply | Qty: 28 | Fill #0

## 2017-04-10 MED FILL — AMOXICILLIN 500 MG CAPSULE: 500 | 14 days supply | Qty: 56 | Fill #0

## 2017-04-23 MED FILL — GABAPENTIN 300 MG CAPSULE: 300 | 30 days supply | Qty: 30 | Fill #0

## 2017-04-23 MED FILL — VIT D2 1.25 MG (50,000 UNIT: 1.25 MG | 6 days supply | Qty: 1 | Fill #2

## 2017-04-23 MED FILL — LISINOPRIL-HCTZ 10-12.5 MG: 10-12.5 | 30 days supply | Qty: 30 | Fill #0

## 2017-05-13 ENCOUNTER — Other Ambulatory Visit: Payer: Self-pay | Admitting: Family Medicine

## 2017-05-13 DIAGNOSIS — K219 Gastro-esophageal reflux disease without esophagitis: Secondary | ICD-10-CM

## 2017-05-13 MED FILL — ?OMEPRAZOLE DR 20 MG CAPSUL: 20 | 14 days supply | Qty: 28 | Fill #0

## 2017-05-25 MED FILL — LISINOPRIL-HCTZ 10-12.5 MG: 10-12.5 | 30 days supply | Qty: 30 | Fill #1

## 2017-05-25 MED FILL — GABAPENTIN 300 MG CAPSULE: 300 | 30 days supply | Qty: 30 | Fill #1

## 2017-06-22 MED FILL — ?OMEPRAZOLE DR 20 MG CAPSUL: 20 | 30 days supply | Qty: 30 | Fill #2

## 2017-06-22 MED FILL — LISINOPRIL-HCTZ 10-12.5 MG: 10-12.5 | 30 days supply | Qty: 30 | Fill #2

## 2017-06-22 MED FILL — GABAPENTIN 300 MG CAPSULE: 300 | 30 days supply | Qty: 30 | Fill #2

## 2017-07-27 MED FILL — GABAPENTIN 300 MG CAPSULE: 300 | 30 days supply | Qty: 30 | Fill #3

## 2017-07-27 MED FILL — LISINOPRIL-HCTZ 10-12.5 MG: 10-12.5 | 30 days supply | Qty: 30 | Fill #3

## 2017-07-27 MED FILL — ?OMEPRAZOLE DR 20 MG CAPSUL: 20 | 30 days supply | Qty: 30 | Fill #3

## 2017-08-26 ENCOUNTER — Other Ambulatory Visit: Payer: Self-pay | Admitting: Family Medicine

## 2017-08-26 DIAGNOSIS — K219 Gastro-esophageal reflux disease without esophagitis: Secondary | ICD-10-CM

## 2017-08-26 MED FILL — LISINOPRIL-HCTZ 10-12.5 MG: 10-12.5 | 30 days supply | Qty: 30 | Fill #4

## 2017-08-26 MED FILL — GABAPENTIN 300 MG CAPSULE: 300 | 30 days supply | Qty: 30 | Fill #4

## 2017-08-27 MED FILL — ?OMEPRAZOLE DR 20MG CAPSULE: 20 | 30 days supply | Qty: 30 | Fill #0

## 2017-09-10 ENCOUNTER — Ambulatory Visit: Payer: Self-pay

## 2017-09-24 ENCOUNTER — Encounter: Payer: Self-pay | Admitting: Family Medicine

## 2017-09-24 ENCOUNTER — Ambulatory Visit: Payer: Self-pay | Attending: Family Medicine | Admitting: Family Medicine

## 2017-09-24 VITALS — BP 122/84 | HR 90 | Temp 97.4°F | Ht 62.0 in | Wt 146.4 lb

## 2017-09-24 DIAGNOSIS — Z23 Encounter for immunization: Secondary | ICD-10-CM | POA: Insufficient documentation

## 2017-09-24 DIAGNOSIS — Z79899 Other long term (current) drug therapy: Secondary | ICD-10-CM | POA: Insufficient documentation

## 2017-09-24 DIAGNOSIS — Z86718 Personal history of other venous thrombosis and embolism: Secondary | ICD-10-CM | POA: Insufficient documentation

## 2017-09-24 DIAGNOSIS — K219 Gastro-esophageal reflux disease without esophagitis: Secondary | ICD-10-CM | POA: Insufficient documentation

## 2017-09-24 DIAGNOSIS — G629 Polyneuropathy, unspecified: Secondary | ICD-10-CM | POA: Insufficient documentation

## 2017-09-24 DIAGNOSIS — G894 Chronic pain syndrome: Secondary | ICD-10-CM | POA: Insufficient documentation

## 2017-09-24 DIAGNOSIS — I10 Essential (primary) hypertension: Secondary | ICD-10-CM | POA: Insufficient documentation

## 2017-09-24 DIAGNOSIS — L299 Pruritus, unspecified: Secondary | ICD-10-CM | POA: Insufficient documentation

## 2017-09-24 MED ORDER — LISINOPRIL-HYDROCHLOROTHIAZIDE 10-12.5 MG PO TABS: 1.0000 | ORAL_TABLET | Freq: Every day | ORAL | 5 refills | Status: DC

## 2017-09-24 MED ORDER — OMEPRAZOLE 20 MG PO CPDR: 20.0000 mg | DELAYED_RELEASE_CAPSULE | Freq: Two times a day (BID) | ORAL | 5 refills | Status: DC

## 2017-09-24 MED ORDER — GABAPENTIN 300 MG PO CAPS: 300.0000 mg | ORAL_CAPSULE | Freq: Every day | ORAL | 5 refills | Status: DC

## 2017-09-24 MED ORDER — HYDROCORTISONE 0.5 % EX CREA: 1.0000 "application " | TOPICAL_CREAM | Freq: Two times a day (BID) | CUTANEOUS | 1 refills | Status: DC

## 2017-09-24 MED FILL — LISINOPRIL-HCTZ 10-12.5 MG: 10-12.5 | 30 days supply | Qty: 30 | Fill #0

## 2017-09-24 MED FILL — ?OMEPRAZOLE DR 20MG CAPSULE: 20 | 15 days supply | Qty: 30 | Fill #0

## 2017-09-24 MED FILL — GABAPENTIN 300 MG CAPSULE: 300 | 30 days supply | Qty: 30 | Fill #0

## 2017-09-24 NOTE — Progress Notes (Signed)
Subjective:  Patient ID: Ashley Jacobs, female    DOB: October 04, 1955  Age: 62 y.o. MRN: [id number]  CC: Hypertension   HPI Ashley Jacobs is a 62 year old female with a history of hypertension, chronic pain syndrome who presents for a follow-up visit.  She endorses compliance with her antihypertensive which she tolerates and denies adverse effect. She has run out of omeprazole and complains of epigastric burning; of note she was treated for Helicobacter pylori 8 months ago and completed a course of triple antibiotic regimen.  She complains of pruritus in her face and her hands which has been intermittent for the last 1 year with resulting rash outbreak and she denies any triggers and states symptoms are absent at the time of this encounter.  She denies the presence of allergies and does not use any over-the-counter creams to treat this. Pruritus is not limited to certain times of the day and no member of her household suffers from similar symptoms.  She also complains of pins and needles and numbness in her feet and review of her med list indicates she should be on gabapentin which she has not been taking.  Past Medical History:  Diagnosis Date  . Anginal pain ([place]) 05/30/2012  . Chronic pain    right hip  . Hypertension   . Leg DVT (deep venous thromboembolism), chronic ([place]) 2009; 06/02/2012   "always left"  . Shortness of breath 05/30/2012  . Tuberculosis ~ 1980   "took medicine for it 6 months"    Past Surgical History:  Procedure Laterality Date  . VENA CAVA FILTER PLACEMENT  06/02/12    No Known Allergies   Outpatient Medications Prior to Visit  Medication Sig Dispense Refill  . gabapentin (NEURONTIN) 300 MG capsule Take 1 capsule (300 mg total) by mouth at bedtime. 30 capsule 5  . lisinopril-hydrochlorothiazide (ZESTORETIC) 10-12.5 MG tablet Take 1 tablet by mouth daily. 30 tablet 5  . omeprazole (PRILOSEC) 20 MG capsule TAKE 1 CAPSULE (20 MG TOTAL) BY MOUTH 2 (TWO)  TIMES DAILY BEFORE A MEAL. 28 capsule 0  . acetaminophen (TYLENOL) 325 MG tablet Take 650 mg by mouth every 6 (six) hours as needed.    . traMADol (ULTRAM) 50 MG tablet Take 1 tablet (50 mg total) by mouth every 6 (six) hours as needed. (Patient not taking: Reported on 06/18/2016) 60 tablet 0  . amoxicillin (AMOXIL) 500 MG capsule Take 2 capsules (1,000 mg total) by mouth 2 (two) times daily. (Patient not taking: Reported on 09/24/2017) 56 capsule 0  . clarithromycin (BIAXIN) 500 MG tablet Take 1 tablet (500 mg total) by mouth 2 (two) times daily. (Patient not taking: Reported on 09/24/2017) 28 tablet 0  . cyclobenzaprine (FLEXERIL) 10 MG tablet Take 1 tablet (10 mg total) by mouth 2 (two) times daily as needed for muscle spasms. (Patient not taking: Reported on 09/24/2017) 60 tablet 2  . ergocalciferol (DRISDOL) 50000 units capsule Take 1 capsule (50,000 Units total) by mouth once a week. (Patient not taking: Reported on 09/24/2017) 9 capsule 0  . omeprazole (PRILOSEC) 20 MG capsule TAKE 1 CAPSULE BY MOUTH DAILY. (Patient not taking: Reported on 09/24/2017) 30 capsule 3  . polyethylene glycol powder (GLYCOLAX/MIRALAX) powder Take 17 g by mouth daily. (Patient not taking: Reported on 01/15/2017) 3350 g 1   No facility-administered medications prior to visit.     ROS Review of Systems  Constitutional: Negative for activity change, appetite change and fatigue.  HENT: Negative for congestion, sinus pressure  and sore throat.   Eyes: Negative for visual disturbance.  Respiratory: Negative for cough, chest tightness, shortness of breath and wheezing.   Cardiovascular: Negative for chest pain and palpitations.  Gastrointestinal: Negative for abdominal distention, abdominal pain and constipation.  Endocrine: Negative for polydipsia.  Genitourinary: Negative for dysuria and frequency.  Musculoskeletal: Negative for arthralgias and back pain.  Skin: Positive for rash.  Neurological: Positive for numbness.  Negative for tremors and light-headedness.  Hematological: Does not bruise/bleed easily.  Psychiatric/Behavioral: Negative for agitation and behavioral problems.    Objective:  BP 122/84   Pulse 90   Temp (!) 97.4 F (36.3 C) (Oral)   Ht _0  (1.575 m)   Wt 146 lb 6.4 oz (66.4 kg)   SpO2 90%   BMI 26.78 kg/m   BP/Weight 09/24/2017 01/15/2017 7/98/9211  Systolic BP 941 740 814  Diastolic BP 84 78 85  Wt. (Lbs) 146.4 141 142.8  BMI 26.78 25.79 25.3      Physical Exam  Constitutional: She is oriented to person, place, and time. She appears well-developed and well-nourished.  Cardiovascular: Normal rate, normal heart sounds and intact distal pulses.  No murmur heard. Pulmonary/Chest: Effort normal and breath sounds normal. She has no wheezes. She has no rales. She exhibits no tenderness.  Abdominal: Soft. Bowel sounds are normal. She exhibits no distension and no mass. There is no tenderness.  Musculoskeletal: Normal range of motion.  Neurological: She is alert and oriented to person, place, and time.  Skin: No rash noted.  Dry skin on extremities  Psychiatric: She has a normal mood and affect.     Assessment & Plan:   1. Essential hypertension Controlled Counseled on blood pressure goal of less than 130/80, low-sodium, DASH diet, medication compliance, 150 minutes of moderate intensity exercise per week. Discussed medication compliance, adverse effects. - lisinopril-hydrochlorothiazide (ZESTORETIC) 10-12.5 MG tablet; Take 1 tablet by mouth daily.  Dispense: 30 tablet; Refill: 5 - CMP14+EGFR - Lipid panel  2. Gastroesophageal reflux disease without esophagitis Uncontrolled as she has been out of her PPI which I have refilled - omeprazole (PRILOSEC) 20 MG capsule; Take 1 capsule (20 mg total) by mouth 2 (two) times daily before a meal.  Dispense: 30 capsule; Refill: 5  3. Chronic pain syndrome Uncontrolled as she has not been taking gabapentin unable to do it appears on  her med list - gabapentin (NEURONTIN) 300 MG capsule; Take 1 capsule (300 mg total) by mouth at bedtime.  Dispense: 30 capsule; Refill: 5  4. Neuropathy Refilled gabapentin  5. Pruritus Unknown etiology -my exam does not reveal the presence of a rash Placed on hydrocortisone cream Advised to use moisturizers on her skin as she does appear to have some xerosis  6. Need for influenza vaccination - Flu Vaccine QUAD 36+ mos IM   Meds ordered this encounter  Medications  . lisinopril-hydrochlorothiazide (ZESTORETIC) 10-12.5 MG tablet    Sig: Take 1 tablet by mouth daily.    Dispense:  30 tablet    Refill:  5  . omeprazole (PRILOSEC) 20 MG capsule    Sig: Take 1 capsule (20 mg total) by mouth 2 (two) times daily before a meal.    Dispense:  30 capsule    Refill:  5  . gabapentin (NEURONTIN) 300 MG capsule    Sig: Take 1 capsule (300 mg total) by mouth at bedtime.    Dispense:  30 capsule    Refill:  5  . hydrocortisone cream 0.5 %  Sig: Apply 1 application topically 2 (two) times daily.    Dispense:  30 g    Refill:  1    Follow-up: Return in about 1 month (around 10/25/2017) for Complete physical exam.   Arnoldo Morale MD

## 2017-09-24 NOTE — Patient Instructions (Signed)
Pruritus  Pruritus is an itching feeling. There are many different conditions and factors that can make your skin itchy. Dry skin is one of the most common causes of itching. Most cases of itching do not require medical attention. Itchy skin can turn into a rash.  Follow these instructions at home:  Watch your pruritus for any changes. Take these steps to help with your condition:  Skin Care  · Moisturize your skin as needed. A moisturizer that contains petroleum jelly is best for keeping moisture in your skin.  · Take or apply medicines only as directed by your health care provider. This may include:  ? Corticosteroid cream.  ? Anti-itch lotions.  ? Oral anti-histamines.  · Apply cool compresses to the affected areas.  · Try taking a bath with:  ? Epsom salts. Follow the instructions on the packaging. You can get these at your local pharmacy or grocery store.  ? Baking soda. Pour a small amount into the bath as directed by your health care provider.  ? Colloidal oatmeal. Follow the instructions on the packaging. You can get this at your local pharmacy or grocery store.  · Try applying baking soda paste to your skin. Stir water into baking soda until it reaches a paste-like consistency.  · Do not scratch your skin.  · Avoid hot showers or baths, which can make itching worse. A cold shower may help with itching as long as you use a moisturizer after.  · Avoid scented soaps, detergents, and perfumes. Use gentle soaps, detergents, perfumes, and other cosmetic products.  General instructions  · Avoid wearing tight clothes.  · Keep a journal to help track what causes your itch. Write down:  ? What you eat.  ? What cosmetic products you use.  ? What you drink.  ? What you wear. This includes jewelry.  · Use a humidifier. This keeps the air moist, which helps to prevent dry skin.  Contact a health care provider if:  · The itching does not go away after several days.  · You sweat at night.  · You have weight loss.  · You  are unusually thirsty.  · You urinate more than normal.  · You are more tired than normal.  · You have abdominal pain.  · Your skin tingles.  · You feel weak.  · Your skin or the whites of your eyes look yellow (jaundice).  · Your skin feels numb.  This information is not intended to replace advice given to you by your health care provider. Make sure you discuss any questions you have with your health care provider.  Document Released: 05/14/2011 Document Revised: 02/07/2016 Document Reviewed: 08/28/2014  Elsevier Interactive Patient Education © 2018 [employer]

## 2017-09-25 LAB — CMP14+EGFR
ALT: 32 IU/L (ref 0–32)
AST: 26 IU/L (ref 0–40)
Albumin/Globulin Ratio: 1.4 (ref 1.2–2.2)
Albumin: 4.6 g/dL (ref 3.5–5.5)
Alkaline Phosphatase: 91 IU/L (ref 39–117)
BILIRUBIN TOTAL: 0.5 mg/dL (ref 0.0–1.2)
BUN / CREAT RATIO: 17 (ref 9–23)
BUN: 13 mg/dL (ref 6–24)
CHLORIDE: 100 mmol/L (ref 96–106)
CO2: 25 mmol/L (ref 20–29)
CREATININE: 0.75 mg/dL (ref 0.57–1.00)
Calcium: 9.4 mg/dL (ref 8.7–10.2)
GFR calc Af Amer: 101 mL/min/{1.73_m2} (ref >59)
GFR calc non Af Amer: 88 mL/min/{1.73_m2} (ref >59)
GLOBULIN, TOTAL: 3.3 g/dL (ref 1.5–4.5)
GLUCOSE: 86 mg/dL (ref 65–99)
Potassium: 4.1 mmol/L (ref 3.5–5.2)
Sodium: 142 mmol/L (ref 134–144)
Total Protein: 7.9 g/dL (ref 6.0–8.5)

## 2017-09-25 LAB — LIPID PANEL
Chol/HDL Ratio: 3.4 ratio (ref 0.0–4.4)
Cholesterol, Total: 155 mg/dL (ref 100–199)
HDL: 45 mg/dL (ref >39)
LDL CALC: 95 mg/dL (ref 0–99)
Triglycerides: 77 mg/dL (ref 0–149)
VLDL CHOLESTEROL CAL: 15 mg/dL (ref 5–40)

## 2017-10-09 ENCOUNTER — Ambulatory Visit: Payer: Self-pay | Attending: Family Medicine

## 2017-10-12 ENCOUNTER — Telehealth: Payer: Self-pay

## 2017-10-12 NOTE — Telephone Encounter (Signed)
Pt was called and informed of lab results via interpretor. 

## 2017-10-26 MED FILL — ?OMEPRAZOLE 20 MG CPDR: 20 | 15 days supply | Qty: 30 | Fill #1

## 2017-10-26 MED FILL — LISINOPRIL-HCTZ 10-12.5 MG: 10-12.5 | 30 days supply | Qty: 30 | Fill #1

## 2017-10-26 MED FILL — GABAPENTIN 300 MG CAPSULE: 300 | 30 days supply | Qty: 30 | Fill #1

## 2017-11-23 MED FILL — LISINOPRIL-HCTZ 10-12.5 MG: 10-12.5 | 30 days supply | Qty: 30 | Fill #2

## 2017-11-23 MED FILL — GABAPENTIN 300 MG CAPSULE: 300 | 30 days supply | Qty: 30 | Fill #2

## 2017-11-23 MED FILL — OMEPRAZOLE DR 20 MG CAPSULE: 20 | 15 days supply | Qty: 30 | Fill #2

## 2017-12-23 MED FILL — OMEPRAZOLE 20 MG CAP: 20 | 15 days supply | Qty: 30 | Fill #3

## 2017-12-23 MED FILL — LISINOPRIL-HCTZ 10-12.5 MG: 10-12.5 | 30 days supply | Qty: 30 | Fill #3

## 2017-12-23 MED FILL — GABAPENTIN 300 MG CAPSULE: 300 | 30 days supply | Qty: 30 | Fill #3

## 2018-01-22 MED FILL — OMEPRAZOLE 20 MG CAP: 20 | 15 days supply | Qty: 30 | Fill #4

## 2018-01-22 MED FILL — LISINOPRIL-HCTZ 10-12.5 MG: 10-12.5 | 30 days supply | Qty: 30 | Fill #4

## 2018-01-22 MED FILL — GABAPENTIN 300 MG CAPSULE: 300 | 30 days supply | Qty: 30 | Fill #4

## 2018-02-22 MED FILL — GABAPENTIN 300 MG CAPSULE: 300 | 30 days supply | Qty: 30 | Fill #5

## 2018-02-22 MED FILL — LISINOPRIL-HCTZ 10-12.5 MG: 10-12.5 | 30 days supply | Qty: 30 | Fill #5

## 2018-02-22 MED FILL — ?OMEPRazole 20mg CPDR: 20 | 15 days supply | Qty: 30 | Fill #5

## 2018-03-17 ENCOUNTER — Ambulatory Visit: Payer: Self-pay | Attending: Family Medicine | Admitting: Physician Assistant

## 2018-03-17 VITALS — BP 154/90 | HR 64 | Temp 98.6°F | Resp 18 | Ht 60.0 in | Wt 146.0 lb

## 2018-03-17 DIAGNOSIS — G894 Chronic pain syndrome: Secondary | ICD-10-CM

## 2018-03-17 DIAGNOSIS — Z789 Other specified health status: Secondary | ICD-10-CM

## 2018-03-17 DIAGNOSIS — L309 Dermatitis, unspecified: Secondary | ICD-10-CM

## 2018-03-17 DIAGNOSIS — Z79899 Other long term (current) drug therapy: Secondary | ICD-10-CM | POA: Insufficient documentation

## 2018-03-17 DIAGNOSIS — Z86718 Personal history of other venous thrombosis and embolism: Secondary | ICD-10-CM | POA: Insufficient documentation

## 2018-03-17 DIAGNOSIS — M546 Pain in thoracic spine: Secondary | ICD-10-CM | POA: Insufficient documentation

## 2018-03-17 DIAGNOSIS — M549 Dorsalgia, unspecified: Secondary | ICD-10-CM

## 2018-03-17 DIAGNOSIS — I10 Essential (primary) hypertension: Secondary | ICD-10-CM

## 2018-03-17 DIAGNOSIS — K219 Gastro-esophageal reflux disease without esophagitis: Secondary | ICD-10-CM

## 2018-03-17 DIAGNOSIS — M62838 Other muscle spasm: Secondary | ICD-10-CM

## 2018-03-17 MED ORDER — LISINOPRIL-HYDROCHLOROTHIAZIDE 10-12.5 MG PO TABS: 1.0000 | ORAL_TABLET | Freq: Every day | ORAL | 5 refills | Status: DC

## 2018-03-17 MED ORDER — TRIAMCINOLONE ACETONIDE 0.1 % EX CREA: 1.0000 "application " | TOPICAL_CREAM | Freq: Two times a day (BID) | CUTANEOUS | 3 refills | Status: DC

## 2018-03-17 MED ORDER — METHOCARBAMOL 500 MG PO TABS: 500.0000 mg | ORAL_TABLET | Freq: Three times a day (TID) | ORAL | 1 refills | Status: DC | PRN

## 2018-03-17 MED ORDER — NAPROXEN 500 MG PO TABS: 500.0000 mg | ORAL_TABLET | Freq: Two times a day (BID) | ORAL | 1 refills | Status: DC

## 2018-03-17 MED ORDER — GABAPENTIN 300 MG PO CAPS: 300.0000 mg | ORAL_CAPSULE | Freq: Every day | ORAL | 5 refills | Status: DC

## 2018-03-17 MED ORDER — OMEPRAZOLE 20 MG PO CPDR: 20.0000 mg | DELAYED_RELEASE_CAPSULE | Freq: Two times a day (BID) | ORAL | 5 refills | Status: DC

## 2018-03-17 MED FILL — TRIAMCINOLONE 0.1% CREAM: 0.1 | 15 days supply | Qty: 30 | Fill #0

## 2018-03-17 MED FILL — METHOCARBAMOL 500 MG TABS: 500 | 30 days supply | Qty: 90 | Fill #0

## 2018-03-17 MED FILL — NAPROXEN 500 MG TABLET: 500 | 30 days supply | Qty: 60 | Fill #0

## 2018-03-17 NOTE — Progress Notes (Signed)
Patient ID: Ashley Jacobs, female   DOB: [birth year], 62 y.o.   MRN: [id number]     Ashley Jacobs, is a 62 y.o. female  [id number]  [id number]  DOB - [birth year]  Subjective:  Chief Complaint and HPI: Ashley Jacobs is a 62 y.o. female here for several issues. 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  +itching.  Present for 8-9 years.   Also soreness in upper back on and off.  Also occurs for years and waxes and wanes with intensity and depends on activity level.  NKI.  No paresthesias or weakness.    Needs RF on meds.  Compliant with meds.  No CP/dizziness/HA.    ROS:   Constitutional:  No f/c, No night sweats, No unexplained weight loss. EENT:  No vision changes, No blurry vision, No hearing changes. No mouth, throat, or ear problems.  Respiratory: No cough, No SOB Cardiac: No CP, no palpitations GI:  No abd pain, No N/V/D. GU: No Urinary s/sx Musculoskeletal: +upperback pain Neuro: No headache, no dizziness, no motor weakness.  Skin: No rash Endocrine:  No polydipsia. No polyuria.  Psych: Denies SI/HI  No problems updated.  ALLERGIES: No Known Allergies  PAST MEDICAL HISTORY: Past Medical History:  Diagnosis Date  . Anginal pain ([facility]) 05/30/2012  . Chronic pain    right hip  . Hypertension   . Leg DVT (deep venous thromboembolism), chronic ([facility]) 2009; 06/02/2012   "always left"  . Shortness of breath 05/30/2012  . Tuberculosis ~ 1980   "took medicine for it 6 months"    MEDICATIONS AT HOME: Prior to Admission medications   Medication Sig Start Date End Date Taking? Authorizing Provider  acetaminophen (TYLENOL) 325 MG tablet Take 650 mg by mouth every 6 (six) hours as needed.   Yes [provider]  gabapentin (NEURONTIN) 300 MG capsule Take 1 capsule (300 mg total) by mouth at bedtime. 03/17/18  Yes Georgian CoMcClung, Angela M, PA-C  hydrocortisone cream 0.5 % Apply 1 application  topically 2 (two) times daily. 09/24/17  Yes Hoy RegisterNewlin, Enobong, MD  lisinopril-hydrochlorothiazide (ZESTORETIC) 10-12.5 MG tablet Take 1 tablet by mouth daily. 03/17/18  Yes McClung, Angela M, PA-C  omeprazole (PRILOSEC) 20 MG capsule Take 1 capsule (20 mg total) by mouth 2 (two) times daily before a meal. 03/17/18  Yes McClung, Angela M, PA-C  methocarbamol (ROBAXIN) 500 MG tablet Take 1 tablet (500 mg total) by mouth every 8 (eight) hours as needed for muscle spasms. 03/17/18   Anders SimmondsMcClung, Angela M, PA-C  naproxen (NAPROSYN) 500 MG tablet Take 1 tablet (500 mg total) by mouth 2 (two) times daily with a meal. Prn pain 03/17/18   Anders SimmondsMcClung, Angela M, PA-C  triamcinolone cream (KENALOG) 0.1 % Apply 1 application topically 2 (two) times daily. Prn itching 03/17/18   Anders SimmondsMcClung, Angela M, PA-C     Objective:  EXAM:   Vitals:   03/17/18 1443  BP: (!) 154/90  Pulse: 64  Resp: 18  Temp: 98.6 F (37 C)  TempSrc: Oral  SpO2: 97%  Weight: 146 lb (66.2 kg)  Height: 5' (1.524 m)    General appearance : A&OX3. NAD. Non-toxic-appearing HEENT: Atraumatic and Normocephalic.  PERRLA. EOM intact.   Neck: supple, no JVD. No cervical lymphadenopathy. No thyromegaly Chest/Lungs:  Breathing-non-labored, Good air entry bilaterally, breath sounds normal without rales, rhonchi, or wheezing  CVS: S1 S2 regular, no murmurs, gallops, rubs  Trapezius spasm that is  TTP.  UEDTR=intact B. Extremities: Bilateral Lower Ext shows no edema, both legs are warm to touch with = pulse throughout Neurology:  CN II-XII grossly intact, Non focal.   Psych:  TP linear. J/I WNL. Normal speech. Appropriate eye contact and affect.  Skin:  Dry/eczematous skin in upper L arm  Data Review No results found for: HGBA1C   Assessment & Plan   1. Eczema, unspecified type - triamcinolone cream (KENALOG) 0.1 %; Apply 1 application topically 2 (two) times daily. Prn itching  Dispense: 30 g; Refill: 3  2. Upper back pain - naproxen (NAPROSYN) 500 MG  tablet; Take 1 tablet (500 mg total) by mouth 2 (two) times daily with a meal. Prn pain  Dispense: 60 tablet; Refill: 1  3. Muscle spasm - methocarbamol (ROBAXIN) 500 MG tablet; Take 1 tablet (500 mg total) by mouth every 8 (eight) hours as needed for muscle spasms.  Dispense: 90 tablet; Refill: 1  4. Essential hypertension Uncontrolled but out of meds.  Resume meds - lisinopril-hydrochlorothiazide (ZESTORETIC) 10-12.5 MG tablet; Take 1 tablet by mouth daily.  Dispense: 30 tablet; Refill: 5  5. Gastroesophageal reflux disease without esophagitis - omeprazole (PRILOSEC) 20 MG capsule; Take 1 capsule (20 mg total) by mouth 2 (two) times daily before a meal.  Dispense: 30 capsule; Refill: 5  6. Chronic pain syndrome - gabapentin (NEURONTIN) 300 MG capsule; Take 1 capsule (300 mg total) by mouth at bedtime.  Dispense: 30 capsule; Refill: 5  7. Language barrier stratus interpreters used and additional time performing visit was required.   Patient have been counseled extensively about nutrition and exercise  Return in about 3 months (around 06/17/2018) for Dr Alvis Lemmings; htn and GERD f/up.  The patient was given clear instructions to go to ER or return to medical center if symptoms don't improve, worsen or new problems develop. The patient verbalized understanding. The patient was told to call to get lab results if they haven't heard anything in the next week.     Georgian Co, PA-C [facility] [place], [place] [phone]   03/17/2018, 3:04 PM

## 2018-03-24 MED FILL — ?OMEPRazole 20mg CPDR: 20 | 15 days supply | Qty: 30 | Fill #0

## 2018-03-24 MED FILL — GABAPENTIN 300 MG CAPSULE: 300 | 30 days supply | Qty: 30 | Fill #0

## 2018-03-24 MED FILL — LISINOPRIL-HCTZ 10-12.5 MG: 10-12.5 | 30 days supply | Qty: 30 | Fill #0

## 2018-04-26 MED FILL — GABAPENTIN 300 MG CAPSULE: 300 | 30 days supply | Qty: 30 | Fill #1

## 2018-04-26 MED FILL — LISINOPRIL-HCTZ 10-12.5 MG: 10-12.5 | 30 days supply | Qty: 30 | Fill #1

## 2018-04-26 MED FILL — ?OMEPRazole 20mg CPDR: 20 | 15 days supply | Qty: 30 | Fill #1

## 2018-05-26 MED FILL — ?OMEPRazole 20mg CPDR: 20 | 15 days supply | Qty: 30 | Fill #2

## 2018-05-26 MED FILL — LISINOPRIL-HCTZ 10-12.5 MG: 10-12.5 | 30 days supply | Qty: 30 | Fill #2

## 2018-05-26 MED FILL — GABAPENTIN 300 MG CAPSULE: 300 | 30 days supply | Qty: 30 | Fill #2

## 2018-06-24 MED FILL — OMEPRAZOLE 20 MG CAP: 20 | 15 days supply | Qty: 30 | Fill #3

## 2018-06-24 MED FILL — GABAPENTIN 300 MG CAPSULE: 300 | 30 days supply | Qty: 30 | Fill #3

## 2018-06-24 MED FILL — LISINOPRIL-HCTZ 10-12.5 MG: 10-12.5 | 30 days supply | Qty: 30 | Fill #3

## 2018-07-27 MED FILL — GABAPENTIN 300 MG CAPSULE: 300 | 30 days supply | Qty: 30 | Fill #4

## 2018-07-27 MED FILL — OMEPRAZOLE 20 MG CAP: 20 | 15 days supply | Qty: 30 | Fill #4

## 2018-07-27 MED FILL — LISINOPRIL-HCTZ 10-12.5 MG: 10-12.5 | 30 days supply | Qty: 30 | Fill #4

## 2018-08-26 MED FILL — OMEPRAZOLE 20 MG CAP: 20 | 15 days supply | Qty: 30 | Fill #5

## 2018-08-26 MED FILL — GABAPENTIN 300 MG CAPSULE: 300 | 30 days supply | Qty: 30 | Fill #5

## 2018-08-26 MED FILL — LISINOPRIL-HCTZ 10-12.5 MG: 10-12.5 | 30 days supply | Qty: 30 | Fill #5

## 2018-09-28 ENCOUNTER — Other Ambulatory Visit: Payer: Self-pay | Admitting: Physician Assistant

## 2018-09-28 DIAGNOSIS — G894 Chronic pain syndrome: Secondary | ICD-10-CM

## 2018-09-28 DIAGNOSIS — K219 Gastro-esophageal reflux disease without esophagitis: Secondary | ICD-10-CM

## 2018-09-28 DIAGNOSIS — I10 Essential (primary) hypertension: Secondary | ICD-10-CM

## 2018-09-29 ENCOUNTER — Other Ambulatory Visit: Payer: Self-pay | Admitting: Physician Assistant

## 2018-09-29 DIAGNOSIS — I10 Essential (primary) hypertension: Secondary | ICD-10-CM

## 2018-09-29 DIAGNOSIS — K219 Gastro-esophageal reflux disease without esophagitis: Secondary | ICD-10-CM

## 2018-09-29 MED FILL — OMEPRAZOLE 20 MG CAP: 20 | 15 days supply | Qty: 30 | Fill #0

## 2018-09-29 MED FILL — LISINOPRIL-HCTZ 10-12.5 MG: 10-12.5 | 30 days supply | Qty: 30 | Fill #0

## 2018-09-30 MED FILL — GABAPENTIN 300 MG CAPSULE: 300 | 30 days supply | Qty: 30 | Fill #0

## 2018-10-06 ENCOUNTER — Other Ambulatory Visit: Payer: Self-pay | Admitting: Physician Assistant

## 2018-10-06 DIAGNOSIS — G894 Chronic pain syndrome: Secondary | ICD-10-CM

## 2018-11-02 ENCOUNTER — Other Ambulatory Visit: Payer: Self-pay | Admitting: Physician Assistant

## 2018-11-02 DIAGNOSIS — I10 Essential (primary) hypertension: Secondary | ICD-10-CM

## 2018-11-02 DIAGNOSIS — G894 Chronic pain syndrome: Secondary | ICD-10-CM

## 2018-11-08 MED FILL — ?OMEPRAZOLE 20 MG CAPSULE D: 20 | 15 days supply | Qty: 30 | Fill #1

## 2018-11-11 ENCOUNTER — Other Ambulatory Visit: Payer: Self-pay | Admitting: Family Medicine

## 2018-11-11 ENCOUNTER — Ambulatory Visit: Payer: Self-pay | Attending: Family Medicine | Admitting: Physician Assistant

## 2018-11-11 VITALS — BP 146/92 | HR 98 | Temp 97.6°F | Resp 16 | Ht 62.0 in | Wt 148.0 lb

## 2018-11-11 DIAGNOSIS — G894 Chronic pain syndrome: Secondary | ICD-10-CM

## 2018-11-11 DIAGNOSIS — I10 Essential (primary) hypertension: Secondary | ICD-10-CM

## 2018-11-11 DIAGNOSIS — Z789 Other specified health status: Secondary | ICD-10-CM

## 2018-11-11 DIAGNOSIS — Z13220 Encounter for screening for lipoid disorders: Secondary | ICD-10-CM

## 2018-11-11 DIAGNOSIS — K219 Gastro-esophageal reflux disease without esophagitis: Secondary | ICD-10-CM

## 2018-11-11 MED ORDER — GABAPENTIN 300 MG PO CAPS: 300.0000 mg | ORAL_CAPSULE | Freq: Every day | ORAL | 1 refills | Status: DC

## 2018-11-11 MED ORDER — LISINOPRIL-HYDROCHLOROTHIAZIDE 10-12.5 MG PO TABS: 1.0000 | ORAL_TABLET | Freq: Every day | ORAL | 1 refills | Status: DC

## 2018-11-11 MED ORDER — OMEPRAZOLE 20 MG PO CPDR: DELAYED_RELEASE_CAPSULE | ORAL | 1 refills | Status: DC

## 2018-11-11 MED FILL — LISINOPRIL-HCTZ 10-12.5 MG: 10-12.5 | 30 days supply | Qty: 30 | Fill #0

## 2018-11-11 MED FILL — GABAPENTIN 300 MG CAPSULE: 300 | 30 days supply | Qty: 30 | Fill #0

## 2018-11-11 NOTE — Progress Notes (Signed)
Patient ID: [name], female   DOB: 04/03/1956, 63 y.o.   MRN: [id number]       Ashley Jacobs, is a 63 y.o. female  [id number]  KTG:[id number]  DOB - Feb 06, 1956  Subjective:  Chief Complaint and HPI: Ashley Jacobs is a 64 y.o. female here today to get RF of medications.  No problems or concerns.  No CP/HA/dizziness.     Karren Burly with [employer] translating.   ROS:   Constitutional:  No f/c, No night sweats, No unexplained weight loss. EENT:  No vision changes, No blurry vision, No hearing changes. No mouth, throat, or ear problems.  Respiratory: No cough, No SOB Cardiac: No CP, no palpitations GI:  No abd pain, No N/V/D. GU: No Urinary s/sx Musculoskeletal: No joint pain Neuro: No headache, no dizziness, no motor weakness.  Skin: No rash Endocrine:  No polydipsia. No polyuria.  Psych: Denies SI/HI  No problems updated.  ALLERGIES: No Known Allergies  PAST MEDICAL HISTORY: Past Medical History:  Diagnosis Date  . Anginal pain ([facility]) 05/30/2012  . Chronic pain    right hip  . Hypertension   . Leg DVT (deep venous thromboembolism), chronic ([facility]) 2009; 06/02/2012   "always left"  . Shortness of breath 05/30/2012  . Tuberculosis ~ 1980   "took medicine for it 6 months"    MEDICATIONS AT HOME: Prior to Admission medications   Medication Sig Start Date End Date Taking? Authorizing Provider  gabapentin (NEURONTIN) 300 MG capsule Take 1 capsule (300 mg total) by mouth at bedtime. 11/11/18  Yes Jamir Rone M, PA-C  omeprazole (PRILOSEC) 20 MG capsule TAKE 1 CAPSULE BY MOUTH 2 TIMES DAILY BEFORE A MEAL. 11/11/18  Yes Anders Simmonds, PA-C  acetaminophen (TYLENOL) 325 MG tablet Take 650 mg by mouth every 6 (six) hours as needed.    [provider]  hydrocortisone cream 0.5 % Apply 1 application topically 2 (two) times daily. Patient not taking: Reported on 11/11/2018 09/24/17   Hoy Register, MD  lisinopril-hydrochlorothiazide  (PRINZIDE,ZESTORETIC) 10-12.5 MG tablet Take 1 tablet by mouth daily. 11/11/18   Anders Simmonds, PA-C  methocarbamol (ROBAXIN) 500 MG tablet Take 1 tablet (500 mg total) by mouth every 8 (eight) hours as needed for muscle spasms. Patient not taking: Reported on 11/11/2018 03/17/18   Anders Simmonds, PA-C  naproxen (NAPROSYN) 500 MG tablet Take 1 tablet (500 mg total) by mouth 2 (two) times daily with a meal. Prn pain Patient not taking: Reported on 11/11/2018 03/17/18   Anders Simmonds, PA-C  triamcinolone cream (KENALOG) 0.1 % Apply 1 application topically 2 (two) times daily. Prn itching Patient not taking: Reported on 11/11/2018 03/17/18   Anders Simmonds, PA-C     Objective:  EXAM:   Vitals:   11/11/18 1121  BP: (!) 146/92  Pulse: 98  Resp: 16  Temp: 97.6 F (36.4 C)  TempSrc: Oral  SpO2: 95%  Weight: 148 lb (67.1 kg)  Height: 5\' 2"  (1.575 m)    General appearance : A&OX3. NAD. Non-toxic-appearing HEENT: Atraumatic and Normocephalic.  PERRLA. EOM intact.  Chest/Lungs:  Breathing-non-labored, Good air entry bilaterally, breath sounds normal without rales, rhonchi, or wheezing  CVS: S1 S2 regular, no murmurs, gallops, rubs  Extremities: Bilateral Lower Ext shows no edema, both legs are warm to touch with = pulse throughout Neurology:  CN II-XII grossly intact, Non focal.   Psych:  TP linear. J/I WNL. Normal speech. Appropriate eye contact and affect.  Skin:  No  Rash  Data Review No results found for: HGBA1C   Assessment & Plan   1. Essential hypertension Controlled when not out of meds-hasn't taken meds today-resume meds - Comprehensive metabolic panel - CBC with Differential/Platelet - lisinopril-hydrochlorothiazide (PRINZIDE,ZESTORETIC) 10-12.5 MG tablet; Take 1 tablet by mouth daily.  Dispense: 90 tablet; Refill: 1  2. Language barrier stratus interpreters used and additional time performing visit was required.  3. Screening cholesterol level - Lipid panel  4.  Chronic pain syndrome stable - gabapentin (NEURONTIN) 300 MG capsule; Take 1 capsule (300 mg total) by mouth at bedtime.  Dispense: 90 capsule; Refill: 1  5. Gastroesophageal reflux disease without esophagitis stable - omeprazole (PRILOSEC) 20 MG capsule; TAKE 1 CAPSULE BY MOUTH 2 TIMES DAILY BEFORE A MEAL.  Dispense: 90 capsule; Refill: 1     Patient have been counseled extensively about nutrition and exercise  Return for Dr Alvis Lemmings for BP.  The patient was given clear instructions to go to ER or return to medical center if symptoms don't improve, worsen or new problems develop. The patient verbalized understanding. The patient was told to call to get lab results if they haven't heard anything in the next week.     Georgian Co, PA-C [facility] [place], [place] [phone]   11/11/2018, 11:29 AM

## 2018-11-12 LAB — COMPREHENSIVE METABOLIC PANEL
ALK PHOS: 91 IU/L (ref 39–117)
ALT: 26 IU/L (ref 0–32)
AST: 23 IU/L (ref 0–40)
Albumin/Globulin Ratio: 1.1 — ABNORMAL LOW (ref 1.2–2.2)
Albumin: 3.9 g/dL (ref 3.8–4.9)
BUN/Creatinine Ratio: 14 (ref 12–28)
BUN: 12 mg/dL (ref 8–27)
Bilirubin Total: 0.4 mg/dL (ref 0.0–1.2)
CO2: 26 mmol/L (ref 20–29)
Calcium: 9.1 mg/dL (ref 8.7–10.3)
Chloride: 105 mmol/L (ref 96–106)
Creatinine, Ser: 0.83 mg/dL (ref 0.57–1.00)
GFR calc Af Amer: 89 mL/min/{1.73_m2} (ref >59)
GFR calc non Af Amer: 77 mL/min/{1.73_m2} (ref >59)
Globulin, Total: 3.5 g/dL (ref 1.5–4.5)
Glucose: 103 mg/dL — ABNORMAL HIGH (ref 65–99)
Potassium: 3.8 mmol/L (ref 3.5–5.2)
Sodium: 145 mmol/L — ABNORMAL HIGH (ref 134–144)
Total Protein: 7.4 g/dL (ref 6.0–8.5)

## 2018-11-12 LAB — LIPID PANEL
Chol/HDL Ratio: 3.7 ratio (ref 0.0–4.4)
Cholesterol, Total: 144 mg/dL (ref 100–199)
HDL: 39 mg/dL — ABNORMAL LOW (ref >39)
LDL Calculated: 86 mg/dL (ref 0–99)
Triglycerides: 93 mg/dL (ref 0–149)
VLDL Cholesterol Cal: 19 mg/dL (ref 5–40)

## 2018-11-12 LAB — CBC WITH DIFFERENTIAL/PLATELET
Basophils Absolute: 0 10*3/uL (ref 0.0–0.2)
Basos: 0 %
EOS (ABSOLUTE): 0.2 10*3/uL (ref 0.0–0.4)
Eos: 3 %
Hematocrit: 36.3 % (ref 34.0–46.6)
Hemoglobin: 12.5 g/dL (ref 11.1–15.9)
Immature Grans (Abs): 0 10*3/uL (ref 0.0–0.1)
Immature Granulocytes: 0 %
Lymphocytes Absolute: 2.2 10*3/uL (ref 0.7–3.1)
Lymphs: 31 %
MCH: 30.9 pg (ref 26.6–33.0)
MCHC: 34.4 g/dL (ref 31.5–35.7)
MCV: 90 fL (ref 79–97)
Monocytes Absolute: 0.8 10*3/uL (ref 0.1–0.9)
Monocytes: 12 %
Neutrophils Absolute: 3.9 10*3/uL (ref 1.4–7.0)
Neutrophils: 54 %
Platelets: 222 10*3/uL (ref 150–450)
RBC: 4.05 x10E6/uL (ref 3.77–5.28)
RDW: 12.5 % (ref 11.7–15.4)
WBC: 7.2 10*3/uL (ref 3.4–10.8)

## 2018-11-30 ENCOUNTER — Telehealth: Payer: Self-pay

## 2018-11-30 NOTE — Telephone Encounter (Signed)
[employer] Id# [phone]   contacted pt to go over lab results pt didn't answer left a detailed vm informing pt of results and if she has any questions or concerns to give me a call

## 2018-12-13 MED FILL — GABAPENTIN 300 MG CAPSULE: 300 | 30 days supply | Qty: 30 | Fill #1

## 2018-12-13 MED FILL — LISINOPRIL-HCTZ 10-12.5 MG: 10-12.5 | 30 days supply | Qty: 30 | Fill #1

## 2018-12-13 MED FILL — ?OMEPRAZOLE 20 MG CAPSULE D: 20 | 15 days supply | Qty: 30 | Fill #2

## 2019-01-17 MED FILL — ?OMEPRAZOLE 20 MG CAPSULE D: 20 | 30 days supply | Qty: 60 | Fill #0

## 2019-01-17 MED FILL — LISINOPRIL-HCTZ 10-12.5 MG: 10-12.5 | 30 days supply | Qty: 30 | Fill #2

## 2019-01-17 MED FILL — GABAPENTIN 300 MG CAPSULE: 300 | 30 days supply | Qty: 30 | Fill #2

## 2019-02-08 MED FILL — OMEPRAZOLE 20 MG CAP: 20 | 30 days supply | Qty: 60 | Fill #1

## 2019-02-08 MED FILL — LISINOPRIL-HCTZ 10-12.5 TAB: 10-12.5 | 30 days supply | Qty: 30 | Fill #3

## 2019-03-14 MED FILL — GABAPENTIN 300 MG CAPSULE: 300 | 30 days supply | Qty: 30 | Fill #3

## 2019-03-14 MED FILL — LISINOPRIL-HCTZ 10-12.5 MG: 10-12.5 | 30 days supply | Qty: 30 | Fill #4

## 2019-03-14 MED FILL — OMEPRAZOLE 20 MG CAP: 20 | 30 days supply | Qty: 60 | Fill #2

## 2019-04-08 ENCOUNTER — Other Ambulatory Visit: Payer: Self-pay

## 2019-04-08 DIAGNOSIS — Z20822 Contact with and (suspected) exposure to covid-19: Secondary | ICD-10-CM

## 2019-04-11 LAB — NOVEL CORONAVIRUS, NAA: SARS-CoV-2, NAA: NOT DETECTED

## 2019-04-13 ENCOUNTER — Other Ambulatory Visit: Payer: Self-pay | Admitting: Physician Assistant

## 2019-04-13 DIAGNOSIS — K219 Gastro-esophageal reflux disease without esophagitis: Secondary | ICD-10-CM

## 2019-04-13 MED FILL — GABAPENTIN 300 MG CAPSULE: 300 | 30 days supply | Qty: 30 | Fill #4

## 2019-04-13 MED FILL — OMEPRAZOLE 20 MG CAP: 20 | 30 days supply | Qty: 30 | Fill #0

## 2019-04-13 MED FILL — LISINOPRIL-HCTZ 10-12.5 MG: 10-12.5 | 30 days supply | Qty: 30 | Fill #5

## 2019-04-26 MED FILL — OMEPRAZOLE 20 MG CAP: 20 | 30 days supply | Qty: 60 | Fill #0

## 2019-05-10 ENCOUNTER — Ambulatory Visit: Payer: Self-pay | Attending: Family Medicine | Admitting: Family Medicine

## 2019-05-10 ENCOUNTER — Encounter: Payer: Self-pay | Admitting: Family Medicine

## 2019-05-10 ENCOUNTER — Other Ambulatory Visit: Payer: Self-pay

## 2019-05-10 VITALS — BP 136/89 | HR 100 | Temp 98.2°F | Ht 62.0 in | Wt 146.0 lb

## 2019-05-10 DIAGNOSIS — I10 Essential (primary) hypertension: Secondary | ICD-10-CM

## 2019-05-10 DIAGNOSIS — Z1159 Encounter for screening for other viral diseases: Secondary | ICD-10-CM

## 2019-05-10 DIAGNOSIS — M62838 Other muscle spasm: Secondary | ICD-10-CM

## 2019-05-10 DIAGNOSIS — G894 Chronic pain syndrome: Secondary | ICD-10-CM

## 2019-05-10 MED ORDER — METHOCARBAMOL 500 MG PO TABS: 500.0000 mg | ORAL_TABLET | Freq: Three times a day (TID) | ORAL | 1 refills | Status: DC | PRN

## 2019-05-10 MED ORDER — LISINOPRIL-HYDROCHLOROTHIAZIDE 10-12.5 MG PO TABS: 1.0000 | ORAL_TABLET | Freq: Every day | ORAL | 1 refills | Status: DC

## 2019-05-10 MED ORDER — GABAPENTIN 300 MG PO CAPS: 300.0000 mg | ORAL_CAPSULE | Freq: Every day | ORAL | 1 refills | Status: DC

## 2019-05-10 MED FILL — LISINOPRIL-HCTZ 10-12.5 MG: 10-12.5 | 30 days supply | Qty: 30 | Fill #0

## 2019-05-10 MED FILL — METHOCARBAMOL 500 MG TABS: 500 | 30 days supply | Qty: 90 | Fill #0

## 2019-05-10 MED FILL — GABAPENTIN 300 MG CAPSULE: 300 | 30 days supply | Qty: 30 | Fill #0

## 2019-05-10 NOTE — Progress Notes (Signed)
Subjective:  Patient ID: Ashley Jacobs, female    DOB: [birth year]  Age: 63 y.o. MRN: [id number]  CC: Hypertension   HPI Ashley Jacobs is a 63 year old female with a history of hypertension, chronic pain who is accompanied today by a family member and seen with the aid of a video interpreter. She complains of pain in her left upper extremity and inferior to her left arm for the last couple weeks and on further questioning she does not have all her pain medications.  She has been compliant with gabapentin but ran out of muscle relaxant. Pain is more pronounced when she moves her left upper extremity but she denies numbness in extremity or reduced hand strength. Compliant with her antihypertensive and denies adverse effects from her medication. With regards to healthcare maintenance she is not up-to-date on her Pap smear, mammogram, colonoscopy.  Past Medical History:  Diagnosis Date  . Anginal pain ([place]) 05/30/2012  . Chronic pain    right hip  . Hypertension   . Leg DVT (deep venous thromboembolism), chronic ([place]) 2009; 06/02/2012   "always left"  . Shortness of breath 05/30/2012  . Tuberculosis ~ 1980   "took medicine for it 6 months"    Past Surgical History:  Procedure Laterality Date  . VENA CAVA FILTER PLACEMENT  06/02/12    History reviewed. No pertinent family history.  No Known Allergies  Outpatient Medications Prior to Visit  Medication Sig Dispense Refill  . omeprazole (PRILOSEC) 20 MG capsule TAKE 1 CAPSULE BY MOUTH 2 TIMES DAILY BEFORE A MEAL. 90 capsule 0  . gabapentin (NEURONTIN) 300 MG capsule Take 1 capsule (300 mg total) by mouth at bedtime. 90 capsule 1  . lisinopril-hydrochlorothiazide (PRINZIDE,ZESTORETIC) 10-12.5 MG tablet Take 1 tablet by mouth daily. 90 tablet 1  . acetaminophen (TYLENOL) 325 MG tablet Take 650 mg by mouth every 6 (six) hours as needed.    . hydrocortisone cream 0.5 % Apply 1 application topically 2 (two) times daily. (Patient not  taking: Reported on 11/11/2018) 30 g 1  . naproxen (NAPROSYN) 500 MG tablet Take 1 tablet (500 mg total) by mouth 2 (two) times daily with a meal. Prn pain (Patient not taking: Reported on 11/11/2018) 60 tablet 1  . triamcinolone cream (KENALOG) 0.1 % Apply 1 application topically 2 (two) times daily. Prn itching (Patient not taking: Reported on 11/11/2018) 30 g 3  . methocarbamol (ROBAXIN) 500 MG tablet Take 1 tablet (500 mg total) by mouth every 8 (eight) hours as needed for muscle spasms. (Patient not taking: Reported on 11/11/2018) 90 tablet 1   No facility-administered medications prior to visit.      ROS Review of Systems  Constitutional: Negative for activity change, appetite change and fatigue.  HENT: Negative for congestion, sinus pressure and sore throat.   Eyes: Negative for visual disturbance.  Respiratory: Negative for cough, chest tightness, shortness of breath and wheezing.   Cardiovascular: Negative for chest pain and palpitations.  Gastrointestinal: Negative for abdominal distention, abdominal pain and constipation.  Endocrine: Negative for polydipsia.  Genitourinary: Negative for dysuria and frequency.  Musculoskeletal:       See HPI  Skin: Negative for rash.  Neurological: Negative for tremors, light-headedness and numbness.  Hematological: Does not bruise/bleed easily.  Psychiatric/Behavioral: Negative for agitation and behavioral problems.    Objective:  BP 136/89   Pulse 100   Temp 98.2 F (36.8 C) (Oral)   Ht 5\' 2"  (1.575 m)   Wt 146 lb (66.2  kg)   SpO2 95%   BMI 26.70 kg/m   BP/Weight 05/10/2019 11/11/2018 03/17/2018  Systolic BP 136 146 154  Diastolic BP 89 92 90  Wt. (Lbs) 146 148 146  BMI 26.7 27.07 28.51      Physical Exam Constitutional:      Appearance: She is well-developed.  Cardiovascular:     Rate and Rhythm: Normal rate.     Heart sounds: Normal heart sounds. No murmur.  Pulmonary:     Effort: Pulmonary effort is normal.     Breath  sounds: Normal breath sounds. No wheezing or rales.  Chest:     Chest wall: No tenderness.  Abdominal:     General: Bowel sounds are normal. There is no distension.     Palpations: Abdomen is soft. There is no mass.     Tenderness: There is no abdominal tenderness.  Musculoskeletal: Normal range of motion.  Neurological:     Mental Status: She is alert and oriented to person, place, and time.     CMP Latest Ref Rng & Units 11/11/2018 09/24/2017 10/17/2016  Glucose 65 - 99 mg/dL 403(K) 86 86  BUN 8 - 27 mg/dL 12 13 12   Creatinine 0.57 - 1.00 mg/dL 7.42 5.95 6.38  Sodium 134 - 144 mmol/L 145(H) 142 137  Potassium 3.5 - 5.2 mmol/L 3.8 4.1 3.7  Chloride 96 - 106 mmol/L 105 100 103  CO2 20 - 29 mmol/L 26 25 27   Calcium 8.7 - 10.3 mg/dL 9.1 9.4 7.5(I)  Total Protein 6.0 - 8.5 g/dL 7.4 7.9 7.5  Total Bilirubin 0.0 - 1.2 mg/dL 0.4 0.5 0.9  Alkaline Phos 39 - 117 IU/L 91 91 64  AST 0 - 40 IU/L 23 26 24   ALT 0 - 32 IU/L 26 32 28    Lipid Panel     Component Value Date/Time   CHOL 144 11/11/2018 1138   TRIG 93 11/11/2018 1138   HDL 39 (L) 11/11/2018 1138   CHOLHDL 3.7 11/11/2018 1138   CHOLHDL 4.0 10/17/2016 0836   VLDL 24 11/16/2015 1051   LDLCALC 86 11/11/2018 1138    CBC    Component Value Date/Time   WBC 7.2 11/11/2018 1138   WBC 7.9 07/03/2014 1752   RBC 4.05 11/11/2018 1138   RBC 4.44 07/03/2014 1752   HGB 12.5 11/11/2018 1138   HCT 36.3 11/11/2018 1138   PLT 222 11/11/2018 1138   MCV 90 11/11/2018 1138   MCH 30.9 11/11/2018 1138   MCH 30.2 07/03/2014 1752   MCHC 34.4 11/11/2018 1138   MCHC 34.3 07/03/2014 1752   RDW 12.5 11/11/2018 1138   LYMPHSABS 2.2 11/11/2018 1138   MONOABS 0.5 06/08/2013 0139   EOSABS 0.2 11/11/2018 1138   BASOSABS 0.0 11/11/2018 1138    No results found for: HGBA1C  Assessment & Plan:   1. Essential hypertension Controlled Counseled on blood pressure goal of less than 130/80, low-sodium, DASH diet, medication compliance, 150 minutes  of moderate intensity exercise per week. Discussed medication compliance, adverse effects. - lisinopril-hydrochlorothiazide (ZESTORETIC) 10-12.5 MG tablet; Take 1 tablet by mouth daily.  Dispense: 90 tablet; Refill: 1 - Basic Metabolic Panel  2. Chronic pain syndrome Uncontrolled Cymbalta added to regimen - gabapentin (NEURONTIN) 300 MG capsule; Take 1 capsule (300 mg total) by mouth at bedtime.  Dispense: 90 capsule; Refill: 1  3. Muscle spasm Uncontrolled Refilled Robaxin - methocarbamol (ROBAXIN) 500 MG tablet; Take 1 tablet (500 mg total) by mouth every 8 (eight) hours as  needed for muscle spasms.  Dispense: 90 tablet; Refill: 1  4. Screening for viral disease - Hepatitis c antibody (reflex) - HIV Antibody (routine testing w rflx)   Health Care Maintenance: Return in 1 month for complete physical exam  Meds ordered this encounter  Medications  . lisinopril-hydrochlorothiazide (ZESTORETIC) 10-12.5 MG tablet    Sig: Take 1 tablet by mouth daily.    Dispense:  90 tablet    Refill:  1  . gabapentin (NEURONTIN) 300 MG capsule    Sig: Take 1 capsule (300 mg total) by mouth at bedtime.    Dispense:  90 capsule    Refill:  1  . methocarbamol (ROBAXIN) 500 MG tablet    Sig: Take 1 tablet (500 mg total) by mouth every 8 (eight) hours as needed for muscle spasms.    Dispense:  90 tablet    Refill:  1    Follow-up: Return in about 1 month (around 06/10/2019) for complete physical exam.       Hoy RegisterEnobong Danniella Robben, MD, FAAFP. [facility] [place], [place] [phone]   05/10/2019, 2:41 PM

## 2019-05-11 LAB — HCV COMMENT:

## 2019-05-11 LAB — BASIC METABOLIC PANEL
BUN/Creatinine Ratio: 11 — ABNORMAL LOW (ref 12–28)
BUN: 8 mg/dL (ref 8–27)
CO2: 24 mmol/L (ref 20–29)
Calcium: 9.1 mg/dL (ref 8.7–10.3)
Chloride: 99 mmol/L (ref 96–106)
Creatinine, Ser: 0.7 mg/dL (ref 0.57–1.00)
GFR calc Af Amer: 109 mL/min/{1.73_m2} (ref >59)
GFR calc non Af Amer: 94 mL/min/{1.73_m2} (ref >59)
Glucose: 88 mg/dL (ref 65–99)
Potassium: 4 mmol/L (ref 3.5–5.2)
Sodium: 137 mmol/L (ref 134–144)

## 2019-05-11 LAB — HIV ANTIBODY (ROUTINE TESTING W REFLEX): HIV Screen 4th Generation wRfx: NONREACTIVE

## 2019-05-11 LAB — HEPATITIS C ANTIBODY (REFLEX): HCV Ab: 0.1 {s_co_ratio} (ref 0.0–0.9)

## 2019-05-12 ENCOUNTER — Telehealth: Payer: Self-pay

## 2019-05-12 NOTE — Telephone Encounter (Signed)
-----   Message from Charlott Rakes, MD sent at 05/11/2019  1:31 PM EDT ----- Please inform the patient that labs are normal. Thank you.

## 2019-05-12 NOTE — Telephone Encounter (Signed)
Patient name and DOB has been verified Patient was informed of lab results. Patient had no questions. Interpreter:Chandra

## 2019-06-01 MED FILL — ?OMEPRAZOLE 20MG CAP DR: 20 | 15 days supply | Qty: 30 | Fill #1

## 2019-06-15 ENCOUNTER — Other Ambulatory Visit: Payer: Self-pay | Admitting: Family Medicine

## 2019-06-15 ENCOUNTER — Other Ambulatory Visit: Payer: Self-pay

## 2019-06-15 ENCOUNTER — Ambulatory Visit: Payer: Self-pay | Attending: Family Medicine | Admitting: Family Medicine

## 2019-06-15 ENCOUNTER — Encounter: Payer: Self-pay | Admitting: Family Medicine

## 2019-06-15 VITALS — BP 136/85 | HR 91 | Temp 98.2°F | Ht 62.0 in | Wt 144.4 lb

## 2019-06-15 DIAGNOSIS — Z124 Encounter for screening for malignant neoplasm of cervix: Secondary | ICD-10-CM

## 2019-06-15 DIAGNOSIS — Z1239 Encounter for other screening for malignant neoplasm of breast: Secondary | ICD-10-CM

## 2019-06-15 DIAGNOSIS — K219 Gastro-esophageal reflux disease without esophagitis: Secondary | ICD-10-CM

## 2019-06-15 DIAGNOSIS — Z0000 Encounter for general adult medical examination without abnormal findings: Secondary | ICD-10-CM

## 2019-06-15 DIAGNOSIS — Z1211 Encounter for screening for malignant neoplasm of colon: Secondary | ICD-10-CM

## 2019-06-15 MED ORDER — OMEPRAZOLE 20 MG PO CPDR: 20.0000 mg | DELAYED_RELEASE_CAPSULE | Freq: Two times a day (BID) | ORAL | 3 refills | Status: DC

## 2019-06-15 MED FILL — GABAPENTIN 300 MG CAPSULE: 300 | 30 days supply | Qty: 30 | Fill #1

## 2019-06-15 MED FILL — LISINOPRIL-HCTZ 10-12.5 MG: 10-12.5 | 30 days supply | Qty: 30 | Fill #1

## 2019-06-15 MED FILL — METHOCARBAMOL 500 MG TABS: 500 | 30 days supply | Qty: 90 | Fill #1

## 2019-06-15 MED FILL — ?OMEPRAZOLE 20MG CAP DR: 20 | 30 days supply | Qty: 60 | Fill #0

## 2019-06-15 NOTE — Progress Notes (Signed)
Subjective:  Patient ID: Ashley Jacobs, female    DOB: Jun 19, 1956  Age: 63 y.o. MRN: [id number]  CC: Annual Exam   HPI Ashley HANAHAN presents for an annual physical exam. She has never had a colonoscopy and does not recall having a mammogram or PAP smear. She is seen with the aid of a Nepali video interpreter and has no concerns today.  Past Medical History:  Diagnosis Date  . Anginal pain ([place]) 05/30/2012  . Chronic pain    right hip  . Hypertension   . Leg DVT (deep venous thromboembolism), chronic ([place]) 2009; 06/02/2012   "always left"  . Shortness of breath 05/30/2012  . Tuberculosis ~ 1980   "took medicine for it 6 months"    Past Surgical History:  Procedure Laterality Date  . VENA CAVA FILTER PLACEMENT  06/02/12    No family history on file.  No Known Allergies  Outpatient Medications Prior to Visit  Medication Sig Dispense Refill  . gabapentin (NEURONTIN) 300 MG capsule Take 1 capsule (300 mg total) by mouth at bedtime. 90 capsule 1  . lisinopril-hydrochlorothiazide (ZESTORETIC) 10-12.5 MG tablet Take 1 tablet by mouth daily. 90 tablet 1  . methocarbamol (ROBAXIN) 500 MG tablet Take 1 tablet (500 mg total) by mouth every 8 (eight) hours as needed for muscle spasms. 90 tablet 1  . omeprazole (PRILOSEC) 20 MG capsule TAKE 1 CAPSULE BY MOUTH 2 TIMES DAILY BEFORE A MEAL. 90 capsule 0  . acetaminophen (TYLENOL) 325 MG tablet Take 650 mg by mouth every 6 (six) hours as needed.    . hydrocortisone cream 0.5 % Apply 1 application topically 2 (two) times daily. (Patient not taking: Reported on 11/11/2018) 30 g 1  . naproxen (NAPROSYN) 500 MG tablet Take 1 tablet (500 mg total) by mouth 2 (two) times daily with a meal. Prn pain (Patient not taking: Reported on 11/11/2018) 60 tablet 1  . triamcinolone cream (KENALOG) 0.1 % Apply 1 application topically 2 (two) times daily. Prn itching (Patient not taking: Reported on 11/11/2018) 30 g 3   No facility-administered medications  prior to visit.      ROS Review of Systems  Constitutional: Negative for activity change, appetite change and fatigue.  HENT: Negative for congestion, sinus pressure and sore throat.   Eyes: Negative for visual disturbance.  Respiratory: Negative for cough, chest tightness, shortness of breath and wheezing.   Cardiovascular: Negative for chest pain and palpitations.  Gastrointestinal: Negative for abdominal distention, abdominal pain and constipation.  Endocrine: Negative for polydipsia.  Genitourinary: Negative for dysuria and frequency.  Musculoskeletal: Negative for arthralgias and back pain.  Skin: Negative for rash.  Neurological: Negative for tremors, light-headedness and numbness.  Hematological: Does not bruise/bleed easily.  Psychiatric/Behavioral: Negative for agitation and behavioral problems.    Objective:  BP 136/85   Pulse 91   Temp 98.2 F (36.8 C) (Oral)   Ht 5\' 2"  (1.575 m)   Wt 144 lb 6.4 oz (65.5 kg)   SpO2 98%   BMI 26.41 kg/m   BP/Weight 06/15/2019 05/10/2019 9/37/1696  Systolic BP 789 381 017  Diastolic BP 85 89 92  Wt. (Lbs) 144.4 146 148  BMI 26.41 26.7 27.07      Physical Exam Constitutional:      General: She is not in acute distress.    Appearance: She is well-developed. She is not diaphoretic.  HENT:     Head: Normocephalic.     Right Ear: External ear normal.  Left Ear: External ear normal.     Nose: Nose normal.  Eyes:     Conjunctiva/sclera: Conjunctivae normal.     Pupils: Pupils are equal, round, and reactive to light.  Neck:     Musculoskeletal: Normal range of motion.     Vascular: No JVD.  Cardiovascular:     Rate and Rhythm: Normal rate and regular rhythm.     Heart sounds: Normal heart sounds. No murmur. No gallop.   Pulmonary:     Effort: Pulmonary effort is normal. No respiratory distress.     Breath sounds: Normal breath sounds. No wheezing or rales.  Chest:     Chest wall: No tenderness.     Breasts:         Right: Normal. No mass.        Left: Normal. No mass.  Abdominal:     General: Bowel sounds are normal. There is no distension.     Palpations: Abdomen is soft. There is no mass.     Tenderness: There is no abdominal tenderness.  Musculoskeletal: Normal range of motion.        General: No tenderness.  Skin:    General: Skin is warm and dry.  Neurological:     Mental Status: She is alert and oriented to person, place, and time.     Deep Tendon Reflexes: Reflexes are normal and symmetric.     CMP Latest Ref Rng & Units 05/10/2019 11/11/2018 09/24/2017  Glucose 65 - 99 mg/dL 88 409(W103(H) 86  BUN 8 - 27 mg/dL 8 12 13   Creatinine 0.57 - 1.00 mg/dL 1.190.70 1.470.83 8.290.75  Sodium 134 - 144 mmol/L 137 145(H) 142  Potassium 3.5 - 5.2 mmol/L 4.0 3.8 4.1  Chloride 96 - 106 mmol/L 99 105 100  CO2 20 - 29 mmol/L 24 26 25   Calcium 8.7 - 10.3 mg/dL 9.1 9.1 9.4  Total Protein 6.0 - 8.5 g/dL - 7.4 7.9  Total Bilirubin 0.0 - 1.2 mg/dL - 0.4 0.5  Alkaline Phos 39 - 117 IU/L - 91 91  AST 0 - 40 IU/L - 23 26  ALT 0 - 32 IU/L - 26 32    Lipid Panel     Component Value Date/Time   CHOL 144 11/11/2018 1138   TRIG 93 11/11/2018 1138   HDL 39 (L) 11/11/2018 1138   CHOLHDL 3.7 11/11/2018 1138   CHOLHDL 4.0 10/17/2016 0836   VLDL 24 11/16/2015 1051   LDLCALC 86 11/11/2018 1138    CBC    Component Value Date/Time   WBC 7.2 11/11/2018 1138   WBC 7.9 07/03/2014 1752   RBC 4.05 11/11/2018 1138   RBC 4.44 07/03/2014 1752   HGB 12.5 11/11/2018 1138   HCT 36.3 11/11/2018 1138   PLT 222 11/11/2018 1138   MCV 90 11/11/2018 1138   MCH 30.9 11/11/2018 1138   MCH 30.2 07/03/2014 1752   MCHC 34.4 11/11/2018 1138   MCHC 34.3 07/03/2014 1752   RDW 12.5 11/11/2018 1138   LYMPHSABS 2.2 11/11/2018 1138   MONOABS 0.5 06/08/2013 0139   EOSABS 0.2 11/11/2018 1138   BASOSABS 0.0 11/11/2018 1138    No results found for: HGBA1C  Assessment & Plan:   1. Annual physical exam Counseled on 150 minutes of exercise  per week, healthy eating (including decreased daily intake of saturated fats, cholesterol, added sugars, sodium), STI prevention, routine healthcare maintenance.   2. Screening for breast cancer - MM DIGITAL SCREENING BILATERAL; Future  3. Screening  for cervical cancer - Cytology - PAP([place])  4. Screening for colon cancer - Fecal occult blood, imunochemical([facility])   No orders of the defined types were placed in this encounter.   Follow-up: No follow-ups on file.       Hoy Register, MD, FAAFP. [facility] [place], [place] [phone]   06/15/2019, 2:46 PM

## 2019-06-18 LAB — FECAL OCCULT BLOOD, IMMUNOCHEMICAL: Fecal Occult Bld: NEGATIVE

## 2019-06-20 LAB — CYTOLOGY - PAP
Adequacy: ABSENT
Diagnosis: NEGATIVE
High risk HPV: NEGATIVE

## 2019-06-23 ENCOUNTER — Telehealth: Payer: Self-pay

## 2019-06-23 NOTE — Telephone Encounter (Signed)
-----   Message from Charlott Rakes, MD sent at 06/20/2019  5:16 PM EDT ----- Pap smear is normal

## 2019-06-23 NOTE — Telephone Encounter (Signed)
Patient name and DOB has been verified Patient was informed of lab results. Patient had no questions.  

## 2019-07-18 ENCOUNTER — Other Ambulatory Visit: Payer: Self-pay | Admitting: Family Medicine

## 2019-07-18 DIAGNOSIS — M62838 Other muscle spasm: Secondary | ICD-10-CM

## 2019-07-18 MED FILL — LISINOPRIL-HCTZ 10-12.5 MG: 10-12.5 | 30 days supply | Qty: 30 | Fill #2

## 2019-07-18 MED FILL — GABAPENTIN 300 MG CAPSULE: 300 | 30 days supply | Qty: 30 | Fill #2

## 2019-07-18 MED FILL — ?OMEPRAZOLE 20MG CAP DR: 20 | 30 days supply | Qty: 60 | Fill #1

## 2019-07-19 MED FILL — METHOCARBAMOL 500 MG TABS: 500 | 30 days supply | Qty: 90 | Fill #0

## 2019-08-19 MED FILL — ?OMEPRAZOLE 20MG CAP DR: 20 | 30 days supply | Qty: 60 | Fill #2

## 2019-08-19 MED FILL — GABAPENTIN 300 MG CAPSULE: 300 | 30 days supply | Qty: 30 | Fill #3

## 2019-08-19 MED FILL — LISINOPRIL-HCTZ 10-12.5 MG: 10-12.5 | 30 days supply | Qty: 30 | Fill #3

## 2019-08-19 MED FILL — METHOCARBAMOL 500 MG TABS: 500 | 30 days supply | Qty: 90 | Fill #1

## 2019-09-20 ENCOUNTER — Other Ambulatory Visit: Payer: Self-pay | Admitting: Family Medicine

## 2019-09-20 DIAGNOSIS — M62838 Other muscle spasm: Secondary | ICD-10-CM

## 2019-09-20 MED FILL — LISINOPRIL-HCTZ 10-12.5 MG: 10-12.5 | 30 days supply | Qty: 30 | Fill #4

## 2019-09-20 MED FILL — GABAPENTIN 300 MG CAPSULE: 300 | 30 days supply | Qty: 30 | Fill #4

## 2019-09-20 MED FILL — ?OMEPRAZOLE 20MG CAP DR: 20 | 30 days supply | Qty: 60 | Fill #3

## 2019-09-21 MED FILL — METHOCARBAMOL 500 MG TABS: 500 | 30 days supply | Qty: 90 | Fill #0

## 2019-10-19 MED FILL — GABAPENTIN 300 MG CAPSULE: 300 | 30 days supply | Qty: 30 | Fill #5

## 2019-10-19 MED FILL — METHOCARBAMOL 500 MG TABS: 500 | 30 days supply | Qty: 90 | Fill #0

## 2019-10-19 MED FILL — LISINOPRIL-HCTZ 10-12.5 MG: 10-12.5 | 30 days supply | Qty: 30 | Fill #5

## 2019-10-19 MED FILL — ?OMEPRAZOLE 20MG CAP DR: 20 | 30 days supply | Qty: 60 | Fill #4

## 2019-11-18 ENCOUNTER — Other Ambulatory Visit: Payer: Self-pay | Admitting: Family Medicine

## 2019-11-18 DIAGNOSIS — I10 Essential (primary) hypertension: Secondary | ICD-10-CM

## 2019-11-18 DIAGNOSIS — M62838 Other muscle spasm: Secondary | ICD-10-CM

## 2019-11-18 DIAGNOSIS — G894 Chronic pain syndrome: Secondary | ICD-10-CM

## 2019-11-18 MED FILL — ?OMEPRAZOLE 20MG CAP DR: 20 | 30 days supply | Qty: 60 | Fill #5

## 2019-11-18 MED FILL — METHOCARBAMOL 500 MG TABS: 500 | 10 days supply | Qty: 30 | Fill #0

## 2019-11-18 MED FILL — LISINOPRIL-HCTZ 10-12.5 MG: 10-12.5 | 14 days supply | Qty: 14 | Fill #0

## 2019-11-21 MED FILL — GABAPENTIN 300 MG CAPSULE: 300 | 30 days supply | Qty: 30 | Fill #0

## 2019-11-30 ENCOUNTER — Other Ambulatory Visit: Payer: Self-pay

## 2019-11-30 ENCOUNTER — Encounter: Payer: Self-pay | Admitting: Family Medicine

## 2019-11-30 ENCOUNTER — Ambulatory Visit: Payer: Self-pay | Attending: Family Medicine | Admitting: Family Medicine

## 2019-11-30 VITALS — BP 115/79 | HR 88 | Ht 62.0 in | Wt 139.2 lb

## 2019-11-30 DIAGNOSIS — M62838 Other muscle spasm: Secondary | ICD-10-CM

## 2019-11-30 DIAGNOSIS — I10 Essential (primary) hypertension: Secondary | ICD-10-CM

## 2019-11-30 DIAGNOSIS — R519 Headache, unspecified: Secondary | ICD-10-CM

## 2019-11-30 DIAGNOSIS — K219 Gastro-esophageal reflux disease without esophagitis: Secondary | ICD-10-CM

## 2019-11-30 DIAGNOSIS — G894 Chronic pain syndrome: Secondary | ICD-10-CM

## 2019-11-30 MED ORDER — LISINOPRIL-HYDROCHLOROTHIAZIDE 10-12.5 MG PO TABS: 1.0000 | ORAL_TABLET | Freq: Every day | ORAL | 6 refills | Status: DC

## 2019-11-30 MED ORDER — METHOCARBAMOL 500 MG PO TABS: 500.0000 mg | ORAL_TABLET | Freq: Three times a day (TID) | ORAL | 2 refills | Status: DC | PRN

## 2019-11-30 MED ORDER — GABAPENTIN 300 MG PO CAPS: 300.0000 mg | ORAL_CAPSULE | Freq: Every day | ORAL | 6 refills | Status: DC

## 2019-11-30 MED ORDER — CETIRIZINE HCL 10 MG PO TABS: 10.0000 mg | ORAL_TABLET | Freq: Every day | ORAL | 1 refills | Status: DC

## 2019-11-30 MED ORDER — OMEPRAZOLE 20 MG PO CPDR: 20.0000 mg | DELAYED_RELEASE_CAPSULE | Freq: Two times a day (BID) | ORAL | 3 refills | Status: DC

## 2019-11-30 MED ORDER — BUTALBITAL-APAP-CAFFEINE 50-325-40 MG PO TABS: 1.0000 | ORAL_TABLET | Freq: Three times a day (TID) | ORAL | 0 refills | Status: AC | PRN

## 2019-11-30 NOTE — Progress Notes (Signed)
Subjective:  Patient ID: Ashley Jacobs, female    DOB: [birth year]  Age: 64 y.o. MRN: [id number]  CC: Hypertension   HPI LICET DUNPHY is a 64 year old female with a history of hypertension, GERD, chronic pain who is seen with the aid of a video interpreter. She is doing well on lisinopril/HCTZ and denies adverse effects from her medication. She has had headache, dizziness, dry mouth. Also has forehead pressure and burning sensation in her forehead.  She denies presence of tinnitus, syncope, dyspnea, chest pain, rhinorrhea, congestion, fever. She is on gabapentin and Robaxin for chronic pain which occurrs in her upper extremity and neuropathy in her feet. Reflux symptoms are controlled on her PPI.  Past Medical History:  Diagnosis Date  . Anginal pain ([facility]) 05/30/2012  . Chronic pain    right hip  . Hypertension   . Leg DVT (deep venous thromboembolism), chronic ([facility]) 2009; 06/02/2012   "always left"  . Shortness of breath 05/30/2012  . Tuberculosis ~ 1980   "took medicine for it 6 months"    Past Surgical History:  Procedure Laterality Date  . VENA CAVA FILTER PLACEMENT  06/02/12    No family history on file.  No Known Allergies  Outpatient Medications Prior to Visit  Medication Sig Dispense Refill  . acetaminophen (TYLENOL) 325 MG tablet Take 650 mg by mouth every 6 (six) hours as needed.    . gabapentin (NEURONTIN) 300 MG capsule TAKE 1 CAPSULE (300 MG TOTAL) BY MOUTH AT BEDTIME. 30 capsule 1  . lisinopril-hydrochlorothiazide (ZESTORETIC) 10-12.5 MG tablet TAKE 1 TABLET BY MOUTH DAILY. 14 tablet 0  . methocarbamol (ROBAXIN) 500 MG tablet TAKE 1 TABLET (500 MG TOTAL) BY MOUTH EVERY 8 (EIGHT) HOURS AS NEEDED FOR MUSCLE SPASMS. 30 tablet 0  . omeprazole (PRILOSEC) 20 MG capsule Take 1 capsule (20 mg total) by mouth 2 (two) times daily before a meal. 90 capsule 3  . hydrocortisone cream 0.5 % Apply 1 application topically 2 (two) times daily. (Patient not taking: Reported  on 11/11/2018) 30 g 1  . naproxen (NAPROSYN) 500 MG tablet Take 1 tablet (500 mg total) by mouth 2 (two) times daily with a meal. Prn pain (Patient not taking: Reported on 11/11/2018) 60 tablet 1  . triamcinolone cream (KENALOG) 0.1 % Apply 1 application topically 2 (two) times daily. Prn itching (Patient not taking: Reported on 11/11/2018) 30 g 3   No facility-administered medications prior to visit.     ROS Review of Systems  Constitutional: Negative for activity change, appetite change and fatigue.  HENT: Negative for congestion, sinus pressure and sore throat.   Eyes: Negative for visual disturbance.  Respiratory: Negative for cough, chest tightness, shortness of breath and wheezing.   Cardiovascular: Negative for chest pain and palpitations.  Gastrointestinal: Negative for abdominal distention, abdominal pain and constipation.  Endocrine: Negative for polydipsia.  Genitourinary: Negative for dysuria and frequency.  Musculoskeletal: Negative for arthralgias and back pain.  Skin: Negative for rash.  Neurological: Positive for dizziness and headaches. Negative for tremors, light-headedness and numbness.  Hematological: Does not bruise/bleed easily.  Psychiatric/Behavioral: Negative for agitation and behavioral problems.    Objective:  BP 115/79   Pulse 88   Ht 5\' 2"  (1.575 m)   Wt 139 lb 3.2 oz (63.1 kg)   SpO2 97%   BMI 25.46 kg/m   BP/Weight 11/30/2019 06/15/2019 05/10/2019  Systolic BP 115 136 136  Diastolic BP 79 85 89  Wt. (Lbs) 139.2 144.4 146  BMI 25.46 26.41 26.7      Physical Exam Constitutional:      Appearance: She is well-developed.  HENT:     Right Ear: Tympanic membrane normal.     Left Ear: Tympanic membrane normal.     Mouth/Throat:     Mouth: Mucous membranes are moist.  Neck:     Vascular: No JVD.  Cardiovascular:     Rate and Rhythm: Normal rate.     Heart sounds: Normal heart sounds. No murmur.  Pulmonary:     Effort: Pulmonary effort is  normal.     Breath sounds: Normal breath sounds. No wheezing or rales.  Chest:     Chest wall: No tenderness.  Abdominal:     General: Bowel sounds are normal. There is no distension.     Palpations: Abdomen is soft. There is no mass.     Tenderness: There is no abdominal tenderness.  Musculoskeletal:        General: Normal range of motion.     Right lower leg: No edema.     Left lower leg: No edema.  Neurological:     Mental Status: She is alert and oriented to person, place, and time.  Psychiatric:        Mood and Affect: Mood normal.     CMP Latest Ref Rng & Units 05/10/2019 11/11/2018 09/24/2017  Glucose 65 - 99 mg/dL 88 366(Y) 86  BUN 8 - 27 mg/dL 8 12 13   Creatinine 0.57 - 1.00 mg/dL 4.03 4.74  Sodium 134 - 144 mmol/L 137 145(H) 142  Potassium 3.5 - 5.2 mmol/L 4.0 3.8 4.1  Chloride 96 - 106 mmol/L 99 105 100  CO2 20 - 29 mmol/L 24 26 25   Calcium 8.7 - 10.3 mg/dL 9.1 9.1 9.4  Total Protein 6.0 - 8.5 g/dL - 7.4 7.9  Total Bilirubin 0.0 - 1.2 mg/dL - 0.4 0.5  Alkaline Phos 39 - 117 IU/L - 91 91  AST 0 - 40 IU/L - 23 26  ALT 0 - 32 IU/L - 26 32    Lipid Panel     Component Value Date/Time   CHOL 144 11/11/2018 1138   TRIG 93 11/11/2018 1138   HDL 39 (L) 11/11/2018 1138   CHOLHDL 3.7 11/11/2018 1138   CHOLHDL 4.0 10/17/2016 0836   VLDL 24 11/16/2015 1051   LDLCALC 86 11/11/2018 1138    CBC    Component Value Date/Time   WBC 7.2 11/11/2018 1138   WBC 7.9 07/03/2014 1752   RBC 4.05 11/11/2018 1138   RBC 4.44 07/03/2014 1752   HGB 12.5 11/11/2018 1138   HCT 36.3 11/11/2018 1138   PLT 222 11/11/2018 1138   MCV 90 11/11/2018 1138   MCH 30.9 11/11/2018 1138   MCH 30.2 07/03/2014 1752   MCHC 34.4 11/11/2018 1138   MCHC 34.3 07/03/2014 1752   RDW 12.5 11/11/2018 1138   LYMPHSABS 2.2 11/11/2018 1138   MONOABS 0.5 06/08/2013 0139   EOSABS 0.2 11/11/2018 1138   BASOSABS 0.0 11/11/2018 1138    No results found for: HGBA1C  Assessment & Plan:  1. Chronic  pain syndrome Stable - gabapentin (NEURONTIN) 300 MG capsule; Take 1 capsule (300 mg total) by mouth at bedtime.  Dispense: 30 capsule; Refill: 6  2. Essential hypertension Controlled - Basic Metabolic Panel - lisinopril-hydrochlorothiazide (ZESTORETIC) 10-12.5 MG tablet; Take 1 tablet by mouth daily.  Dispense: 30 tablet; Refill: 6  3. Muscle spasm Stable - methocarbamol (ROBAXIN) 500 MG  tablet; Take 1 tablet (500 mg total) by mouth every 8 (eight) hours as needed for muscle spasms.  Dispense: 60 tablet; Refill: 2  4. Gastroesophageal reflux disease without esophagitis Controlled - omeprazole (PRILOSEC) 20 MG capsule; Take 1 capsule (20 mg total) by mouth 2 (two) times daily before a meal.  Dispense: 90 capsule; Refill: 3  5. Sinus headache Placed on Fioricet and Zyrtec Sinus symptoms could also explain this.      Charlott Rakes, MD, FAAFP. [facility] [place], [place]   11/30/2019, 4:29 PM

## 2019-11-30 NOTE — Patient Instructions (Signed)
Sinus Headache  A sinus headache happens when your sinuses get swollen or blocked (clogged). Sinuses are spaces behind the bones of your face and forehead. You may feel pain or pressure in your face, forehead, ears, or upper teeth. Sinus headaches can be mild or very bad. Follow these instructions at home: General instructions  If told: ? Apply a warm, moist washcloth to your face. This can help to lessen pain. ? Use a nasal saline wash. Follow the directions on the bottle or box. Medicines   Take over-the-counter and prescription medicines only as told by your doctor.  If you were prescribed an antibiotic medicine, take it as told by your doctor. Do not stop taking it even if you start to feel better.  Use a nose spray if your nose feels full of mucus (congested). Hydrate and humidify  Drink enough water to keep your pee (urine) pale yellow.  Use a cool mist humidifier to keep the humidity level in your home above 50%.  Breathe in steam for 10-15 minutes, 3-4 times a day or as told by your doctor. You can do this in the bathroom while a hot shower is running.  Try not to spend time in cool or dry air. Contact a doctor if:  You get more than one headache a week.  Light or sound bothers you.  You have a fever.  You feel sick to your stomach (nauseous) or you throw up (vomit).  Your headaches do not get better with treatment. Get help right away if:  You have trouble seeing.  You suddenly have very bad pain in your face or head.  You start to have quick, sudden movements or shaking that you cannot control (seizure).  You are confused.  You have a stiff neck. Summary  A sinus headache happens when your sinuses get swollen or blocked (clogged). Sinuses are spaces behind the bones of your face and forehead.  You may feel pain or pressure in your face, forehead, ears, or upper teeth.  Take over-the-counter and prescription medicines only as told by your doctor.  If  told, apply a warm, moist washcloth to your face. This can help to lessen pain. This information is not intended to replace advice given to you by your health care provider. Make sure you discuss any questions you have with your health care provider. Document Revised: 08/14/2017 Document Reviewed: 06/12/2017 Elsevier Patient Education  2020 [employer].  

## 2019-11-30 NOTE — Progress Notes (Signed)
Headaches and dizziness, patient states that she has dry mouth at times.

## 2019-12-01 LAB — BASIC METABOLIC PANEL
BUN/Creatinine Ratio: 11 — ABNORMAL LOW (ref 12–28)
BUN: 8 mg/dL (ref 8–27)
CO2: 25 mmol/L (ref 20–29)
Calcium: 9.1 mg/dL (ref 8.7–10.3)
Chloride: 103 mmol/L (ref 96–106)
Creatinine, Ser: 0.74 mg/dL (ref 0.57–1.00)
GFR calc Af Amer: 101 mL/min/{1.73_m2} (ref >59)
GFR calc non Af Amer: 88 mL/min/{1.73_m2} (ref >59)
Glucose: 89 mg/dL (ref 65–99)
Potassium: 3.7 mmol/L (ref 3.5–5.2)
Sodium: 140 mmol/L (ref 134–144)

## 2019-12-01 MED FILL — METHOCARBAMOL 500 MG TABS: 500 | 20 days supply | Qty: 60 | Fill #0

## 2019-12-01 MED FILL — CETIRIZINE HCL 10 MG TABLET: 10 | 30 days supply | Qty: 30 | Fill #0

## 2019-12-01 MED FILL — BUTALB-ACETAMIN-CAFF 50-325: 50-325-40 | 10 days supply | Qty: 30 | Fill #0

## 2019-12-01 MED FILL — GABAPENTIN 300 MG CAPSULE: 300 | 30 days supply | Qty: 30 | Fill #0

## 2019-12-21 MED FILL — ?OMEPRAZOLE 20 MG CAP DR: 20 | 30 days supply | Qty: 60 | Fill #0

## 2019-12-21 MED FILL — LISINOPRIL-HCTZ 10-12.5 MG: 10-12.5 | 30 days supply | Qty: 30 | Fill #0

## 2020-01-17 MED FILL — ?OMEPRAZOLE 20 MG CAP DR: 20 | 30 days supply | Qty: 60 | Fill #1

## 2020-01-17 MED FILL — ?CETIRIZINE HCL 10 MG TABLE: 10 | 30 days supply | Qty: 30 | Fill #1

## 2020-01-17 MED FILL — GABAPENTIN 300 MG CAPSULE: 300 | 30 days supply | Qty: 30 | Fill #1

## 2020-01-17 MED FILL — LISINOPRIL-HCTZ 10-12.5 MG: 10-12.5 | 30 days supply | Qty: 30 | Fill #1

## 2020-01-17 MED FILL — METHOCARBAMOL 500 MG TABS: 500 | 20 days supply | Qty: 60 | Fill #1

## 2020-02-16 MED FILL — ?OMEPRAZOLE 20 MG CAP DR: 20 | 30 days supply | Qty: 60 | Fill #2

## 2020-02-16 MED FILL — GABAPENTIN 300 MG CAPSULE: 300 | 30 days supply | Qty: 30 | Fill #2

## 2020-02-16 MED FILL — LISINOPRIL-HCTZ 10-12.5 MG: 10-12.5 | 30 days supply | Qty: 30 | Fill #2

## 2020-03-20 MED FILL — GABAPENTIN 300 MG CAPSULE: 300 | 30 days supply | Qty: 30 | Fill #3

## 2020-03-20 MED FILL — ?OMEPRAZOLE 20 MG CPDR: 20 | 30 days supply | Qty: 60 | Fill #3

## 2020-03-20 MED FILL — LISINOPRIL-HCTZ 10-12.5 MG: 10-12.5 | 30 days supply | Qty: 30 | Fill #3

## 2020-04-17 MED FILL — ?OMEPRAZOLE 20 MG CPDR: 20 | 30 days supply | Qty: 60 | Fill #4

## 2020-04-17 MED FILL — LISINOPRIL-HCTZ 10-12.5 MG: 10-12.5 | 30 days supply | Qty: 30 | Fill #4

## 2020-04-17 MED FILL — GABAPENTIN 300 MG CAPSULE: 300 | 30 days supply | Qty: 30 | Fill #4

## 2020-05-18 MED FILL — GABAPENTIN 300 MG CAPSULE: 300 | 30 days supply | Qty: 30 | Fill #5

## 2020-05-18 MED FILL — LISINOPRIL-HCTZ 10-12.5 MG: 10-12.5 | 30 days supply | Qty: 30 | Fill #5

## 2020-05-18 MED FILL — ?OMEPRAZOLE 20 MG CPDR: 20 | 30 days supply | Qty: 60 | Fill #5

## 2020-06-15 ENCOUNTER — Other Ambulatory Visit: Payer: Self-pay | Admitting: Family Medicine

## 2020-06-15 DIAGNOSIS — K219 Gastro-esophageal reflux disease without esophagitis: Secondary | ICD-10-CM

## 2020-06-15 MED FILL — OMEPRAZOLE 20 MG CAP: 20 | 30 days supply | Qty: 60 | Fill #0

## 2020-06-15 MED FILL — GABAPENTIN 300 MG CAPSULE: 300 | 30 days supply | Qty: 30 | Fill #6

## 2020-06-15 MED FILL — METHOCARBAMOL 500 MG TABS: 500 | 20 days supply | Qty: 60 | Fill #2

## 2020-06-15 NOTE — Telephone Encounter (Signed)
Requested Prescriptions  Pending Prescriptions Disp Refills  . omeprazole (PRILOSEC) 20 MG capsule [Pharmacy Med Name: OMEPRAZOLE 20 MG CPDR 20 Capsule] 60 capsule 3    Sig: TAKE 1 CAPSULE (20 MG TOTAL) BY MOUTH 2 (TWO) TIMES DAILY BEFORE A MEAL.     Gastroenterology: Proton Pump Inhibitors Passed - 06/15/2020 10:43 AM      Passed - Valid encounter within last 12 months    Recent Outpatient Visits          6 months ago Sinus headache   [facility] Hoy Register, [facility]   1 year ago Annual physical exam   [facility] Hoy Register, MD   1 year ago Screening for viral disease   [facility] Hoy Register, MD   1 year ago Essential hypertension   [facility] [place], [place], [place]   2 years ago Eczema, unspecified type   [facility] [place], [place], [place]

## 2020-06-19 MED FILL — LISINOPRIL-HCTZ 10-12.5 MG: 10-12.5 | 30 days supply | Qty: 30 | Fill #6

## 2020-06-25 ENCOUNTER — Ambulatory Visit: Payer: Self-pay | Attending: Family Medicine | Admitting: Family Medicine

## 2020-06-25 ENCOUNTER — Other Ambulatory Visit: Payer: Self-pay

## 2020-07-23 ENCOUNTER — Other Ambulatory Visit: Payer: Self-pay | Admitting: Family Medicine

## 2020-07-23 DIAGNOSIS — I10 Essential (primary) hypertension: Secondary | ICD-10-CM

## 2020-07-23 MED FILL — LISINOPRIL-HCTZ 10-12.5 MG: 10-12.5 | 30 days supply | Qty: 30 | Fill #0

## 2020-07-23 MED FILL — OMEPRAZOLE 20 MG CAP: 20 | 30 days supply | Qty: 60 | Fill #1

## 2020-07-23 MED FILL — GABAPENTIN 300 MG CAPSULE: 300 | 30 days supply | Qty: 30 | Fill #0

## 2020-07-23 NOTE — Telephone Encounter (Signed)
Requested Prescriptions  Pending Prescriptions Disp Refills   lisinopril-hydrochlorothiazide (ZESTORETIC) 10-12.5 MG tablet [Pharmacy Med Name: LISINOPRIL-HCTZ 10-12.5 MG 10-12.5 Tablet] 30 tablet 0    Sig: TAKE 1 TABLET BY MOUTH DAILY.     Cardiovascular:  ACEI + Diuretic Combos Failed - 07/23/2020 10:52 AM      Failed - Na in normal range and within 180 days    Sodium  Date Value Ref Range Status  11/30/2019 140 134 - 144 mmol/L Final         Failed - K in normal range and within 180 days    Potassium  Date Value Ref Range Status  11/30/2019 3.7 3.5 - 5.2 mmol/L Final         Failed - Cr in normal range and within 180 days    Creat  Date Value Ref Range Status  10/17/2016 0.76 0.50 - 1.05 mg/dL Final    Comment:      For patients > or = 62 years of age: The upper reference limit for Creatinine is approximately 13% higher for people identified as African-American.      Creatinine, Ser  Date Value Ref Range Status  11/30/2019 0.74 0.57 - 1.00 mg/dL Final         Failed - Ca in normal range and within 180 days    Calcium  Date Value Ref Range Status  11/30/2019 9.1 8.7 - 10.3 mg/dL Final   Calcium, Ion  Date Value Ref Range Status  01/06/2014 1.23 1.12 - 1.23 mmol/L Final         Passed - Patient is not pregnant      Passed - Last BP in normal range    BP Readings from Last 1 Encounters:  11/30/19 115/79         Passed - Valid encounter within last 6 months    Recent Outpatient Visits          7 months ago Sinus headache   [facility] Hoy Register, MD   1 year ago Annual physical exam   [facility] Hoy Register, MD   1 year ago Screening for viral disease   [facility] Hoy Register, MD   1 year ago Essential hypertension   [facility] [place], [place], [place]   2 years ago Eczema, unspecified type   [facility] [place], [place], [place]              Attempted to call pt. With asst. [employer]; ID # [id number].  Unable to reach pt.; left vm to call [phone] to schedule f/u appt. With PCP.  Courtesy refill # 30 given at this time.

## 2020-08-22 ENCOUNTER — Other Ambulatory Visit: Payer: Self-pay | Admitting: Family Medicine

## 2020-08-22 DIAGNOSIS — I10 Essential (primary) hypertension: Secondary | ICD-10-CM

## 2020-08-22 MED FILL — GABAPENTIN 300 MG CAPSULE: 300 | 30 days supply | Qty: 30 | Fill #1

## 2020-08-22 MED FILL — LISINOPRIL-HCTZ 10-12.5 MG: 10-12.5 | 30 days supply | Qty: 30 | Fill #0

## 2020-08-22 MED FILL — OMEPRAZOLE 20 MG CAP: 20 | 30 days supply | Qty: 60 | Fill #2

## 2020-09-20 ENCOUNTER — Other Ambulatory Visit: Payer: Self-pay | Admitting: Family Medicine

## 2020-09-20 DIAGNOSIS — I10 Essential (primary) hypertension: Secondary | ICD-10-CM

## 2020-09-20 DIAGNOSIS — G894 Chronic pain syndrome: Secondary | ICD-10-CM

## 2020-09-20 MED FILL — OMEPRAZOLE 20 MG CAP: 20 | 30 days supply | Qty: 60 | Fill #3

## 2020-09-20 NOTE — Telephone Encounter (Signed)
Requested medications are due for refill today yes  Requested medications are on the active medication list yes  Last refill 12/8  Last visit 11/2019  Future visit scheduled No  Notes to clinic Failed protocol due to no valid visit within 6  months. No upcoming appt scheduled.

## 2020-10-11 ENCOUNTER — Other Ambulatory Visit: Payer: Self-pay | Admitting: Physician Assistant

## 2020-10-11 ENCOUNTER — Other Ambulatory Visit: Payer: Self-pay

## 2020-10-11 ENCOUNTER — Ambulatory Visit (INDEPENDENT_AMBULATORY_CARE_PROVIDER_SITE_OTHER): Payer: Self-pay | Admitting: Physician Assistant

## 2020-10-11 VITALS — BP 154/92 | HR 90 | Temp 98.6°F | Resp 18 | Ht 59.5 in | Wt 140.0 lb

## 2020-10-11 DIAGNOSIS — K219 Gastro-esophageal reflux disease without esophagitis: Secondary | ICD-10-CM

## 2020-10-11 DIAGNOSIS — I10 Essential (primary) hypertension: Secondary | ICD-10-CM

## 2020-10-11 DIAGNOSIS — G894 Chronic pain syndrome: Secondary | ICD-10-CM

## 2020-10-11 DIAGNOSIS — G44209 Tension-type headache, unspecified, not intractable: Secondary | ICD-10-CM

## 2020-10-11 DIAGNOSIS — G4709 Other insomnia: Secondary | ICD-10-CM

## 2020-10-11 DIAGNOSIS — E559 Vitamin D deficiency, unspecified: Secondary | ICD-10-CM

## 2020-10-11 MED ORDER — OMEPRAZOLE 20 MG PO CPDR: 20.0000 mg | DELAYED_RELEASE_CAPSULE | Freq: Two times a day (BID) | ORAL | 3 refills | Status: DC

## 2020-10-11 MED ORDER — LISINOPRIL-HYDROCHLOROTHIAZIDE 10-12.5 MG PO TABS
1.0000 | ORAL_TABLET | Freq: Every day | ORAL | 0 refills | Status: DC
Start: 2020-10-11 — End: 2020-11-21

## 2020-10-11 MED ORDER — GABAPENTIN 300 MG PO CAPS: 300.0000 mg | ORAL_CAPSULE | Freq: Every day | ORAL | 6 refills | Status: DC

## 2020-10-11 NOTE — Patient Instructions (Signed)
I encourage you to work on improving your sleep. You should be sleeping 7 to 8 hours a night. I encourage you to try melatonin to help. You can purchase this over-the-counter.  Roney Jaffe, PA-C Physician Assistant [facility] [email or web address]

## 2020-10-11 NOTE — Progress Notes (Signed)
Established Patient Office Visit  Subjective:  Patient ID: Ashley Jacobs, female    DOB: [birth year]  Age: 65 y.o. MRN: [id number]  CC:  Chief Complaint  Patient presents with  . Headache    HPI ALIRAH LITT presents for medication refills.  States that she has been out of medication for the past 6 days.    States that she has been having headaches, states that they occur a couple times a week and will go outside or use tylenol with relief.  States that she is hard time falling asleep; states it can take 3-4 hours to fall asleep.  States that she is only getting 3-4 hours of sleep a night.  States that she will not nap during the day. Has not tried anything for relief  States that she does not drink much water, maybe one bottle   Due to language barrier, an interpreter was present during the history-taking and subsequent discussion (and for part of the physical exam) with this patient.    Past Medical History:  Diagnosis Date  . Anginal pain (HCC) 05/30/2012  . Chronic pain    right hip  . Hypertension   . Leg DVT (deep venous thromboembolism), chronic (HCC) 2009; 06/02/2012   "always left"  . Shortness of breath 05/30/2012  . Tuberculosis ~ 1980   "took medicine for it 6 months"    Past Surgical History:  Procedure Laterality Date  . VENA CAVA FILTER PLACEMENT  06/02/12    History reviewed. No pertinent family history.  Social History   Socioeconomic History  . Marital status: Married    Spouse name: Not on file  . Number of children: Not on file  . Years of education: Not on file  . Highest education level: Not on file  Occupational History  . Not on file  Tobacco Use  . Smoking status: Never Smoker  . Smokeless tobacco: Never Used  Substance and Sexual Activity  . Alcohol use: No  . Drug use: No  . Sexual activity: Never  Other Topics Concern  . Not on file  Social History Narrative  . Not on file   Social Determinants of Health   Financial  Resource Strain: Not on file  Food Insecurity: Not on file  Transportation Needs: Not on file  Physical Activity: Not on file  Stress: Not on file  Social Connections: Not on file  Intimate Partner Violence: Not on file    Outpatient Medications Prior to Visit  Medication Sig Dispense Refill  . acetaminophen (TYLENOL) 325 MG tablet Take 650 mg by mouth every 6 (six) hours as needed.    . butalbital-acetaminophen-caffeine (FIORICET) 50-325-40 MG tablet Take 1 tablet by mouth every 8 (eight) hours as needed for headache. 30 tablet 0  . cetirizine (ZYRTEC) 10 MG tablet Take 1 tablet (10 mg total) by mouth daily. 30 tablet 1  . hydrocortisone cream 0.5 % Apply 1 application topically 2 (two) times daily. (Patient not taking: Reported on 11/11/2018) 30 g 1  . methocarbamol (ROBAXIN) 500 MG tablet Take 1 tablet (500 mg total) by mouth every 8 (eight) hours as needed for muscle spasms. 60 tablet 2  . naproxen (NAPROSYN) 500 MG tablet Take 1 tablet (500 mg total) by mouth 2 (two) times daily with a meal. Prn pain (Patient not taking: Reported on 11/11/2018) 60 tablet 1  . triamcinolone cream (KENALOG) 0.1 % Apply 1 application topically 2 (two) times daily. Prn itching (Patient not taking: Reported on  11/11/2018) 30 g 3  . gabapentin (NEURONTIN) 300 MG capsule Take 1 capsule (300 mg total) by mouth at bedtime. 30 capsule 6  . lisinopril-hydrochlorothiazide (ZESTORETIC) 10-12.5 MG tablet TAKE 1 TABLET BY MOUTH DAILY. 30 tablet 0  . omeprazole (PRILOSEC) 20 MG capsule TAKE 1 CAPSULE (20 MG TOTAL) BY MOUTH 2 (TWO) TIMES DAILY BEFORE A MEAL. 60 capsule 3   No facility-administered medications prior to visit.    No Known Allergies  ROS Review of Systems  Constitutional: Negative.   HENT: Negative.   Eyes: Negative.   Respiratory: Negative.   Cardiovascular: Negative.   Gastrointestinal: Negative.   Endocrine: Negative.   Genitourinary: Negative.   Musculoskeletal: Negative.   Skin: Negative.    Allergic/Immunologic: Negative.   Neurological: Positive for headaches. Negative for dizziness, speech difficulty and weakness.  Hematological: Negative.   Psychiatric/Behavioral: Positive for sleep disturbance. Negative for dysphoric mood, self-injury and suicidal ideas. The patient is not nervous/anxious.       Objective:    Physical Exam Vitals and nursing note reviewed.   BP (!) 154/92 (BP Location: Left Arm, Patient Position: Sitting, Cuff Size: Normal)   Pulse 90   Temp 98.6 F (37 C) (Temporal)   Resp 18   Ht 4' 11.5" (1.511 m)   Wt 140 lb (63.5 kg)   SpO2 100%   BMI 27.80 kg/m   General Appearance:    Alert, cooperative, no distress, appears stated age  Head:    Normocephalic, without obvious abnormality, atraumatic  Eyes:    PERRL, conjunctiva/corneas clear, EOM's intact, fundi    benign, both eyes  Ears:    Normal TM's and external ear canals, both ears  Nose:   Nares normal, septum midline, mucosa normal, no drainage    or sinus tenderness  Throat:   Lips, mucosa, and tongue normal; teeth and gums normal  Neck:   Supple, symmetrical, trachea midline, no adenopathy;    thyroid:  no enlargement/tenderness/nodules; no carotid   bruit or JVD  Back:     Symmetric, no curvature, ROM normal, no CVA tenderness  Lungs:     Clear to auscultation bilaterally, respirations unlabored  Chest Wall:    No tenderness or deformity   Heart:    Regular rate and rhythm, S1 and S2 normal, no murmur, rub   or gallop              Extremities:   Extremities normal, atraumatic, no cyanosis or edema  Pulses:   2+ and symmetric all extremities  Skin:   Skin color, texture, turgor normal, no rashes or lesions  Lymph nodes:   Cervical, supraclavicular, and axillary nodes normal  Neurologic:   CNII-XII intact, normal strength, sensation and reflexes    throughout     BP (!) 154/92 (BP Location: Left Arm, Patient Position: Sitting, Cuff Size: Normal)   Pulse 90   Temp 98.6 F (37  C) (Temporal)   Resp 18   Ht 4' 11.5" (1.511 m)   Wt 140 lb (63.5 kg)   SpO2 100%   BMI 27.80 kg/m  Wt Readings from Last 3 Encounters:  10/11/20 140 lb (63.5 kg)  11/30/19 139 lb 3.2 oz (63.1 kg)  06/15/19 144 lb 6.4 oz (65.5 kg)     Health Maintenance Due  Topic Date Due  . MAMMOGRAM  Never done  . INFLUENZA VACCINE  04/15/2020  . COLON CANCER SCREENING ANNUAL FOBT  06/16/2020    There are no preventive care reminders to  display for this patient.  Lab Results  Component Value Date   TSH 0.365 04/03/2010   Lab Results  Component Value Date   WBC 7.2 11/11/2018   HGB 12.5 11/11/2018   HCT 36.3 11/11/2018   MCV 90 11/11/2018   PLT 222 11/11/2018   Lab Results  Component Value Date   NA 140 11/30/2019   K 3.7 11/30/2019   CO2 25 11/30/2019   GLUCOSE 89 11/30/2019   BUN 8 11/30/2019   CREATININE 0.74 11/30/2019   BILITOT 0.4 11/11/2018   ALKPHOS 91 11/11/2018   AST 23 11/11/2018   ALT 26 11/11/2018   PROT 7.4 11/11/2018   ALBUMIN 3.9 11/11/2018   CALCIUM 9.1 11/30/2019   ANIONGAP 11 07/03/2014   Lab Results  Component Value Date   CHOL 144 11/11/2018   Lab Results  Component Value Date   HDL 39 (L) 11/11/2018   Lab Results  Component Value Date   LDLCALC 86 11/11/2018   Lab Results  Component Value Date   TRIG 93 11/11/2018   Lab Results  Component Value Date   CHOLHDL 3.7 11/11/2018   No results found for: HGBA1C    Assessment & Plan:   Problem List Items Addressed This Visit      Cardiovascular and Mediastinum   Essential hypertension   Relevant Medications   lisinopril-hydrochlorothiazide (ZESTORETIC) 10-12.5 MG tablet   Other Relevant Orders   CBC with Differential/Platelet   Comp. Metabolic Panel (12)   Lipid panel   TSH     Digestive   Gastroesophageal reflux disease without esophagitis   Relevant Medications   omeprazole (PRILOSEC) 20 MG capsule     Other   Vitamin D deficiency   Relevant Orders   Vitamin D 1,25  dihydroxy   Chronic pain syndrome   Relevant Medications   gabapentin (NEURONTIN) 300 MG capsule   Other insomnia - Primary   Tension-type headache, not intractable   Relevant Medications   gabapentin (NEURONTIN) 300 MG capsule    1. Essential hypertension Refills provided.  Patient to return for fasting labs.  Encouraged follow-up with primary care provider in March 2022  - CBC with Differential/Platelet; Future - Comp. Metabolic Panel (12); Future - Lipid panel; Future - TSH; Future - lisinopril-hydrochlorothiazide (ZESTORETIC) 10-12.5 MG tablet; Take 1 tablet by mouth daily.  Dispense: 30 tablet; Refill: 0  2. Chronic pain syndrome Continue current regimen - gabapentin (NEURONTIN) 300 MG capsule; Take 1 capsule (300 mg total) by mouth at bedtime.  Dispense: 30 capsule; Refill: 6  3. Gastroesophageal reflux disease without esophagitis Continue current regimen - omeprazole (PRILOSEC) 20 MG capsule; Take 1 capsule (20 mg total) by mouth 2 (two) times daily before a meal.  Dispense: 60 capsule; Refill: 3  4. Other insomnia Trial melatonin over-the-counter  5. Tension-type headache, not intractable, unspecified chronicity pattern Encourage patient to increase hydration  6. Vitamin D deficiency  - Vitamin D 1,25 dihydroxy; Future  Meds ordered this encounter  Medications  . lisinopril-hydrochlorothiazide (ZESTORETIC) 10-12.5 MG tablet    Sig: Take 1 tablet by mouth daily.    Dispense:  30 tablet    Refill:  0    Please advise patient to contact office for appts    Order Specific Question:   Supervising Provider    Answer:   Storm Frisk [[id number]]  . gabapentin (NEURONTIN) 300 MG capsule    Sig: Take 1 capsule (300 mg total) by mouth at bedtime.    Dispense:  30  capsule    Refill:  6    Order Specific Question:   Supervising Provider    Answer:   Storm Frisk [[id number]]  . omeprazole (PRILOSEC) 20 MG capsule    Sig: Take 1 capsule (20 mg total) by mouth 2 (two)  times daily before a meal.    Dispense:  60 capsule    Refill:  3    Order Specific Question:   Supervising Provider    Answer:   Storm Frisk [[id number]]   I have reviewed the patient's medical history (PMH, PSH, Social History, Family History, Medications, and allergies) , and have been updated if relevant. I spent 30 minutes reviewing chart and  face to face time with patient.     Follow-up: No follow-ups on file.    Kasandra Knudsen Mayers, PA-C

## 2020-10-12 MED FILL — LISINOPRIL-HCTZ 10-12.5 MG: 10-12.5 | 30 days supply | Qty: 30 | Fill #0

## 2020-10-12 MED FILL — GABAPENTIN 300 MG CAPSULE: 300 | 30 days supply | Qty: 30 | Fill #0

## 2020-10-13 ENCOUNTER — Encounter: Payer: Self-pay | Admitting: Physician Assistant

## 2020-10-13 DIAGNOSIS — G4709 Other insomnia: Secondary | ICD-10-CM | POA: Insufficient documentation

## 2020-10-13 DIAGNOSIS — G894 Chronic pain syndrome: Secondary | ICD-10-CM | POA: Insufficient documentation

## 2020-10-13 DIAGNOSIS — G44209 Tension-type headache, unspecified, not intractable: Secondary | ICD-10-CM | POA: Insufficient documentation

## 2020-10-13 DIAGNOSIS — K219 Gastro-esophageal reflux disease without esophagitis: Secondary | ICD-10-CM | POA: Insufficient documentation

## 2020-10-15 ENCOUNTER — Other Ambulatory Visit: Payer: Self-pay

## 2020-10-15 DIAGNOSIS — E559 Vitamin D deficiency, unspecified: Secondary | ICD-10-CM

## 2020-10-15 DIAGNOSIS — I10 Essential (primary) hypertension: Secondary | ICD-10-CM

## 2020-10-22 LAB — LIPID PANEL
Chol/HDL Ratio: 2.9 ratio (ref 0.0–4.4)
Cholesterol, Total: 163 mg/dL (ref 100–199)
HDL: 57 mg/dL (ref >39)
LDL Chol Calc (NIH): 89 mg/dL (ref 0–99)
Triglycerides: 89 mg/dL (ref 0–149)
VLDL Cholesterol Cal: 17 mg/dL (ref 5–40)

## 2020-10-22 LAB — COMP. METABOLIC PANEL (12)
AST: 22 IU/L (ref 0–40)
Albumin/Globulin Ratio: 1 — ABNORMAL LOW (ref 1.2–2.2)
Albumin: 3.9 g/dL (ref 3.8–4.8)
Alkaline Phosphatase: 107 IU/L (ref 44–121)
BUN/Creatinine Ratio: 12 (ref 12–28)
BUN: 9 mg/dL (ref 8–27)
Bilirubin Total: 0.6 mg/dL (ref 0.0–1.2)
Calcium: 9.2 mg/dL (ref 8.7–10.3)
Chloride: 105 mmol/L (ref 96–106)
Creatinine, Ser: 0.76 mg/dL (ref 0.57–1.00)
GFR calc Af Amer: 97 mL/min/{1.73_m2} (ref >59)
GFR calc non Af Amer: 84 mL/min/{1.73_m2} (ref >59)
Globulin, Total: 3.9 g/dL (ref 1.5–4.5)
Glucose: 96 mg/dL (ref 65–99)
Potassium: 3.9 mmol/L (ref 3.5–5.2)
Sodium: 142 mmol/L (ref 134–144)
Total Protein: 7.8 g/dL (ref 6.0–8.5)

## 2020-10-22 LAB — CBC WITH DIFFERENTIAL/PLATELET
Basophils Absolute: 0 10*3/uL (ref 0.0–0.2)
Basos: 1 %
EOS (ABSOLUTE): 0.1 10*3/uL (ref 0.0–0.4)
Eos: 2 %
Hematocrit: 39.1 % (ref 34.0–46.6)
Hemoglobin: 12.9 g/dL (ref 11.1–15.9)
Immature Grans (Abs): 0 10*3/uL (ref 0.0–0.1)
Immature Granulocytes: 0 %
Lymphocytes Absolute: 1.5 10*3/uL (ref 0.7–3.1)
Lymphs: 27 %
MCH: 30.4 pg (ref 26.6–33.0)
MCHC: 33 g/dL (ref 31.5–35.7)
MCV: 92 fL (ref 79–97)
Monocytes Absolute: 0.4 10*3/uL (ref 0.1–0.9)
Monocytes: 7 %
Neutrophils Absolute: 3.6 10*3/uL (ref 1.4–7.0)
Neutrophils: 63 %
Platelets: 236 10*3/uL (ref 150–450)
RBC: 4.24 x10E6/uL (ref 3.77–5.28)
RDW: 13.6 % (ref 11.7–15.4)
WBC: 5.6 10*3/uL (ref 3.4–10.8)

## 2020-10-22 LAB — TSH: TSH: 0.721 u[IU]/mL (ref 0.450–4.500)

## 2020-10-22 LAB — VITAMIN D 1,25 DIHYDROXY
Vitamin D 1, 25 (OH)2 Total: 36 pg/mL
Vitamin D2 1, 25 (OH)2: <10 pg/mL
Vitamin D3 1, 25 (OH)2: 35 pg/mL

## 2020-11-06 ENCOUNTER — Telehealth: Payer: Self-pay | Admitting: *Deleted

## 2020-11-06 NOTE — Telephone Encounter (Signed)
-----   Message from Roney Jaffe, [place] sent at 10/23/2020  1:29 PM EST ----- Please call patient and let her know that her thyroid, kidney and liver function were within normal limits.  Her vitamin D was within normal limits, and she does not show signs of anemia.  Her cholesterol is very well controlled.  No changes need to be made at this time

## 2020-11-06 NOTE — Telephone Encounter (Signed)
Medical Assistant used [employer] to contact patient.  Interpreter Name: Vallery Ridge Interpreter #: [phone] Patient verified DOB Patients son is aware of labs showing all normal results and no changes needed at this time.

## 2020-11-20 ENCOUNTER — Other Ambulatory Visit: Payer: Self-pay | Admitting: Physician Assistant

## 2020-11-20 DIAGNOSIS — I10 Essential (primary) hypertension: Secondary | ICD-10-CM

## 2020-11-20 MED FILL — OMEPRAZOLE 20 MG CAP: 20 | 30 days supply | Qty: 60 | Fill #0

## 2020-11-21 ENCOUNTER — Other Ambulatory Visit: Payer: Self-pay | Admitting: Family Medicine

## 2020-11-22 MED FILL — LISINOPRIL-HCTZ 10-12.5 MG: 10-12.5 | 30 days supply | Qty: 30 | Fill #0

## 2020-12-06 ENCOUNTER — Ambulatory Visit: Payer: Self-pay | Admitting: Family Medicine

## 2020-12-27 ENCOUNTER — Other Ambulatory Visit: Payer: Self-pay

## 2020-12-27 ENCOUNTER — Telehealth: Payer: Self-pay | Admitting: Family Medicine

## 2020-12-27 DIAGNOSIS — I10 Essential (primary) hypertension: Secondary | ICD-10-CM

## 2020-12-27 MED FILL — Gabapentin Cap 300 MG: ORAL | 30 days supply | Qty: 30 | Fill #0 | Status: AC

## 2020-12-27 MED FILL — Omeprazole Cap Delayed Release 20 MG: ORAL | 30 days supply | Qty: 60 | Fill #0 | Status: AC

## 2020-12-27 NOTE — Telephone Encounter (Signed)
Requested medications are due for refill today yes  Requested medications are on the active medication list yes  Last refill 11/22/20  Last visit 11/2019  Future visit scheduled no, had an appt and pt cancelled  Notes to clinic Has already had a curtesy refill and there is no upcoming appointment scheduled.

## 2021-01-08 ENCOUNTER — Other Ambulatory Visit: Payer: Self-pay

## 2021-01-08 MED ORDER — LISINOPRIL-HYDROCHLOROTHIAZIDE 10-12.5 MG PO TABS
1.0000 | ORAL_TABLET | Freq: Every day | ORAL | 1 refills | Status: DC
  Filled 2021-01-08: qty 30, 30d supply, fill #0

## 2021-01-08 NOTE — Telephone Encounter (Signed)
Patient has just scheduled an appt. For 6/27 which was the earliest appt.  Patient is all out of her BP meds and needs a courtesy refill until her appt.  Please advise and call patient to discuss at [phone]

## 2021-01-08 NOTE — Telephone Encounter (Signed)
Rx sent 

## 2021-01-08 NOTE — Addendum Note (Signed)
Addended by: Lois Huxley, Jeannett Senior L on: 01/08/2021 11:42 AM   Modules accepted: Orders

## 2021-01-09 ENCOUNTER — Other Ambulatory Visit: Payer: Self-pay

## 2021-01-10 ENCOUNTER — Other Ambulatory Visit: Payer: Self-pay

## 2021-01-10 ENCOUNTER — Ambulatory Visit: Payer: Self-pay | Admitting: Physician Assistant

## 2021-01-10 VITALS — BP 119/81 | HR 95 | Temp 98.2°F | Resp 18 | Ht 65.0 in | Wt 141.0 lb

## 2021-01-10 DIAGNOSIS — Z1211 Encounter for screening for malignant neoplasm of colon: Secondary | ICD-10-CM

## 2021-01-10 DIAGNOSIS — E559 Vitamin D deficiency, unspecified: Secondary | ICD-10-CM

## 2021-01-10 DIAGNOSIS — G894 Chronic pain syndrome: Secondary | ICD-10-CM

## 2021-01-10 DIAGNOSIS — K219 Gastro-esophageal reflux disease without esophagitis: Secondary | ICD-10-CM

## 2021-01-10 DIAGNOSIS — I10 Essential (primary) hypertension: Secondary | ICD-10-CM

## 2021-01-10 DIAGNOSIS — Z1231 Encounter for screening mammogram for malignant neoplasm of breast: Secondary | ICD-10-CM

## 2021-01-10 MED ORDER — LISINOPRIL-HYDROCHLOROTHIAZIDE 10-12.5 MG PO TABS: 1.0000 | ORAL_TABLET | Freq: Every day | ORAL | 1 refills | Status: DC

## 2021-01-10 NOTE — Progress Notes (Signed)
Established Patient Office Visit  Subjective:  Patient ID: Ashley Jacobs, female    DOB: [birth year]  Age: 65 y.o. MRN: [id number]  CC:  Chief Complaint  Patient presents with  . Hypertension    HPI NORMALEE SISTARE request refills of her medications, states that she has been out of her blood pressure medication for the last 3 days.  Daughter is present and does help with history.  Reports that she does not check her blood pressure at home, does endorse a slight headache today.  Reports that this is a common occurrence, states that she has worked on increasing her water intake since her last office visit and is now drinking approximately 4 cups of water a day.  Reports that this does seem to offer some relief from her headaches.  States that she also will go outside and take a walk with relief of headaches.  Reports that she has been using melatonin on occasion with relief of her insomnia.  States on the nights that she uses melatonin she will sleep approximately 5 to 6 hours.   Due to language barrier, an interpreter was present during the history-taking and subsequent discussion (and for part of the physical exam) with this patient.   Past Medical History:  Diagnosis Date  . Anginal pain (HCC) 05/30/2012  . Chronic pain    right hip  . Hypertension   . Leg DVT (deep venous thromboembolism), chronic (HCC) 2009; 06/02/2012   "always left"  . Shortness of breath 05/30/2012  . Tuberculosis ~ 1980   "took medicine for it 6 months"    Past Surgical History:  Procedure Laterality Date  . VENA CAVA FILTER PLACEMENT  06/02/12    No family history on file.  Social History   Socioeconomic History  . Marital status: Married    Spouse name: Not on file  . Number of children: Not on file  . Years of education: Not on file  . Highest education level: Not on file  Occupational History  . Not on file  Tobacco Use  . Smoking status: Never Smoker  . Smokeless tobacco: Never Used   Substance and Sexual Activity  . Alcohol use: No  . Drug use: No  . Sexual activity: Never  Other Topics Concern  . Not on file  Social History Narrative  . Not on file   Social Determinants of Health   Financial Resource Strain: Not on file  Food Insecurity: Not on file  Transportation Needs: Not on file  Physical Activity: Not on file  Stress: Not on file  Social Connections: Not on file  Intimate Partner Violence: Not on file    Outpatient Medications Prior to Visit  Medication Sig Dispense Refill  . acetaminophen (TYLENOL) 325 MG tablet Take 650 mg by mouth every 6 (six) hours as needed.    . cetirizine (ZYRTEC) 10 MG tablet Take 1 tablet (10 mg total) by mouth daily. 30 tablet 1  . gabapentin (NEURONTIN) 300 MG capsule TAKE 1 CAPSULE (300 MG TOTAL) BY MOUTH AT BEDTIME. 30 capsule 6  . methocarbamol (ROBAXIN) 500 MG tablet Take 1 tablet (500 mg total) by mouth every 8 (eight) hours as needed for muscle spasms. 60 tablet 2  . omeprazole (PRILOSEC) 20 MG capsule TAKE 1 CAPSULE (20 MG TOTAL) BY MOUTH 2 (TWO) TIMES DAILY BEFORE A MEAL. 60 capsule 3  . lisinopril-hydrochlorothiazide (ZESTORETIC) 10-12.5 MG tablet TAKE 1 TABLET BY MOUTH DAILY. 30 tablet 1  . hydrocortisone cream  0.5 % Apply 1 application topically 2 (two) times daily. (Patient not taking: No sig reported) 30 g 1  . naproxen (NAPROSYN) 500 MG tablet Take 1 tablet (500 mg total) by mouth 2 (two) times daily with a meal. Prn pain (Patient not taking: No sig reported) 60 tablet 1  . triamcinolone cream (KENALOG) 0.1 % Apply 1 application topically 2 (two) times daily. Prn itching (Patient not taking: No sig reported) 30 g 3   No facility-administered medications prior to visit.    No Known Allergies  ROS Review of Systems  Constitutional: Negative for chills and fever.  HENT: Negative.   Eyes: Negative.   Respiratory: Negative for shortness of breath.   Cardiovascular: Negative for chest pain.   Gastrointestinal: Negative.   Endocrine: Negative.   Genitourinary: Negative.   Musculoskeletal: Positive for arthralgias.  Skin: Negative.   Allergic/Immunologic: Negative.   Neurological: Positive for headaches. Negative for speech difficulty and weakness.  Hematological: Negative.   Psychiatric/Behavioral: Positive for sleep disturbance. Negative for self-injury and suicidal ideas. The patient is not nervous/anxious.       Objective:    Physical Exam Vitals and nursing note reviewed.  Constitutional:      Appearance: Normal appearance.  HENT:     Head: Normocephalic and atraumatic.     Right Ear: External ear normal.     Left Ear: External ear normal.     Nose: Nose normal.     Mouth/Throat:     Mouth: Mucous membranes are moist.     Pharynx: Oropharynx is clear.  Eyes:     Extraocular Movements: Extraocular movements intact.     Conjunctiva/sclera: Conjunctivae normal.     Pupils: Pupils are equal, round, and reactive to light.  Cardiovascular:     Rate and Rhythm: Normal rate and regular rhythm.  Pulmonary:     Effort: Pulmonary effort is normal.     Breath sounds: Normal breath sounds.  Musculoskeletal:        General: Normal range of motion.     Cervical back: Normal range of motion and neck supple.  Skin:    General: Skin is warm and dry.  Neurological:     General: No focal deficit present.     Mental Status: She is alert and oriented to person, place, and time.  Psychiatric:        Mood and Affect: Mood normal.        Behavior: Behavior normal.        Thought Content: Thought content normal.        Judgment: Judgment normal.     BP 119/81 (BP Location: Left Arm, Patient Position: Sitting, Cuff Size: Normal)   Pulse 95   Temp 98.2 F (36.8 C) (Oral)   Resp 18   Ht 5\' 5"  (1.651 m)   Wt 141 lb (64 kg)   SpO2 97%   BMI 23.46 kg/m  Wt Readings from Last 3 Encounters:  01/10/21 141 lb (64 kg)  10/11/20 140 lb (63.5 kg)  11/30/19 139 lb 3.2 oz  (63.1 kg)     Health Maintenance Due  Topic Date Due  . MAMMOGRAM  Never done  . COLON CANCER SCREENING ANNUAL FOBT  06/16/2020    There are no preventive care reminders to display for this patient.  Lab Results  Component Value Date   TSH 0.721 10/15/2020   Lab Results  Component Value Date   WBC 5.6 10/15/2020   HGB 12.9 10/15/2020   HCT  39.1 10/15/2020   MCV 92 10/15/2020   PLT 236 10/15/2020   Lab Results  Component Value Date   NA 142 10/15/2020   K 3.9 10/15/2020   CO2 25 11/30/2019   GLUCOSE 96 10/15/2020   BUN 9 10/15/2020   CREATININE 0.76 10/15/2020   BILITOT 0.6 10/15/2020   ALKPHOS 107 10/15/2020   AST 22 10/15/2020   ALT 26 11/11/2018   PROT 7.8 10/15/2020   ALBUMIN 3.9 10/15/2020   CALCIUM 9.2 10/15/2020   ANIONGAP 11 07/03/2014   Lab Results  Component Value Date   CHOL 163 10/15/2020   Lab Results  Component Value Date   HDL 57 10/15/2020   Lab Results  Component Value Date   LDLCALC 89 10/15/2020   Lab Results  Component Value Date   TRIG 89 10/15/2020   Lab Results  Component Value Date   CHOLHDL 2.9 10/15/2020   No results found for: HGBA1C    Assessment & Plan:   Problem List Items Addressed This Visit      Cardiovascular and Mediastinum   Essential hypertension - Primary   Relevant Medications   lisinopril-hydrochlorothiazide (ZESTORETIC) 10-12.5 MG tablet     Digestive   Gastroesophageal reflux disease without esophagitis     Other   Vitamin D deficiency   Chronic pain syndrome    Other Visit Diagnoses    Screen for colon cancer       Relevant Orders   Ambulatory referral to Gastroenterology   Encounter for screening mammogram for malignant neoplasm of breast       Relevant Orders   MM DIGITAL SCREENING BILATERAL      Meds ordered this encounter  Medications  . lisinopril-hydrochlorothiazide (ZESTORETIC) 10-12.5 MG tablet    Sig: TAKE 1 TABLET BY MOUTH DAILY.    Dispense:  30 tablet    Refill:  1     Order Specific Question:   Supervising Provider    Answer:   WRIGHT, PATRICK E [[id number]]   1. Essential hypertension Resume current regimen, patient encouraged to check blood pressure at home on a daily basis, keep a written log and have available for all office visits.  Encouraged to continue increasing hydration.  Patient given appointment [facility].  Patient understands and agrees. - lisinopril-hydrochlorothiazide (ZESTORETIC) 10-12.5 MG tablet; TAKE 1 TABLET BY MOUTH DAILY.  Dispense: 30 tablet; Refill: 1  2. Chronic pain syndrome Continue current regimen, no refills needed today  3. Gastroesophageal reflux disease without esophagitis Continue current regimen, no refills needed today  4. Screen for colon cancer  - Ambulatory referral to Gastroenterology  5. Encounter for screening mammogram for malignant neoplasm of breast  - MM DIGITAL SCREENING BILATERAL; Future    I have reviewed the patient's medical history (PMH, PSH, Social History, Family History, Medications, and allergies) , and have been updated if relevant. I spent 34 minutes reviewing chart and  face to face time with patient.     Follow-up: Return in about 6 weeks (around 02/20/2021) for [facility].    Kasandra Knudsen Mayers, PA-C

## 2021-01-10 NOTE — Patient Instructions (Addendum)
I have started a referral for you to receive a mammogram as well as a colonoscopy.  The mammogram is for screening for breast cancer and the colonoscopy is for screening for colon cancer.  I encourage you to continue using melatonin to help with your sleep and make sure that you are working on continuing to increase your water intake.  Please let us know if there is anything else we can do for you  Roney Jaffe, PA-C Physician Assistant [facility] [email or web address]   Health Maintenance, Female Adopting a healthy lifestyle and getting preventive care are important in promoting health and wellness. Ask your health care provider about:  The right schedule for you to have regular tests and exams.  Things you can do on your own to prevent diseases and keep yourself healthy. What should I know about diet, weight, and exercise? Eat a healthy diet  Eat a diet that includes plenty of vegetables, fruits, low-fat dairy products, and lean protein.  Do not eat a lot of foods that are high in solid fats, added sugars, or sodium.   Maintain a healthy weight Body mass index (BMI) is used to identify weight problems. It estimates body fat based on height and weight. Your health care provider can help determine your BMI and help you achieve or maintain a healthy weight. Get regular exercise Get regular exercise. This is one of the most important things you can do for your health. Most adults should:  Exercise for at least 150 minutes each week. The exercise should increase your heart rate and make you sweat (moderate-intensity exercise).  Do strengthening exercises at least twice a week. This is in addition to the moderate-intensity exercise.  Spend less time sitting. Even light physical activity can be beneficial. Watch cholesterol and blood lipids Have your blood tested for lipids and cholesterol at 63 years of age, then have this test  every 5 years. Have your cholesterol levels checked more often if:  Your lipid or cholesterol levels are high.  You are older than 63 years of age.  You are at high risk for heart disease. What should I know about cancer screening? Depending on your health history and family history, you may need to have cancer screening at various ages. This may include screening for:  Breast cancer.  Cervical cancer.  Colorectal cancer.  Skin cancer.  Lung cancer. What should I know about heart disease, diabetes, and high blood pressure? Blood pressure and heart disease  High blood pressure causes heart disease and increases the risk of stroke. This is more likely to develop in people who have high blood pressure readings, are of African descent, or are overweight.  Have your blood pressure checked: ? Every 3-5 years if you are 44-37 years of age. ? Every year if you are 29 years old or older. Diabetes Have regular diabetes screenings. This checks your fasting blood sugar level. Have the screening done:  Once every three years after age 23 if you are at a normal weight and have a low risk for diabetes.  More often and at a younger age if you are overweight or have a high risk for diabetes. What should I know about preventing infection? Hepatitis B If you have a higher risk for hepatitis B, you should be screened for this virus. Talk with your health care provider to find out if you are at risk for hepatitis B infection. Hepatitis C Testing is recommended for:  Everyone born from [birth year]  through 1965.  Anyone with known risk factors for hepatitis C. Sexually transmitted infections (STIs)  Get screened for STIs, including gonorrhea and chlamydia, if: ? You are sexually active and are younger than 63 years of age. ? You are older than 63 years of age and your health care provider tells you that you are at risk for this type of infection. ? Your sexual activity has changed since you were  last screened, and you are at increased risk for chlamydia or gonorrhea. Ask your health care provider if you are at risk.  Ask your health care provider about whether you are at high risk for HIV. Your health care provider may recommend a prescription medicine to help prevent HIV infection. If you choose to take medicine to prevent HIV, you should first get tested for HIV. You should then be tested every 3 months for as long as you are taking the medicine. Pregnancy  If you are about to stop having your period (premenopausal) and you may become pregnant, seek counseling before you get pregnant.  Take 400 to 800 micrograms (mcg) of folic acid every day if you become pregnant.  Ask for birth control (contraception) if you want to prevent pregnancy. Osteoporosis and menopause Osteoporosis is a disease in which the bones lose minerals and strength with aging. This can result in bone fractures. If you are 30 years old or older, or if you are at risk for osteoporosis and fractures, ask your health care provider if you should:  Be screened for bone loss.  Take a calcium or vitamin D supplement to lower your risk of fractures.  Be given hormone replacement therapy (HRT) to treat symptoms of menopause. Follow these instructions at home: Lifestyle  Do not use any products that contain nicotine or tobacco, such as cigarettes, e-cigarettes, and chewing tobacco. If you need help quitting, ask your health care provider.  Do not use street drugs.  Do not share needles.  Ask your health care provider for help if you need support or information about quitting drugs. Alcohol use  Do not drink alcohol if: ? Your health care provider tells you not to drink. ? You are pregnant, may be pregnant, or are planning to become pregnant.  If you drink alcohol: ? Limit how much you use to 0-1 drink a day. ? Limit intake if you are breastfeeding.  Be aware of how much alcohol is in your drink. In the [place]  one drink equals one 12 oz bottle of beer (355 mL), one 5 oz glass of wine (148 mL), or one 1 oz glass of hard liquor (44 mL). General instructions  Schedule regular health, dental, and eye exams.  Stay current with your vaccines.  Tell your health care provider if: ? You often feel depressed. ? You have ever been abused or do not feel safe at home. Summary  Adopting a healthy lifestyle and getting preventive care are important in promoting health and wellness.  Follow your health care provider's instructions about healthy diet, exercising, and getting tested or screened for diseases.  Follow your health care provider's instructions on monitoring your cholesterol and blood pressure. This information is not intended to replace advice given to you by your health care provider. Make sure you discuss any questions you have with your health care provider. Document Revised: 08/25/2018 Document Reviewed: 08/25/2018 Elsevier Patient Education  2021 [employer].

## 2021-01-10 NOTE — Progress Notes (Signed)
Patient has been out of medication for 3 days. Patient complains of HA and dizziness today.

## 2021-01-11 ENCOUNTER — Other Ambulatory Visit: Payer: Self-pay

## 2021-02-20 ENCOUNTER — Encounter: Payer: Self-pay | Admitting: Physician Assistant

## 2021-02-20 ENCOUNTER — Other Ambulatory Visit: Payer: Self-pay

## 2021-02-20 ENCOUNTER — Other Ambulatory Visit: Payer: Self-pay | Admitting: Physician Assistant

## 2021-02-20 ENCOUNTER — Ambulatory Visit: Payer: Self-pay | Attending: Physician Assistant | Admitting: Physician Assistant

## 2021-02-20 VITALS — BP 144/91 | HR 96 | Resp 16 | Wt 138.0 lb

## 2021-02-20 DIAGNOSIS — R21 Rash and other nonspecific skin eruption: Secondary | ICD-10-CM

## 2021-02-20 DIAGNOSIS — M62838 Other muscle spasm: Secondary | ICD-10-CM

## 2021-02-20 DIAGNOSIS — K219 Gastro-esophageal reflux disease without esophagitis: Secondary | ICD-10-CM

## 2021-02-20 DIAGNOSIS — Z13220 Encounter for screening for lipoid disorders: Secondary | ICD-10-CM

## 2021-02-20 DIAGNOSIS — I10 Essential (primary) hypertension: Secondary | ICD-10-CM

## 2021-02-20 DIAGNOSIS — G894 Chronic pain syndrome: Secondary | ICD-10-CM

## 2021-02-20 DIAGNOSIS — Z789 Other specified health status: Secondary | ICD-10-CM

## 2021-02-20 MED ORDER — TRIAMCINOLONE ACETONIDE 0.1 % EX CREA
1.0000 "application " | TOPICAL_CREAM | Freq: Two times a day (BID) | CUTANEOUS | 0 refills | Status: DC
  Filled 2021-02-20: qty 30, 15d supply, fill #0

## 2021-02-20 MED ORDER — GABAPENTIN 300 MG PO CAPS
ORAL_CAPSULE | Freq: Every day | ORAL | 6 refills | Status: DC
  Filled 2021-02-20: qty 30, 30d supply, fill #0
  Filled 2021-05-27: qty 30, 30d supply, fill #1
  Filled 2021-06-28: qty 30, 30d supply, fill #2
  Filled 2021-08-01: qty 30, 30d supply, fill #3
  Filled 2021-09-23: qty 30, 30d supply, fill #0
  Filled 2021-10-24: qty 30, 30d supply, fill #1
  Filled 2021-11-22: qty 30, 30d supply, fill #2

## 2021-02-20 MED ORDER — OMEPRAZOLE 20 MG PO CPDR
DELAYED_RELEASE_CAPSULE | Freq: Two times a day (BID) | ORAL | 3 refills | Status: DC
  Filled 2021-02-20: qty 60, 30d supply, fill #0
  Filled 2021-03-25: qty 60, 30d supply, fill #1
  Filled 2021-04-24: qty 60, 30d supply, fill #2
  Filled 2021-05-27: qty 60, 30d supply, fill #3

## 2021-02-20 MED ORDER — CETIRIZINE HCL 10 MG PO TABS
10.0000 mg | ORAL_TABLET | Freq: Every day | ORAL | 1 refills | Status: DC
  Filled 2021-02-20: qty 30, 30d supply, fill #0

## 2021-02-20 MED ORDER — METHOCARBAMOL 500 MG PO TABS
500.0000 mg | ORAL_TABLET | Freq: Three times a day (TID) | ORAL | 2 refills | Status: DC | PRN
  Filled 2021-02-20: qty 60, 20d supply, fill #0
  Filled 2021-03-25: qty 60, 20d supply, fill #1
  Filled 2021-04-24: qty 60, 20d supply, fill #2

## 2021-02-20 MED ORDER — LISINOPRIL-HYDROCHLOROTHIAZIDE 10-12.5 MG PO TABS
1.0000 | ORAL_TABLET | Freq: Every day | ORAL | 1 refills | Status: DC
  Filled 2021-02-20: qty 30, 30d supply, fill #0
  Filled 2021-03-25: qty 30, 30d supply, fill #1
  Filled 2021-04-24: qty 30, 30d supply, fill #2
  Filled 2021-05-27: qty 30, 30d supply, fill #3
  Filled 2021-06-28: qty 30, 30d supply, fill #4
  Filled 2021-08-01: qty 30, 30d supply, fill #5

## 2021-02-20 NOTE — Progress Notes (Signed)
Patient ID: ZOHA SPRANGER, female   DOB: 10/08/1955, 65 y.o.   MRN: [id number]   Kaylea Mounsey, is a 65 y.o. female  AST:[id number]  [id number]  DOB - 1956-05-05  Subjective:  Chief Complaint and HPI: Shane Melby is a 65 y.o. female here today for med RF and blood work.  Has a rash on her L leg that itches.  It has been there about 10 days.   She did not take her meds today bc she is completely out.  No CP, no SOB.     ROS:   Constitutional:  No f/c, No night sweats, No unexplained weight loss. EENT:  No vision changes, No blurry vision, No hearing changes. No mouth, throat, or ear problems.  Respiratory: No cough, No SOB Cardiac: No CP, no palpitations GI:  No abd pain, No N/V/D. GU: No Urinary s/sx Musculoskeletal: No joint pain Neuro: No headache, no dizziness, no motor weakness.  Skin: see above Endocrine:  No polydipsia. No polyuria.  Psych: Denies SI/HI  No problems updated.  ALLERGIES: No Known Allergies  PAST MEDICAL HISTORY: Past Medical History:  Diagnosis Date  . Anginal pain (HCC) 05/30/2012  . Chronic pain    right hip  . Hypertension   . Leg DVT (deep venous thromboembolism), chronic (HCC) 2009; 06/02/2012   "always left"  . Shortness of breath 05/30/2012  . Tuberculosis ~ 1980   "took medicine for it 6 months"    MEDICATIONS AT HOME: Prior to Admission medications   Medication Sig Start Date End Date Taking? Authorizing Provider  triamcinolone cream (KENALOG) 0.1 % Apply 1 application topically 2 (two) times daily. Prn itching 02/20/21  Yes Manasa Spease M, PA-C  acetaminophen (TYLENOL) 325 MG tablet Take 650 mg by mouth every 6 (six) hours as needed. Patient not taking: Reported on 02/20/2021    [provider]  cetirizine (ZYRTEC) 10 MG tablet Take 1 tablet (10 mg total) by mouth daily. 02/20/21   Anders Simmonds, PA-C  gabapentin (NEURONTIN) 300 MG capsule TAKE 1 CAPSULE (300 MG TOTAL) BY MOUTH AT BEDTIME. 02/20/21 02/20/22  Emon Miggins,  Marzella Schlein, PA-C  lisinopril-hydrochlorothiazide (ZESTORETIC) 10-12.5 MG tablet TAKE 1 TABLET BY MOUTH DAILY. 02/20/21 04/21/21  Anders Simmonds, PA-C  methocarbamol (ROBAXIN) 500 MG tablet Take 1 tablet (500 mg total) by mouth every 8 (eight) hours as needed for muscle spasms. 02/20/21   Anders Simmonds, PA-C  omeprazole (PRILOSEC) 20 MG capsule TAKE 1 CAPSULE (20 MG TOTAL) BY MOUTH 2 (TWO) TIMES DAILY BEFORE A MEAL. 02/20/21 02/20/22  Anders Simmonds, PA-C     Objective:  EXAM:   Vitals:   02/20/21 1049  BP: (!) 144/91  Pulse: 96  Resp: 16  SpO2: 97%  Weight: 138 lb (62.6 kg)    General appearance : A&OX3. NAD. Non-toxic-appearing Chest/Lungs:  Breathing-non-labored, Good air entry bilaterally, breath sounds normal without rales, rhonchi, or wheezing  CVS: S1 S2 regular, no murmurs, gallops, rubs  Extremities: Bilateral Lower Ext shows no edema, both legs are warm to touch with = pulse throughout Neurology:  CN II-XII grossly intact, Non focal.   Psych:  TP linear. J/I WNL. Normal speech. Appropriate eye contact and affect.  Skin:  Small area of dry appearing skin on L mid thigh.    Data Review No results found for: HGBA1C   Assessment & Plan   1. Essential hypertension Did not take med today.  Resume meds.  Check BP OOO and record-if not normal within  2 weeks after resuming med, make an appt.   - lisinopril-hydrochlorothiazide (ZESTORETIC) 10-12.5 MG tablet; TAKE 1 TABLET BY MOUTH DAILY.  Dispense: 90 tablet; Refill: 1 - Comprehensive metabolic panel - CBC with Differential/Platelet  2. Chronic pain syndrome stable - gabapentin (NEURONTIN) 300 MG capsule; TAKE 1 CAPSULE (300 MG TOTAL) BY MOUTH AT BEDTIME.  Dispense: 30 capsule; Refill: 6  3. Gastroesophageal reflux disease without esophagitis - omeprazole (PRILOSEC) 20 MG capsule; TAKE 1 CAPSULE (20 MG TOTAL) BY MOUTH 2 (TWO) TIMES DAILY BEFORE A MEAL.  Dispense: 60 capsule; Refill: 3  4. Muscle spasm - methocarbamol  (ROBAXIN) 500 MG tablet; Take 1 tablet (500 mg total) by mouth every 8 (eight) hours as needed for muscle spasms.  Dispense: 60 tablet; Refill: 2  5. Screening cholesterol level - Lipid panel  6. Rash triamcinolone sent  7. Language barrier [employer] interpreters(Suchili) used and additional time performing visit was required.      Patient have been counseled extensively about nutrition and exercise  Return in about 4 months (around 06/22/2021) for 4-6 months with PCP for chronic conditions.  The patient was given clear instructions to go to ER or return to medical center if symptoms don't improve, worsen or new problems develop. The patient verbalized understanding. The patient was told to call to get lab results if they haven't heard anything in the next week.     Georgian Co, PA-C [facility] [place], [place] [phone]   02/20/2021, 11:17 AM

## 2021-02-21 ENCOUNTER — Other Ambulatory Visit: Payer: Self-pay | Admitting: Physician Assistant

## 2021-02-21 DIAGNOSIS — R739 Hyperglycemia, unspecified: Secondary | ICD-10-CM

## 2021-02-21 LAB — CBC WITH DIFFERENTIAL/PLATELET
Basophils Absolute: 0 10*3/uL (ref 0.0–0.2)
Basos: 1 %
EOS (ABSOLUTE): 0.1 10*3/uL (ref 0.0–0.4)
Eos: 2 %
Hematocrit: 36.2 % (ref 34.0–46.6)
Hemoglobin: 11.8 g/dL (ref 11.1–15.9)
Immature Grans (Abs): 0 10*3/uL (ref 0.0–0.1)
Immature Granulocytes: 0 %
Lymphocytes Absolute: 1.9 10*3/uL (ref 0.7–3.1)
Lymphs: 33 %
MCH: 29.9 pg (ref 26.6–33.0)
MCHC: 32.6 g/dL (ref 31.5–35.7)
MCV: 92 fL (ref 79–97)
Monocytes Absolute: 0.6 10*3/uL (ref 0.1–0.9)
Monocytes: 11 %
Neutrophils Absolute: 3.2 10*3/uL (ref 1.4–7.0)
Neutrophils: 53 %
Platelets: 215 10*3/uL (ref 150–450)
RBC: 3.95 x10E6/uL (ref 3.77–5.28)
RDW: 13.9 % (ref 11.7–15.4)
WBC: 5.9 10*3/uL (ref 3.4–10.8)

## 2021-02-21 LAB — COMPREHENSIVE METABOLIC PANEL
ALT: 25 IU/L (ref 0–32)
AST: 26 IU/L (ref 0–40)
Albumin/Globulin Ratio: 1.2 (ref 1.2–2.2)
Albumin: 4.2 g/dL (ref 3.8–4.8)
Alkaline Phosphatase: 103 IU/L (ref 44–121)
BUN/Creatinine Ratio: 13 (ref 12–28)
BUN: 10 mg/dL (ref 8–27)
Bilirubin Total: 0.6 mg/dL (ref 0.0–1.2)
CO2: 23 mmol/L (ref 20–29)
Calcium: 9.1 mg/dL (ref 8.7–10.3)
Chloride: 102 mmol/L (ref 96–106)
Creatinine, Ser: 0.76 mg/dL (ref 0.57–1.00)
Globulin, Total: 3.4 g/dL (ref 1.5–4.5)
Glucose: 147 mg/dL — ABNORMAL HIGH (ref 65–99)
Potassium: 4 mmol/L (ref 3.5–5.2)
Sodium: 139 mmol/L (ref 134–144)
Total Protein: 7.6 g/dL (ref 6.0–8.5)
eGFR: 89 mL/min/{1.73_m2} (ref >59)

## 2021-02-21 LAB — LIPID PANEL
Chol/HDL Ratio: 3.5 ratio (ref 0.0–4.4)
Cholesterol, Total: 140 mg/dL (ref 100–199)
HDL: 40 mg/dL (ref >39)
LDL Chol Calc (NIH): 81 mg/dL (ref 0–99)
Triglycerides: 105 mg/dL (ref 0–149)
VLDL Cholesterol Cal: 19 mg/dL (ref 5–40)

## 2021-02-26 NOTE — Progress Notes (Signed)
Left message with son per DPR. Encouraged to come to the office on a Friday for DM test- A1C per Georgian Co, PA-C Verbalized understanding.

## 2021-03-11 ENCOUNTER — Ambulatory Visit: Payer: Self-pay | Admitting: Family Medicine

## 2021-03-25 ENCOUNTER — Other Ambulatory Visit: Payer: Self-pay

## 2021-03-26 ENCOUNTER — Other Ambulatory Visit: Payer: Self-pay

## 2021-04-24 ENCOUNTER — Other Ambulatory Visit: Payer: Self-pay

## 2021-05-27 ENCOUNTER — Other Ambulatory Visit: Payer: Self-pay

## 2021-06-28 ENCOUNTER — Other Ambulatory Visit: Payer: Self-pay | Admitting: Physician Assistant

## 2021-06-28 ENCOUNTER — Other Ambulatory Visit: Payer: Self-pay

## 2021-06-28 DIAGNOSIS — K219 Gastro-esophageal reflux disease without esophagitis: Secondary | ICD-10-CM

## 2021-06-28 MED ORDER — OMEPRAZOLE 20 MG PO CPDR
DELAYED_RELEASE_CAPSULE | Freq: Two times a day (BID) | ORAL | 3 refills | Status: DC
  Filled 2021-06-28: qty 60, 30d supply, fill #0
  Filled 2021-08-01: qty 60, 30d supply, fill #1
  Filled 2021-09-23: qty 60, 30d supply, fill #0
  Filled 2021-10-24: qty 60, 30d supply, fill #1

## 2021-06-28 NOTE — Telephone Encounter (Signed)
Requested Prescriptions  Pending Prescriptions Disp Refills  . omeprazole (PRILOSEC) 20 MG capsule 60 capsule 3    Sig: TAKE 1 CAPSULE (20 MG TOTAL) BY MOUTH 2 (TWO) TIMES DAILY BEFORE A MEAL.     Gastroenterology: Proton Pump Inhibitors Passed - 06/28/2021  9:29 AM      Passed - Valid encounter within last 12 months    Recent Outpatient Visits          4 months ago Essential hypertension   [facility] [place], [place], [place]   1 year ago Sinus headache   [facility] Hoy Register, MD   2 years ago Annual physical exam   [facility] Hoy Register, MD   2 years ago Screening for viral disease   [facility] Hoy Register, MD   2 years ago Essential hypertension   [facility] [place], [place], [place]

## 2021-07-03 ENCOUNTER — Other Ambulatory Visit: Payer: Self-pay

## 2021-07-15 ENCOUNTER — Ambulatory Visit: Payer: Self-pay | Admitting: *Deleted

## 2021-07-15 NOTE — Telephone Encounter (Signed)
Reason for Disposition  [1] SEVERE pain (e.g., excruciating, unable to walk) AND [2] not improved after 2 hours of pain medicine  Answer Assessment - Initial Assessment Questions 1. ONSET: "When did the swelling start?" (e.g., minutes, hours, days)     2 days ago 2. LOCATION: "What part of the leg is swollen?"  "Are both legs swollen or just one leg?"     R leg, numbness,  3. SEVERITY: "How bad is the swelling?" (e.g., localized; mild, moderate, severe)  - Localized - small area of swelling localized to one leg  - MILD pedal edema - swelling limited to foot and ankle, pitting edema < 1/4 inch (6 mm) deep, rest and elevation eliminate most or all swelling  - MODERATE edema - swelling of lower leg to knee, pitting edema > 1/4 inch (6 mm) deep, rest and elevation only partially reduce swelling  - SEVERE edema - swelling extends above knee, facial or hand swelling present      Just the knee 4. REDNESS: "Does the swelling look red or infected?"     Inside knee- hard to walk 5. PAIN: "Is the swelling painful to touch?" If Yes, ask: "How painful is it?"   (Scale 1-10; mild, moderate or severe)     In the morning the pian is worse 6. FEVER: "Do you have a fever?" If Yes, ask: "What is it, how was it measured, and when did it start?"      no 7. CAUSE: "What do you think is causing the leg swelling?"     On the left side- but not this leg 8. MEDICAL HISTORY: "Do you have a history of heart failure, kidney disease, liver failure, or cancer?"     no 9. RECURRENT SYMPTOM: "Have you had leg swelling before?" If Yes, ask: "When was the last time?" "What happened that time?"     no 10. OTHER SYMPTOMS: "Do you have any other symptoms?" (e.g., chest pain, difficulty breathing)       no 11. PREGNANCY: "Is there any chance you are pregnant?" "When was your last menstrual period?"       na  Protocols used: Leg Swelling and Edema-A-AH, Knee Pain-A-AH

## 2021-07-15 NOTE — Telephone Encounter (Signed)
Patient's son is translating for patient- patient complains of knee pain for 2 days. No swelling or injury noted. Patient states her knee pain is bad and son reports she is not walking on it due to the pain. Appointment has been scheduled for tomorrow am.

## 2021-07-16 ENCOUNTER — Encounter: Payer: Self-pay | Admitting: Physician Assistant

## 2021-07-16 ENCOUNTER — Other Ambulatory Visit: Payer: Self-pay

## 2021-07-16 ENCOUNTER — Ambulatory Visit: Payer: Self-pay | Attending: Physician Assistant | Admitting: Physician Assistant

## 2021-07-16 VITALS — BP 171/107 | HR 79 | Temp 98.7°F | Resp 18 | Ht 61.5 in | Wt 145.0 lb

## 2021-07-16 DIAGNOSIS — M25561 Pain in right knee: Secondary | ICD-10-CM

## 2021-07-16 DIAGNOSIS — I10 Essential (primary) hypertension: Secondary | ICD-10-CM

## 2021-07-16 MED ORDER — IBUPROFEN 200 MG PO TABS
200.0000 mg | ORAL_TABLET | Freq: Four times a day (QID) | ORAL | 0 refills | Status: DC | PRN
  Filled 2021-07-16: qty 30, 8d supply, fill #0

## 2021-07-16 NOTE — Patient Instructions (Addendum)
Please start checking your pressure at home, keep a written log and bring that with you to your next appointment with the clinic pharmacist in 1 month for a blood pressure review  For your knee pain, I have ordered an x-ray to be completed, I encourage you to use a brace, icing, and ibuprofen as needed.  I sent ibuprofen to your pharmacy.  Roney Jaffe, PA-C Physician Assistant [facility] [email or web address]   How to Take Your Blood Pressure Blood pressure is a measurement of how strongly your blood is pressing against the walls of your arteries. Arteries are blood vessels that carry blood from your heart throughout your body. Your health care provider takes your blood pressure at each office visit. You can also take your own blood pressure at home with a blood pressure monitor. You may need to take your own blood pressure to: Confirm a diagnosis of high blood pressure (hypertension). Monitor your blood pressure over time. Make sure your blood pressure medicine is working. Supplies needed: Blood pressure monitor. Dining room chair to sit in. Table or desk. Small notebook and pencil or pen. How to prepare To get the most accurate reading, avoid the following for 30 minutes before you check your blood pressure: Drinking caffeine. Drinking alcohol. Eating. Smoking. Exercising. Five minutes before you check your blood pressure: Use the bathroom and urinate so that you have an empty bladder. Sit quietly in a dining room chair. Do not sit in a soft couch or an armchair. Do not talk. How to take your blood pressure To check your blood pressure, follow the instructions in the manual that came with your blood pressure monitor. If you have a digital blood pressure monitor, the instructions may be as follows: Sit up straight in a chair. Place your feet on the floor. Do not cross your ankles or legs. Rest your left arm at the level of  your heart on a table or desk or on the arm of a chair. Pull up your shirt sleeve. Wrap the blood pressure cuff around the upper part of your left arm, 1 inch (2.5 cm) above your elbow. It is best to wrap the cuff around bare skin. Fit the cuff snugly around your arm. You should be able to place only one finger between the cuff and your arm. Position the cord so that it rests in the bend of your elbow. Press the power button. Sit quietly while the cuff inflates and deflates. Read the digital reading on the monitor screen and write the numbers down (record them) in a notebook. Wait 2-3 minutes, then repeat the steps, starting at step 1. What does my blood pressure reading mean? A blood pressure reading consists of a higher number over a lower number. Ideally, your blood pressure should be below 120/80. The first ("top") number is called the systolic pressure. It is a measure of the pressure in your arteries as your heart beats. The second ("bottom") number is called the diastolic pressure. It is a measure of the pressure in your arteries as the heart relaxes. Blood pressure is classified into five stages. The following are the stages for adults who do not have a short-term serious illness or a chronic condition. Systolic pressure and diastolic pressure are measured in a unit called mm Hg (millimeters of mercury).  Normal Systolic pressure: below 120. Diastolic pressure: below 80. Elevated Systolic pressure: 120-129. Diastolic pressure: below 80. Hypertension stage 1 Systolic pressure: 130-139. Diastolic pressure: 80-89. Hypertension stage 2 Systolic  pressure: 140 or above. Diastolic pressure: 90 or above. You can have elevated blood pressure or hypertension even if only the systolic or only the diastolic number in your reading is higher than normal. Follow these instructions at home: Medicines Take over-the-counter and prescription medicines only as told by your health care provider. Tell  your health care provider if you are having any side effects from blood pressure medicine. General instructions Check your blood pressure as often as recommended by your health care provider. Check your blood pressure at the same time every day. Take your monitor to the next appointment with your health care provider to make sure that: You are using it correctly. It provides accurate readings. Understand what your goal blood pressure numbers are. Keep all follow-up visits as told by your health care provider. This is important. General tips Your health care provider can suggest a reliable monitor that will meet your needs. There are several types of home blood pressure monitors. Choose a monitor that has an arm cuff. Do not choose a monitor that measures your blood pressure from your wrist or finger. Choose a cuff that wraps snugly around your upper arm. You should be able to fit only one finger between your arm and the cuff. You can buy a blood pressure monitor at most drugstores or online. Where to find more information American Heart Association: www.heart.org Contact a health care provider if: Your blood pressure is consistently high. Your blood pressure is suddenly low. Get help right away if: Your systolic blood pressure is higher than 180. Your diastolic blood pressure is higher than 120. Summary Blood pressure is a measurement of how strongly your blood is pressing against the walls of your arteries. A blood pressure reading consists of a higher number over a lower number. Ideally, your blood pressure should be below 120/80. Check your blood pressure at the same time every day. Avoid caffeine, alcohol, smoking, and exercise for 30 minutes prior to checking your blood pressure. These agents can affect the accuracy of the blood pressure reading. This information is not intended to replace advice given to you by your health care provider. Make sure you discuss any questions you have  with your health care provider. Document Revised: 07/11/2020 Document Reviewed: 08/26/2019 Elsevier Patient Education  2022 Elsevier Inc.   Acute Knee Pain, Adult Acute knee pain is sudden and may be caused by damage, swelling, or irritation of the muscles and tissues that support the knee. Pain may result from: A fall. An injury to the knee from twisting motions. A hit to the knee. Infection. Acute knee pain may go away on its own with time and rest. If it does not, your health care provider may order tests to find the cause of the pain. These may include: Imaging tests, such as an X-ray, MRI, CT scan, or ultrasound. Joint aspiration. In this test, fluid is removed from the knee and evaluated. Arthroscopy. In this test, a lighted tube is inserted into the knee and an image is projected onto a TV screen. Biopsy. In this test, a sample of tissue is removed from the body and studied under a microscope. Follow these instructions at home: If you have a knee sleeve or brace:  Wear the knee sleeve or brace as told by your health care provider. Remove it only as told by your health care provider. Loosen it if your toes tingle, become numb, or turn cold and blue. Keep it clean. If the knee sleeve or brace is  not waterproof: Do not let it get wet. Cover it with a watertight covering when you take a bath or shower. Activity Rest your knee. Do not do things that cause pain or make pain worse. Avoid high-impact activities or exercises, such as running, jumping rope, or doing jumping jacks. Work with a physical therapist to make a safe exercise program, as recommended by your health care provider. Do exercises as told by your physical therapist. Managing pain, stiffness, and swelling  If directed, put ice on the affected knee. To do this: If you have a removable knee sleeve or brace, remove it as told by your health care provider. Put ice in a plastic bag. Place a towel between your skin and  the bag. Leave the ice on for 20 minutes, 2-3 times a day. Remove the ice if your skin turns bright red. This is very important. If you cannot feel pain, heat, or cold, you have a greater risk of damage to the area. If directed, use an elastic bandage to put pressure (compression) on your injured knee. This may control swelling, give support, and help with discomfort. Raise (elevate) your knee above the level of your heart while you are sitting or lying down. Sleep with a pillow under your knee. General instructions Take over-the-counter and prescription medicines only as told by your health care provider. Do not use any products that contain nicotine or tobacco, such as cigarettes, e-cigarettes, and chewing tobacco. If you need help quitting, ask your health care provider. If you are overweight, work with your health care provider and a dietitian to set a weight-loss goal that is healthy and reasonable for you. Extra weight can put pressure on your knee. Pay attention to any changes in your symptoms. Keep all follow-up visits. This is important. Contact a health care provider if: Your knee pain continues, changes, or gets worse. You have a fever along with knee pain. Your knee feels warm to the touch or is red. Your knee buckles or locks up. Get help right away if: Your knee swells, and the swelling becomes worse. You cannot move your knee. You have severe pain in your knee that cannot be managed with pain medicine. Summary Acute knee pain can be caused by a fall, an injury, an infection, or damage, swelling, or irritation of the tissues that support your knee. Your health care provider may perform tests to find out the cause of the pain. Pay attention to any changes in your symptoms. Relieve your pain with rest, medicines, light activity, and the use of ice. Get help right away if your knee swells, you cannot move your knee, or you have severe pain that cannot be managed with  medicine. This information is not intended to replace advice given to you by your health care provider. Make sure you discuss any questions you have with your health care provider. Document Revised: 02/15/2020 Document Reviewed: 02/15/2020 Elsevier Patient Education  2022 [employer].

## 2021-07-16 NOTE — Progress Notes (Signed)
Established Patient Office Visit  Subjective:  Patient ID: Ashley Jacobs, female    DOB: 1955-12-26  Age: 65 y.o. MRN: [id number]  CC:  Chief Complaint  Patient presents with   Knee Pain    HPI Ashley Jacobs states that she has been experiencing right knee pain for the past 2 days.  Denies injury or trauma, states that she woke up with the pain.  Reports that it is worse in the morning, does get better throughout the day.  States that she has used some Tylenol with a little relief.  Denies swelling, numbness or tingling.  States that she does not check her blood pressure at home, states that she has been taking her blood pressure medication on a daily basis, denies any hypertensive symptoms.  Due to language barrier, an interpreter was present during the history-taking and subsequent discussion (and for part of the physical exam) with this patient.    Past Medical History:  Diagnosis Date   Anginal pain ([place]) 05/30/2012   Chronic pain    right hip   Hypertension    Leg DVT (deep venous thromboembolism), chronic ([place]) 2009; 06/02/2012   "always left"   Shortness of breath 05/30/2012   Tuberculosis ~ 1980   "took medicine for it 6 months"    Past Surgical History:  Procedure Laterality Date   VENA CAVA FILTER PLACEMENT  06/02/12    History reviewed. No pertinent family history.  Social History   Socioeconomic History   Marital status: Married    Spouse name: Not on file   Number of children: Not on file   Years of education: Not on file   Highest education level: Not on file  Occupational History   Not on file  Tobacco Use   Smoking status: Never   Smokeless tobacco: Never  Substance and Sexual Activity   Alcohol use: No   Drug use: No   Sexual activity: Never  Other Topics Concern   Not on file  Social History Narrative   Not on file   Social Determinants of Health   Financial Resource Strain: Not on file  Food Insecurity: Not on file   Transportation Needs: Not on file  Physical Activity: Not on file  Stress: Not on file  Social Connections: Not on file  Intimate Partner Violence: Not on file    Outpatient Medications Prior to Visit  Medication Sig Dispense Refill   acetaminophen (TYLENOL) 325 MG tablet Take 650 mg by mouth every 6 (six) hours as needed.     cetirizine (ZYRTEC) 10 MG tablet Take 1 tablet (10 mg total) by mouth daily. 30 tablet 1   gabapentin (NEURONTIN) 300 MG capsule TAKE 1 CAPSULE (300 MG TOTAL) BY MOUTH AT BEDTIME. 30 capsule 6   lisinopril-hydrochlorothiazide (ZESTORETIC) 10-12.5 MG tablet TAKE 1 TABLET BY MOUTH DAILY. 90 tablet 1   methocarbamol (ROBAXIN) 500 MG tablet Take 1 tablet (500 mg total) by mouth every 8 (eight) hours as needed for muscle spasms. 60 tablet 2   omeprazole (PRILOSEC) 20 MG capsule TAKE 1 CAPSULE (20 MG TOTAL) BY MOUTH 2 (TWO) TIMES DAILY BEFORE A MEAL. 60 capsule 3   triamcinolone cream (KENALOG) 0.1 % Apply 1 application topically 2 (two) times daily as needed for itching 30 g 0   No facility-administered medications prior to visit.    No Known Allergies  ROS Review of Systems  Constitutional: Negative.   HENT: Negative.    Eyes: Negative.   Respiratory:  Negative for shortness of breath.   Cardiovascular:  Negative for chest pain.  Gastrointestinal: Negative.   Endocrine: Negative.   Genitourinary: Negative.   Musculoskeletal:  Positive for arthralgias. Negative for joint swelling.  Skin: Negative.   Allergic/Immunologic: Negative.   Neurological:  Negative for dizziness, syncope and headaches.  Hematological: Negative.   Psychiatric/Behavioral: Negative.       Objective:    Physical Exam Vitals and nursing note reviewed.  Constitutional:      General: She is not in acute distress.    Appearance: Normal appearance. She is not ill-appearing.  HENT:     Head: Normocephalic and atraumatic.     Right Ear: External ear normal.     Left Ear: External ear  normal.     Nose: Nose normal.     Mouth/Throat:     Mouth: Mucous membranes are moist.     Pharynx: Oropharynx is clear.  Eyes:     Extraocular Movements: Extraocular movements intact.     Conjunctiva/sclera: Conjunctivae normal.     Pupils: Pupils are equal, round, and reactive to light.  Cardiovascular:     Rate and Rhythm: Normal rate and regular rhythm.     Pulses: Normal pulses.     Heart sounds: Normal heart sounds.  Pulmonary:     Effort: Pulmonary effort is normal.     Breath sounds: Normal breath sounds.  Musculoskeletal:     Cervical back: Normal range of motion and neck supple.     Right knee: No swelling. Decreased range of motion. Tenderness present.     Left knee: Normal.     Comments: Pain elicited with ROM testing  Skin:    General: Skin is warm and dry.  Neurological:     General: No focal deficit present.     Mental Status: She is alert and oriented to person, place, and time.  Psychiatric:        Mood and Affect: Mood normal.        Behavior: Behavior normal.        Thought Content: Thought content normal.        Judgment: Judgment normal.    BP (!) 171/107 (BP Location: Left Arm, Patient Position: Sitting, Cuff Size: Large)   Pulse 79   Temp 98.7 F (37.1 C) (Oral)   Resp 18   Ht 5' 1.5" (1.562 m)   Wt 145 lb (65.8 kg)   SpO2 95%   BMI 26.95 kg/m  Wt Readings from Last 3 Encounters:  07/16/21 145 lb (65.8 kg)  02/20/21 138 lb (62.6 kg)  01/10/21 141 lb (64 kg)     Health Maintenance Due  Topic Date Due   COLONOSCOPY (Pts 45-40yr Insurance coverage will need to be confirmed)  Never done   MAMMOGRAM  Never done   Zoster Vaccines- Shingrix (1 of 2) Never done   COLON CANCER SCREENING ANNUAL FOBT  06/16/2020   INFLUENZA VACCINE  04/15/2021    There are no preventive care reminders to display for this patient.  Lab Results  Component Value Date   TSH 0.721 10/15/2020   Lab Results  Component Value Date   WBC 5.9 02/20/2021   HGB  11.8 02/20/2021   HCT 36.2 02/20/2021   MCV 92 02/20/2021   PLT 215 02/20/2021   Lab Results  Component Value Date   NA 139 02/20/2021   K 4.0 02/20/2021   CO2 23 02/20/2021   GLUCOSE 147 (H) 02/20/2021   BUN 10 02/20/2021  CREATININE 0.76 02/20/2021   BILITOT 0.6 02/20/2021   ALKPHOS 103 02/20/2021   AST 26 02/20/2021   ALT 25 02/20/2021   PROT 7.6 02/20/2021   ALBUMIN 4.2 02/20/2021   CALCIUM 9.1 02/20/2021   ANIONGAP 11 07/03/2014   EGFR 89 02/20/2021   Lab Results  Component Value Date   CHOL 140 02/20/2021   Lab Results  Component Value Date   HDL 40 02/20/2021   Lab Results  Component Value Date   LDLCALC 81 02/20/2021   Lab Results  Component Value Date   TRIG 105 02/20/2021   Lab Results  Component Value Date   CHOLHDL 3.5 02/20/2021   No results found for: HGBA1C    Assessment & Plan:   Problem List Items Addressed This Visit       Cardiovascular and Mediastinum   Essential hypertension   Elevated blood pressure reading in office with diagnosis of hypertension     Other   Acute pain of right knee - Primary   Relevant Medications   ibuprofen (ADVIL) 200 MG tablet   Other Relevant Orders   DG Knee 1-2 Views Right    Meds ordered this encounter  Medications   ibuprofen (ADVIL) 200 MG tablet    Sig: Take 1 tablet (200 mg total) by mouth every 6 (six) hours as needed.    Dispense:  30 tablet    Refill:  0    Order Specific Question:   Supervising Provider    Answer:   Joya Gaskins, PATRICK E [[id number]]  1. Acute pain of right knee Patient education given on supportive care, trial ibuprofen, red flags given for prompt reevaluation - DG Knee 1-2 Views Right; Future - ibuprofen (ADVIL) 200 MG tablet; Take 1 tablet (200 mg total) by mouth every 6 (six) hours as needed.  Dispense: 30 tablet; Refill: 0  2. Elevated blood pressure reading in office with diagnosis of hypertension Patient encouraged to check blood pressure at home, keep a written log and  have available for all office visits.  Patient given appointment to follow-up with clinic pharmacist in 1 month for review of blood pressure readings.  Patient understands and agrees.  Red flags given for prompt reevaluation.  3. Essential hypertension    I have reviewed the patient's medical history (PMH, PSH, Social History, Family History, Medications, and allergies) , and have been updated if relevant. I spent 30 minutes reviewing chart and  face to face time with patient.     Follow-up: Return in about 4 weeks (around 08/13/2021).    Loraine Grip Mayers, PA-C

## 2021-07-17 ENCOUNTER — Ambulatory Visit (HOSPITAL_COMMUNITY)
Admission: RE | Admit: 2021-07-17 | Discharge: 2021-07-17 | Disposition: A | Discharge status: Home / Self Care | Payer: Self-pay | Source: Ambulatory Visit | Attending: Physician Assistant | Admitting: Physician Assistant

## 2021-07-17 DIAGNOSIS — M25561 Pain in right knee: Secondary | ICD-10-CM | POA: Insufficient documentation

## 2021-07-23 ENCOUNTER — Telehealth: Payer: Self-pay | Admitting: *Deleted

## 2021-07-23 NOTE — Telephone Encounter (Signed)
Medical Assistant used [employer] to contact patient.  Interpreter Name: Darci Needle Interpreter #: [id number] Medical Assistant left message on patient's home and cell voicemail. Voicemail states to give a call back to [place] with [facility] at [phone].

## 2021-07-23 NOTE — Telephone Encounter (Signed)
-----   Message from Roney Jaffe, [place] sent at 07/18/2021  8:36 AM EDT ----- Please call patient and let her know that her x-ray of her knee did not show any acute abnormality.  I will start a referral for her to be seen by orthopedics for further evaluation if she is still experiencing pain.

## 2021-07-25 ENCOUNTER — Other Ambulatory Visit: Payer: Self-pay

## 2021-08-01 ENCOUNTER — Other Ambulatory Visit: Payer: Self-pay

## 2021-08-13 ENCOUNTER — Ambulatory Visit: Payer: Self-pay | Attending: Family Medicine | Admitting: Family Medicine

## 2021-08-13 ENCOUNTER — Other Ambulatory Visit: Payer: Self-pay

## 2021-08-13 ENCOUNTER — Encounter: Payer: Self-pay | Admitting: Family Medicine

## 2021-08-13 VITALS — BP 177/111 | HR 85 | Ht 65.0 in | Wt 148.2 lb

## 2021-08-13 DIAGNOSIS — Z1231 Encounter for screening mammogram for malignant neoplasm of breast: Secondary | ICD-10-CM

## 2021-08-13 DIAGNOSIS — I10 Essential (primary) hypertension: Secondary | ICD-10-CM

## 2021-08-13 DIAGNOSIS — M62838 Other muscle spasm: Secondary | ICD-10-CM

## 2021-08-13 DIAGNOSIS — Z23 Encounter for immunization: Secondary | ICD-10-CM

## 2021-08-13 DIAGNOSIS — Z1211 Encounter for screening for malignant neoplasm of colon: Secondary | ICD-10-CM

## 2021-08-13 MED ORDER — METHOCARBAMOL 500 MG PO TABS
500.0000 mg | ORAL_TABLET | Freq: Three times a day (TID) | ORAL | 2 refills | Status: DC | PRN
  Filled 2021-08-13 – 2022-02-20 (×2): qty 60, 20d supply, fill #0
  Filled 2022-03-28: qty 60, 20d supply, fill #1

## 2021-08-13 MED ORDER — DICLOFENAC SODIUM 1 % EX GEL
4.0000 g | Freq: Four times a day (QID) | CUTANEOUS | 1 refills | Status: DC
  Filled 2021-08-13: qty 100, 6d supply, fill #0

## 2021-08-13 MED ORDER — LISINOPRIL-HYDROCHLOROTHIAZIDE 20-25 MG PO TABS
1.0000 | ORAL_TABLET | Freq: Every day | ORAL | 1 refills | Status: DC
  Filled 2021-08-13 – 2021-09-23 (×2): qty 30, 30d supply, fill #0
  Filled 2021-10-24: qty 30, 30d supply, fill #1
  Filled 2021-11-22: qty 30, 30d supply, fill #2
  Filled 2021-12-26: qty 30, 30d supply, fill #3
  Filled 2022-01-24: qty 30, 30d supply, fill #4

## 2021-08-13 NOTE — Patient Instructions (Signed)

## 2021-08-13 NOTE — Progress Notes (Signed)
Bp 182/125 retake 162/116

## 2021-08-13 NOTE — Progress Notes (Signed)
Subjective:  Patient ID: Ashley Jacobs, female    DOB: 16-Jun-1956  Age: 65 y.o. MRN: [id number]  CC: Leg Pain   HPI Ashley Jacobs is a 65 y.o. year old female with a history of hypertension, GERD, chronic pain.  Interval History: She had scheduled this appointment due to complains of right leg pain which she states is better but she now has left sided thoracic pain x1 day.  She denies heavy lifting but always has to lift her 63 year old Grandchild Pain is worse with twisting motions or deep breathing. Denies presence of dyspnea, wheezing, chest pain. Endorses compliance with her antihypertensive but her blood pressure is elevated today.  Repeat blood pressure is also elevated.  Past Medical History:  Diagnosis Date   Anginal pain ([place]) 05/30/2012   Chronic pain    right hip   Hypertension    Leg DVT (deep venous thromboembolism), chronic ([place]) 2009; 06/02/2012   "always left"   Shortness of breath 05/30/2012   Tuberculosis ~ 1980   "took medicine for it 6 months"    Past Surgical History:  Procedure Laterality Date   VENA CAVA FILTER PLACEMENT  06/02/12    History reviewed. No pertinent family history.  No Known Allergies  Outpatient Medications Prior to Visit  Medication Sig Dispense Refill   acetaminophen (TYLENOL) 325 MG tablet Take 650 mg by mouth every 6 (six) hours as needed.     cetirizine (ZYRTEC) 10 MG tablet Take 1 tablet (10 mg total) by mouth daily. 30 tablet 1   gabapentin (NEURONTIN) 300 MG capsule TAKE 1 CAPSULE (300 MG TOTAL) BY MOUTH AT BEDTIME. 30 capsule 6   ibuprofen (ADVIL) 200 MG tablet Take 1 tablet (200 mg total) by mouth every 6 (six) hours as needed. 30 tablet 0   omeprazole (PRILOSEC) 20 MG capsule TAKE 1 CAPSULE (20 MG TOTAL) BY MOUTH 2 (TWO) TIMES DAILY BEFORE A MEAL. 60 capsule 3   triamcinolone cream (KENALOG) 0.1 % Apply 1 application topically 2 (two) times daily as needed for itching 30 g 0   lisinopril-hydrochlorothiazide (ZESTORETIC)  10-12.5 MG tablet TAKE 1 TABLET BY MOUTH DAILY. 90 tablet 1   methocarbamol (ROBAXIN) 500 MG tablet Take 1 tablet (500 mg total) by mouth every 8 (eight) hours as needed for muscle spasms. 60 tablet 2   No facility-administered medications prior to visit.     ROS Review of Systems  Constitutional:  Negative for activity change, appetite change and fatigue.  HENT:  Negative for congestion, sinus pressure and sore throat.   Eyes:  Negative for visual disturbance.  Respiratory:  Negative for cough, chest tightness, shortness of breath and wheezing.   Cardiovascular:  Negative for chest pain and palpitations.  Gastrointestinal:  Negative for abdominal distention, abdominal pain and constipation.  Endocrine: Negative for polydipsia.  Genitourinary:  Negative for dysuria and frequency.  Musculoskeletal:        See HPI  Skin:  Negative for rash.  Neurological:  Negative for tremors, light-headedness and numbness.  Hematological:  Does not bruise/bleed easily.  Psychiatric/Behavioral:  Negative for agitation and behavioral problems.    Objective:  BP (!) 177/111   Pulse 85   Ht 5\' 5"  (1.651 m)   Wt 148 lb 3.2 oz (67.2 kg)   SpO2 98%   BMI 24.66 kg/m   BP/Weight 08/13/2021 [id number]  Systolic BP 123XX123 [id number] 123456  Diastolic BP 99991111 [id number] 91  Wt. (Lbs) 148.2 145 138  BMI 24.66  26.95 22.96      Physical Exam Constitutional:      Appearance: She is well-developed.  Cardiovascular:     Rate and Rhythm: Normal rate.     Heart sounds: Normal heart sounds. No murmur heard. Pulmonary:     Effort: Pulmonary effort is normal.     Breath sounds: Normal breath sounds. No wheezing or rales.  Chest:     Chest wall: No tenderness.  Abdominal:     General: Bowel sounds are normal. There is no distension.     Palpations: Abdomen is soft. There is no mass.     Tenderness: There is no abdominal tenderness.  Musculoskeletal:        General: Tenderness (TTP of left side beneath L arm and  and on twisting motion of spine) present. Normal range of motion.     Right lower leg: No edema.     Left lower leg: No edema.  Neurological:     Mental Status: She is alert and oriented to person, place, and time.  Psychiatric:        Mood and Affect: Mood normal.    CMP Latest Ref Rng & Units 02/20/2021 10/15/2020 11/30/2019  Glucose 65 - 99 mg/dL 161(W) 96 89  BUN 8 - 27 mg/dL 10 9 8   Creatinine 0.57 - 1.00 mg/dL 9.60 4.54  Sodium 134 - 144 mmol/L 139 142 140  Potassium 3.5 - 5.2 mmol/L 4.0 3.9 3.7  Chloride 96 - 106 mmol/L 102 105 103  CO2 20 - 29 mmol/L 23 - 25  Calcium 8.7 - 10.3 mg/dL 9.1 9.2 9.1  Total Protein 6.0 - 8.5 g/dL 7.6 7.8 -  Total Bilirubin 0.0 - 1.2 mg/dL 0.6 0.6 -  Alkaline Phos 44 - 121 IU/L 103 107 -  AST 0 - 40 IU/L 26 22 -  ALT 0 - 32 IU/L 25 - -    Lipid Panel     Component Value Date/Time   CHOL 140 02/20/2021 1133   TRIG 105 02/20/2021 1133   HDL 40 02/20/2021 1133   CHOLHDL 3.5 02/20/2021 1133   CHOLHDL 4.0 10/17/2016 0836   VLDL 24 11/16/2015 1051   LDLCALC 81 02/20/2021 1133    CBC    Component Value Date/Time   WBC 5.9 02/20/2021 1133   WBC 7.9 07/03/2014 1752   RBC 3.95 02/20/2021 1133   RBC 4.44 07/03/2014 1752   HGB 11.8 02/20/2021 1133   HCT 36.2 02/20/2021 1133   PLT 215 02/20/2021 1133   MCV 92 02/20/2021 1133   MCH 29.9 02/20/2021 1133   MCH 30.2 07/03/2014 1752   MCHC 32.6 02/20/2021 1133   MCHC 34.3 07/03/2014 1752   RDW 13.9 02/20/2021 1133   LYMPHSABS 1.9 02/20/2021 1133   MONOABS 0.5 06/08/2013 0139   EOSABS 0.1 02/20/2021 1133   BASOSABS 0.0 02/20/2021 1133    No results found for: HGBA1C   The 10-year ASCVD risk score (Arnett DK, et al., 2019) is: 9.6%   Values used to calculate the score:     Age: 65 years     Sex: Female     Is Non-Hispanic African American: No     Diabetic: No     Tobacco smoker: No     Systolic Blood Pressure: 177 mmHg     Is BP treated: Yes     HDL Cholesterol: 40 mg/dL      Total Cholesterol: 140 mg/dL  Assessment & Plan:  1. Muscle spasm Uncontrolled -could  explain left-sided pain Refilled meloxicam Placed on Voltaren gel Advised to apply heat - methocarbamol (ROBAXIN) 500 MG tablet; Take 1 tablet (500 mg total) by mouth every 8 (eight) hours as needed for muscle spasms.  Dispense: 60 tablet; Refill: 2  2. Essential hypertension Uncontrolled Increased dose of lisinopril/HCTZ Counseled on blood pressure goal of less than 130/80, low-sodium, DASH diet, medication compliance, 150 minutes of moderate intensity exercise per week. Discussed medication compliance, adverse effects. - lisinopril-hydrochlorothiazide (ZESTORETIC) 20-25 MG tablet; Take 1 tablet by mouth daily.  Dispense: 90 tablet; Refill: 1  3. Encounter for screening mammogram for malignant neoplasm of breast - MM DIGITAL SCREENING BILATERAL; Future  4. Screening for colon cancer - Fecal occult blood, imunochemical  5. Need for immunization against influenza - Flu Vaccine QUAD 28mo+IM (Fluarix, Fluzone & Alfiuria Quad PF)    Meds ordered this encounter  Medications   methocarbamol (ROBAXIN) 500 MG tablet    Sig: Take 1 tablet (500 mg total) by mouth every 8 (eight) hours as needed for muscle spasms.    Dispense:  60 tablet    Refill:  2   lisinopril-hydrochlorothiazide (ZESTORETIC) 20-25 MG tablet    Sig: Take 1 tablet by mouth daily.    Dispense:  90 tablet    Refill:  1    Follow-up: Return in about 2 weeks (around 08/27/2021) for Blood pressure follow-up with Lurena Joiner; 3 months PCP.       Charlott Rakes, MD, FAAFP. [facility] and [place], [place]   08/13/2021, 10:59 AM

## 2021-08-20 ENCOUNTER — Other Ambulatory Visit: Payer: Self-pay

## 2021-08-27 ENCOUNTER — Ambulatory Visit: Payer: Self-pay | Admitting: Pharmacist

## 2021-09-23 ENCOUNTER — Other Ambulatory Visit: Payer: Self-pay

## 2021-10-24 ENCOUNTER — Other Ambulatory Visit: Payer: Self-pay

## 2021-11-12 ENCOUNTER — Ambulatory Visit: Payer: Self-pay | Attending: Family Medicine | Admitting: Family Medicine

## 2021-11-12 ENCOUNTER — Other Ambulatory Visit: Payer: Self-pay

## 2021-11-12 ENCOUNTER — Encounter: Payer: Self-pay | Admitting: Family Medicine

## 2021-11-12 VITALS — BP 140/97 | HR 89 | Ht 65.0 in | Wt 153.6 lb

## 2021-11-12 DIAGNOSIS — R519 Headache, unspecified: Secondary | ICD-10-CM

## 2021-11-12 DIAGNOSIS — K219 Gastro-esophageal reflux disease without esophagitis: Secondary | ICD-10-CM

## 2021-11-12 DIAGNOSIS — Z13228 Encounter for screening for other metabolic disorders: Secondary | ICD-10-CM

## 2021-11-12 DIAGNOSIS — I10 Essential (primary) hypertension: Secondary | ICD-10-CM

## 2021-11-12 MED ORDER — CETIRIZINE HCL 10 MG PO TABS
10.0000 mg | ORAL_TABLET | Freq: Every day | ORAL | 1 refills | Status: AC
  Filled 2021-11-12: qty 30, 30d supply, fill #0

## 2021-11-12 MED ORDER — OMEPRAZOLE 20 MG PO CPDR
DELAYED_RELEASE_CAPSULE | Freq: Two times a day (BID) | ORAL | 6 refills | Status: DC
  Filled 2021-11-12: qty 60, fill #0
  Filled 2021-11-22: qty 60, 30d supply, fill #0
  Filled 2021-12-26: qty 60, 30d supply, fill #1
  Filled 2022-01-24: qty 120, 90d supply, fill #2
  Filled 2022-02-20: qty 60, 30d supply, fill #3
  Filled 2022-03-28: qty 60, 30d supply, fill #4
  Filled 2022-04-23: qty 60, 30d supply, fill #5

## 2021-11-12 NOTE — Progress Notes (Signed)
Subjective:  Patient ID: Ashley Jacobs, female    DOB: [birth year]  Age: 66 y.o. MRN: [id number]  CC: Hypertension   HPI Ashley Jacobs is a 66 y.o. year old female with a history of hypertension, GERD, chronic pain.  Seen with the aid of [employer] video interpreter  Interval History: She reports doing well but states she sometimes has headaches which is generalized and occurs intermittently.  Headache started this month and she also noticed sometimes she has facial pain.  Denies presence of postnasal drip, nausea, vomiting, blurry vision.  Compliant with her antihypertensive and reflux symptoms are controlled.  She states her chronic pain is controlled on her current regimen. Past Medical History:  Diagnosis Date   Anginal pain ([place]) 05/30/2012   Chronic pain    right hip   Hypertension    Leg DVT (deep venous thromboembolism), chronic ([place]) 2009; 06/02/2012   "always left"   Shortness of breath 05/30/2012   Tuberculosis ~ 1980   "took medicine for it 6 months"    Past Surgical History:  Procedure Laterality Date   VENA CAVA FILTER PLACEMENT  06/02/12    History reviewed. No pertinent family history.  No Known Allergies  Outpatient Medications Prior to Visit  Medication Sig Dispense Refill   gabapentin (NEURONTIN) 300 MG capsule TAKE 1 CAPSULE (300 MG TOTAL) BY MOUTH AT BEDTIME. 30 capsule 6   lisinopril-hydrochlorothiazide (ZESTORETIC) 20-25 MG tablet Take 1 tablet by mouth daily. 90 tablet 1   methocarbamol (ROBAXIN) 500 MG tablet Take 1 tablet (500 mg total) by mouth every 8 (eight) hours as needed for muscle spasms. 60 tablet 2   diclofenac Sodium (VOLTAREN) 1 % GEL Apply 4 g topically 4 (four) times daily. (Patient not taking: Reported on 11/12/2021) 100 g 1   acetaminophen (TYLENOL) 325 MG tablet Take 650 mg by mouth every 6 (six) hours as needed. (Patient not taking: Reported on 11/12/2021)     cetirizine (ZYRTEC) 10 MG tablet Take 1 tablet (10 mg total) by mouth daily.  (Patient not taking: Reported on 11/12/2021) 30 tablet 1   ibuprofen (ADVIL) 200 MG tablet Take 1 tablet (200 mg total) by mouth every 6 (six) hours as needed. (Patient not taking: Reported on 11/12/2021) 30 tablet 0   omeprazole (PRILOSEC) 20 MG capsule TAKE 1 CAPSULE (20 MG TOTAL) BY MOUTH 2 (TWO) TIMES DAILY BEFORE A MEAL. (Patient not taking: Reported on 11/12/2021) 60 capsule 3   triamcinolone cream (KENALOG) 0.1 % Apply 1 application topically 2 (two) times daily as needed for itching (Patient not taking: Reported on 11/12/2021) 30 g 0   No facility-administered medications prior to visit.     ROS Review of Systems  Constitutional:  Negative for activity change, appetite change and fatigue.  HENT:  Negative for congestion, sinus pressure and sore throat.   Eyes:  Negative for visual disturbance.  Respiratory:  Negative for cough, chest tightness, shortness of breath and wheezing.   Cardiovascular:  Negative for chest pain and palpitations.  Gastrointestinal:  Negative for abdominal distention, abdominal pain and constipation.  Endocrine: Negative for polydipsia.  Genitourinary:  Negative for dysuria and frequency.  Musculoskeletal:  Negative for arthralgias and back pain.  Skin:  Negative for rash.  Neurological:  Positive for headaches. Negative for tremors, light-headedness and numbness.  Hematological:  Does not bruise/bleed easily.  Psychiatric/Behavioral:  Negative for agitation and behavioral problems.    Objective:  BP (!) 140/97    Pulse 89  Ht _0  (1.651 m)    Wt 153 lb 9.6 oz (69.7 kg)    SpO2 99%    BMI 25.56 kg/m   BP/Weight 11/12/2021 08/13/2021 19/02/2228  Systolic BP 798 921 194  Diastolic BP 97 174 081  Wt. (Lbs) 153.6 148.2 145  BMI 25.56 24.66 26.95      Physical Exam Constitutional:      Appearance: She is well-developed.  Cardiovascular:     Rate and Rhythm: Normal rate.     Heart sounds: Normal heart sounds. No murmur heard. Pulmonary:     Effort:  Pulmonary effort is normal.     Breath sounds: Normal breath sounds. No wheezing or rales.  Chest:     Chest wall: No tenderness.  Abdominal:     General: Bowel sounds are normal. There is no distension.     Palpations: Abdomen is soft. There is no mass.     Tenderness: There is no abdominal tenderness.  Musculoskeletal:        General: Normal range of motion.     Right lower leg: No edema.     Left lower leg: No edema.  Neurological:     Mental Status: She is alert and oriented to person, place, and time.  Psychiatric:        Mood and Affect: Mood normal.    CMP Latest Ref Rng & Units 02/20/2021 10/15/2020 11/30/2019  Glucose 65 - 99 mg/dL 147(H) 96 89  BUN 8 - 27 mg/dL _1 Creatinine 0.57 - 1.00 mg/dL 0.76 0.76 0.74  Sodium 134 - 144 mmol/L 139 142 140  Potassium 3.5 - 5.2 mmol/L 4.0 3.9 3.7  Chloride 96 - 106 mmol/L 102 105 103  CO2 20 - 29 mmol/L 23 - 25  Calcium 8.7 - 10.3 mg/dL 9.1 9.2 9.1  Total Protein 6.0 - 8.5 g/dL 7.6 7.8 -  Total Bilirubin 0.0 - 1.2 mg/dL 0.6 0.6 -  Alkaline Phos 44 - 121 IU/L 103 107 -  AST 0 - 40 IU/L 26 22 -  ALT 0 - 32 IU/L 25 - -    Lipid Panel     Component Value Date/Time   CHOL 140 02/20/2021 1133   TRIG 105 02/20/2021 1133   HDL 40 02/20/2021 1133   CHOLHDL 3.5 02/20/2021 1133   CHOLHDL 4.0 10/17/2016 0836   VLDL 24 11/16/2015 1051   LDLCALC 81 02/20/2021 1133    CBC    Component Value Date/Time   WBC 5.9 02/20/2021 1133   WBC 7.9 07/03/2014 1752   RBC 3.95 02/20/2021 1133   RBC 4.44 07/03/2014 1752   HGB 11.8 02/20/2021 1133   HCT 36.2 02/20/2021 1133   PLT 215 02/20/2021 1133   MCV 92 02/20/2021 1133   MCH 29.9 02/20/2021 1133   MCH 30.2 07/03/2014 1752   MCHC 32.6 02/20/2021 1133   MCHC 34.3 07/03/2014 1752   RDW 13.9 02/20/2021 1133   LYMPHSABS 1.9 02/20/2021 1133   MONOABS 0.5 06/08/2013 0139   EOSABS 0.1 02/20/2021 1133   BASOSABS 0.0 02/20/2021 1133    No results found for: HGBA1C  Assessment & Plan:   1. Gastroesophageal reflux disease without esophagitis Controlled - omeprazole (PRILOSEC) 20 MG capsule; TAKE 1 CAPSULE (20 MG TOTAL) BY MOUTH 2 (TWO) TIMES DAILY BEFORE A MEAL.  Dispense: 60 capsule; Refill: 6  2. Essential hypertension Slight diastolic elevation No regimen changes today Counseled on blood pressure goal of less than 130/80, low-sodium, DASH diet, medication  compliance, 150 minutes of moderate intensity exercise per week. Discussed medication compliance, adverse effects. - CMP14+EGFR - LP+Non-HDL Cholesterol  3. Screening for metabolic disorder - Hemoglobin A1c  4. Sinus headache Could be due to the fact that the season is changing with moving closer to spring We will place on antihistamine - cetirizine (ZYRTEC) 10 MG tablet; Take 1 tablet (10 mg total) by mouth daily.  Dispense: 30 tablet; Refill: 1   Meds ordered this encounter  Medications   omeprazole (PRILOSEC) 20 MG capsule    Sig: TAKE 1 CAPSULE (20 MG TOTAL) BY MOUTH 2 (TWO) TIMES DAILY BEFORE A MEAL.    Dispense:  60 capsule    Refill:  6   cetirizine (ZYRTEC) 10 MG tablet    Sig: Take 1 tablet (10 mg total) by mouth daily.    Dispense:  30 tablet    Refill:  1    Follow-up: Return in about 6 months (around 05/12/2022) for Chronic medical conditions.       Charlott Rakes, MD, FAAFP. [facility] and [place], [place]   11/12/2021, 4:21 PM

## 2021-11-13 LAB — CMP14+EGFR
ALT: 33 IU/L — ABNORMAL HIGH (ref 0–32)
AST: 27 IU/L (ref 0–40)
Albumin/Globulin Ratio: 1 — ABNORMAL LOW (ref 1.2–2.2)
Albumin: 4 g/dL (ref 3.8–4.8)
Alkaline Phosphatase: 91 IU/L (ref 44–121)
BUN/Creatinine Ratio: 22 (ref 12–28)
BUN: 16 mg/dL (ref 8–27)
Bilirubin Total: 0.3 mg/dL (ref 0.0–1.2)
CO2: 26 mmol/L (ref 20–29)
Calcium: 9.1 mg/dL (ref 8.7–10.3)
Chloride: 100 mmol/L (ref 96–106)
Creatinine, Ser: 0.74 mg/dL (ref 0.57–1.00)
Globulin, Total: 3.9 g/dL (ref 1.5–4.5)
Glucose: 117 mg/dL — ABNORMAL HIGH (ref 70–99)
Potassium: 3.7 mmol/L (ref 3.5–5.2)
Sodium: 138 mmol/L (ref 134–144)
Total Protein: 7.9 g/dL (ref 6.0–8.5)
eGFR: 91 mL/min/{1.73_m2} (ref >59)

## 2021-11-13 LAB — LP+NON-HDL CHOLESTEROL
Cholesterol, Total: 143 mg/dL (ref 100–199)
HDL: 44 mg/dL (ref >39)
LDL Chol Calc (NIH): 79 mg/dL (ref 0–99)
Total Non-HDL-Chol (LDL+VLDL): 99 mg/dL (ref 0–129)
Triglycerides: 109 mg/dL (ref 0–149)
VLDL Cholesterol Cal: 20 mg/dL (ref 5–40)

## 2021-11-13 LAB — HEMOGLOBIN A1C
Est. average glucose Bld gHb Est-mCnc: 134 mg/dL
Hgb A1c MFr Bld: 6.3 % — ABNORMAL HIGH (ref 4.8–5.6)

## 2021-11-14 ENCOUNTER — Telehealth: Payer: Self-pay

## 2021-11-14 NOTE — Telephone Encounter (Signed)
Pt was called and VM was left informing patient to return phone call. ?

## 2021-11-14 NOTE — Progress Notes (Signed)
Left VM for patient to return phone call.

## 2021-11-14 NOTE — Telephone Encounter (Signed)
-----   Message from Hoy Register, MD sent at 11/13/2021  1:27 PM EST ----- ?Please inform the patient that labs are normal. Thank you. ?

## 2021-11-22 ENCOUNTER — Other Ambulatory Visit: Payer: Self-pay

## 2021-12-26 ENCOUNTER — Other Ambulatory Visit: Payer: Self-pay | Admitting: Physician Assistant

## 2021-12-26 ENCOUNTER — Other Ambulatory Visit: Payer: Self-pay

## 2021-12-26 DIAGNOSIS — G894 Chronic pain syndrome: Secondary | ICD-10-CM

## 2021-12-26 MED ORDER — GABAPENTIN 300 MG PO CAPS
ORAL_CAPSULE | Freq: Every day | ORAL | 6 refills | Status: DC
  Filled 2021-12-26: qty 30, 30d supply, fill #0
  Filled 2022-01-24: qty 30, 30d supply, fill #1
  Filled 2022-02-20: qty 30, 30d supply, fill #2
  Filled 2022-04-23: qty 30, 30d supply, fill #3
  Filled 2022-05-23: qty 30, 30d supply, fill #4
  Filled 2022-07-21: qty 30, 30d supply, fill #5
  Filled 2022-08-18: qty 30, 30d supply, fill #6

## 2021-12-26 NOTE — Telephone Encounter (Signed)
Requested Prescriptions  ?Pending Prescriptions Disp Refills  ?? gabapentin (NEURONTIN) 300 MG capsule 30 capsule 6  ?  Sig: TAKE 1 CAPSULE (300 MG TOTAL) BY MOUTH AT BEDTIME.  ?  ? Neurology: Anticonvulsants - gabapentin Passed - 12/26/2021 10:48 AM  ?  ?  Passed - Cr in normal range and within 360 days  ?  Creat  ?Date Value Ref Range Status  ?10/17/2016 0.76 0.50 - 1.05 mg/dL Final  ?  Comment:  ?    ?For patients > or = 64 years of age: The upper reference limit for ?Creatinine is approximately 13% higher for people identified as ?African-American. ?  ?  ? ?Creatinine, Ser  ?Date Value Ref Range Status  ?11/12/2021 0.74 0.57 - 1.00 mg/dL Final  ?   ?  ?  Passed - Completed PHQ-2 or PHQ-9 in the last 360 days  ?  ?  Passed - Valid encounter within last 12 months  ?  Recent Outpatient Visits   ?      ? 1 month ago Essential hypertension  ? [place], [place], MD  ? 4 months ago Encounter for screening mammogram for malignant neoplasm of breast  ? [place], Charlane Ferretti, MD  ? 5 months ago Acute pain of right knee  ? [place], [place]  ? 10 months ago Essential hypertension  ? [place], [place], [place]  ? 1 year ago Other insomnia  ? [facility] [place], PA-C  ?  ?  ?Future Appointments   ?        ? In 4 months Charlott Rakes, MD [place]  ?  ? ?  ?  ?  ? ? ?

## 2021-12-27 ENCOUNTER — Other Ambulatory Visit: Payer: Self-pay

## 2022-01-24 ENCOUNTER — Other Ambulatory Visit: Payer: Self-pay

## 2022-02-20 ENCOUNTER — Other Ambulatory Visit: Payer: Self-pay

## 2022-02-20 ENCOUNTER — Other Ambulatory Visit: Payer: Self-pay | Admitting: Family Medicine

## 2022-02-20 DIAGNOSIS — I10 Essential (primary) hypertension: Secondary | ICD-10-CM

## 2022-02-20 MED ORDER — LISINOPRIL-HYDROCHLOROTHIAZIDE 20-25 MG PO TABS
1.0000 | ORAL_TABLET | Freq: Every day | ORAL | 0 refills | Status: DC
  Filled 2022-02-20: qty 30, 30d supply, fill #0
  Filled 2022-03-28: qty 30, 30d supply, fill #1
  Filled 2022-04-23: qty 30, 30d supply, fill #2

## 2022-02-20 NOTE — Telephone Encounter (Signed)
Requested Prescriptions  Pending Prescriptions Disp Refills  . lisinopril-hydrochlorothiazide (ZESTORETIC) 20-25 MG tablet 90 tablet 0    Sig: Take 1 tablet by mouth daily.     Cardiovascular:  ACEI + Diuretic Combos Failed - 02/20/2022 10:41 AM      Failed - Last BP in normal range    BP Readings from Last 1 Encounters:  11/12/21 (!) 140/97         Passed - Na in normal range and within 180 days    Sodium  Date Value Ref Range Status  11/12/2021 138 134 - 144 mmol/L Final         Passed - K in normal range and within 180 days    Potassium  Date Value Ref Range Status  11/12/2021 3.7 3.5 - 5.2 mmol/L Final         Passed - Cr in normal range and within 180 days    Creat  Date Value Ref Range Status  10/17/2016 0.76 0.50 - 1.05 mg/dL Final    Comment:      For patients > or = 64 years of age: The upper reference limit for Creatinine is approximately 13% higher for people identified as African-American.      Creatinine, Ser  Date Value Ref Range Status  11/12/2021 0.74 0.57 - 1.00 mg/dL Final         Passed - eGFR is 30 or above and within 180 days    GFR, Est African American  Date Value Ref Range Status  10/17/2016 >89 >=60 mL/min Final   GFR calc Af Amer  Date Value Ref Range Status  10/15/2020 97 >59 mL/min/1.73 Final    Comment:    **In accordance with recommendations from the NKF-ASN Task force,**   [facility] is in the process of updating its eGFR calculation to the   2021 CKD-EPI creatinine equation that estimates kidney function   without a race variable.    GFR, Est Non African American  Date Value Ref Range Status  10/17/2016 87 >=60 mL/min Final   GFR calc non Af Amer  Date Value Ref Range Status  10/15/2020 84 >59 mL/min/1.73 Final   eGFR  Date Value Ref Range Status  11/12/2021 91 >59 mL/min/1.73 Final         Passed - Patient is not pregnant      Passed - Valid encounter within last 6 months    Recent Outpatient Visits          3  months ago Essential hypertension   [place], Charlane Ferretti, MD   6 months ago Encounter for screening mammogram for malignant neoplasm of breast   [place], Charlane Ferretti, MD   7 months ago Acute pain of right knee   [place], [place], [place]   1 year ago Essential hypertension   [place], [place]   1 year ago Other insomnia   [facility], Loraine Grip, PA-C      Future Appointments            In 2 months Charlott Rakes, MD [place]

## 2022-02-26 ENCOUNTER — Other Ambulatory Visit: Payer: Self-pay

## 2022-03-28 ENCOUNTER — Other Ambulatory Visit: Payer: Self-pay

## 2022-04-23 ENCOUNTER — Other Ambulatory Visit: Payer: Self-pay

## 2022-04-23 ENCOUNTER — Other Ambulatory Visit: Payer: Self-pay | Admitting: Family Medicine

## 2022-04-23 DIAGNOSIS — M62838 Other muscle spasm: Secondary | ICD-10-CM

## 2022-04-23 MED ORDER — METHOCARBAMOL 500 MG PO TABS
500.0000 mg | ORAL_TABLET | Freq: Three times a day (TID) | ORAL | 0 refills | Status: DC | PRN
  Filled 2022-04-23: qty 60, 20d supply, fill #0

## 2022-04-23 NOTE — Telephone Encounter (Signed)
Requested medications are due for refill today.  yes  Requested medications are on the active medications list.  yes  Last refill. 08/13/2021 #60 2 refills  Future visit scheduled.   yes  Notes to clinic.  Refill is not delegated.    Requested Prescriptions  Pending Prescriptions Disp Refills   methocarbamol (ROBAXIN) 500 MG tablet 60 tablet 2    Sig: Take 1 tablet (500 mg total) by mouth every 8 (eight) hours as needed for muscle spasms.     Not Delegated - Analgesics:  Muscle Relaxants Failed - 04/23/2022  1:14 PM      Failed - This refill cannot be delegated      Passed - Valid encounter within last 6 months    Recent Outpatient Visits           5 months ago Essential hypertension   [facility] Hoy Register, MD   8 months ago Encounter for screening mammogram for malignant neoplasm of breast   [facility] Hoy Register, MD   9 months ago Acute pain of right knee   [facility] [place], [place], [place]   1 year ago Essential hypertension   [place] [address] [facility] [place], [place], [place]   1 year ago Other insomnia   [facility], Kasandra Knudsen, PA-C       Future Appointments             In 2 weeks Hoy Register, MD [facility]

## 2022-05-13 ENCOUNTER — Ambulatory Visit: Payer: Self-pay | Admitting: Family Medicine

## 2022-05-23 ENCOUNTER — Other Ambulatory Visit: Payer: Self-pay | Admitting: Family Medicine

## 2022-05-23 ENCOUNTER — Other Ambulatory Visit: Payer: Self-pay

## 2022-05-23 DIAGNOSIS — K219 Gastro-esophageal reflux disease without esophagitis: Secondary | ICD-10-CM

## 2022-05-23 DIAGNOSIS — I10 Essential (primary) hypertension: Secondary | ICD-10-CM

## 2022-05-23 DIAGNOSIS — M62838 Other muscle spasm: Secondary | ICD-10-CM

## 2022-05-26 ENCOUNTER — Other Ambulatory Visit: Payer: Self-pay

## 2022-05-26 MED ORDER — METHOCARBAMOL 500 MG PO TABS
500.0000 mg | ORAL_TABLET | Freq: Three times a day (TID) | ORAL | 0 refills | Status: DC | PRN
  Filled 2022-05-26: qty 60, 20d supply, fill #0

## 2022-05-26 MED ORDER — LISINOPRIL-HYDROCHLOROTHIAZIDE 20-25 MG PO TABS
1.0000 | ORAL_TABLET | Freq: Every day | ORAL | 0 refills | Status: DC
  Filled 2022-05-26: qty 30, 30d supply, fill #0

## 2022-05-26 MED ORDER — OMEPRAZOLE 20 MG PO CPDR
20.0000 mg | DELAYED_RELEASE_CAPSULE | Freq: Two times a day (BID) | ORAL | 0 refills | Status: DC
  Filled 2022-05-26: qty 60, 30d supply, fill #0

## 2022-05-26 NOTE — Addendum Note (Signed)
Addended by: Wilford Corner on: 05/26/2022 02:21 PM   Modules accepted: Orders

## 2022-05-26 NOTE — Telephone Encounter (Addendum)
Pt son is calling to follow up on medication refills for pt. Pt son is asking for a courtesy refill to get pt through until her appointment on 06/19/2022.    No earlier appointments are available.      Please advise.

## 2022-05-26 NOTE — Telephone Encounter (Signed)
Courtesy refill given, appointment needed.   Requested Prescriptions  Pending Prescriptions Disp Refills  . lisinopril-hydrochlorothiazide (ZESTORETIC) 20-25 MG tablet 30 tablet 0    Sig: Take 1 tablet by mouth daily. OFFICE VISIT NEEDED FOR ADDITIONAL REFILLS     Cardiovascular:  ACEI + Diuretic Combos Failed - 05/26/2022  2:21 PM      Failed - Na in normal range and within 180 days    Sodium  Date Value Ref Range Status  11/12/2021 138 134 - 144 mmol/L Final         Failed - K in normal range and within 180 days    Potassium  Date Value Ref Range Status  11/12/2021 3.7 3.5 - 5.2 mmol/L Final         Failed - Cr in normal range and within 180 days    Creat  Date Value Ref Range Status  10/17/2016 0.76 0.50 - 1.05 mg/dL Final    Comment:      For patients > or = 64 years of age: The upper reference limit for Creatinine is approximately 13% higher for people identified as African-American.      Creatinine, Ser  Date Value Ref Range Status  11/12/2021 0.74 0.57 - 1.00 mg/dL Final         Failed - eGFR is 30 or above and within 180 days    GFR, Est African American  Date Value Ref Range Status  10/17/2016 >89 >=60 mL/min Final   GFR calc Af Amer  Date Value Ref Range Status  10/15/2020 97 >59 mL/min/1.73 Final    Comment:    **In accordance with recommendations from the NKF-ASN Task force,**   [facility] is in the process of updating its eGFR calculation to the   2021 CKD-EPI creatinine equation that estimates kidney function   without a race variable.    GFR, Est Non African American  Date Value Ref Range Status  10/17/2016 87 >=60 mL/min Final   GFR calc non Af Amer  Date Value Ref Range Status  10/15/2020 84 >59 mL/min/1.73 Final   eGFR  Date Value Ref Range Status  11/12/2021 91 >59 mL/min/1.73 Final         Failed - Last BP in normal range    BP Readings from Last 1 Encounters:  11/12/21 (!) 140/97         Failed - Valid encounter within last 6  months    Recent Outpatient Visits          6 months ago Essential hypertension   [place], Charlane Ferretti, MD   9 months ago Encounter for screening mammogram for malignant neoplasm of breast   [place], Charlane Ferretti, MD   10 months ago Acute pain of right knee   [place], [place], [place]   1 year ago Essential hypertension   [place], [place]   1 year ago Other insomnia   Primary Care at [facility], Loraine Grip, PA-C      Future Appointments            In 3 weeks Charlott Rakes, MD [place] - Patient is not pregnant      . methocarbamol (ROBAXIN) 500 MG tablet 60 tablet 0    Sig: Take 1 tablet (500 mg  total) by mouth every 8 (eight) hours as needed for muscle spasms.     Not Delegated - Analgesics:  Muscle Relaxants Failed - 05/26/2022  2:21 PM      Failed - This refill cannot be delegated      Failed - Valid encounter within last 6 months    Recent Outpatient Visits          6 months ago Essential hypertension   [place], Enobong, MD   9 months ago Encounter for screening mammogram for malignant neoplasm of breast   [place], Enobong, MD   10 months ago Acute pain of right knee   [place], [place], [place]   1 year ago Essential hypertension   [place], [place]   1 year ago Other insomnia   Primary Care at [facility], Loraine Grip, PA-C      Future Appointments            In 3 weeks Charlott Rakes, MD [place]           . omeprazole (PRILOSEC) 20 MG capsule 60 capsule 0    Sig: Take 1 capsule (20 mg total) by mouth 2 (two) times daily before a meal.  OFFICE VISIT NEEDED FOR ADDITIONAL REFILLS     Gastroenterology: Proton Pump Inhibitors Passed - 05/26/2022  2:21 PM      Passed - Valid encounter within last 12 months    Recent Outpatient Visits          6 months ago Essential hypertension   [place], Charlane Ferretti, MD   9 months ago Encounter for screening mammogram for malignant neoplasm of breast   [place], Charlane Ferretti, MD   10 months ago Acute pain of right knee   [place], [place], [place]   1 year ago Essential hypertension   [place], [place]   1 year ago Other insomnia   Primary Care at [facility], Loraine Grip, PA-C      Future Appointments            In 3 weeks Charlott Rakes, MD [place]  . methocarbamol (ROBAXIN) 500 MG tablet 60 tablet 0    Sig: Take 1 tablet (500 mg total) by mouth every 8 (eight) hours as needed for muscle spasms.     Not Delegated - Analgesics:  Muscle Relaxants Failed - 05/26/2022  2:21 PM      Failed - This refill cannot be delegated      Failed - Valid encounter within last 6 months    Recent Outpatient Visits          6 months ago Essential hypertension   [place], Enobong, MD   9 months ago Encounter for screening mammogram for malignant neoplasm of breast   [place], Enobong, MD   10 months ago Acute pain of right knee   [place], [place]   1 year ago Essential hypertension   [place], [place], [place]  1 year ago Other insomnia   Primary Care at [facility], Loraine Grip, PA-C      Future Appointments            In 3 weeks Charlott Rakes, MD [place]           . omeprazole (PRILOSEC) 20 MG capsule 60 capsule 6    Sig: TAKE 1 CAPSULE (20 MG TOTAL) BY MOUTH 2 (TWO) TIMES DAILY BEFORE A MEAL.     Gastroenterology: Proton Pump Inhibitors Passed - 05/26/2022  2:21 PM      Passed - Valid encounter within last 12 months    Recent Outpatient Visits          6 months ago Essential hypertension   [place], Enobong, MD   9 months ago Encounter for screening mammogram for malignant neoplasm of breast   [place], Enobong, MD   10 months ago Acute pain of right knee   [place] May[place], C[place], [place]   1 year ago Essential hypertension   [place], [place]   1 year ago Other insomnia   Primary Care at [facility], Loraine Grip, PA-C      Future Appointments            In 3 weeks Charlott Rakes, MD [place]           . lisinopril-hydrochlorothiazide (ZESTORETIC) 20-25 MG tablet 90 tablet 0    Sig: Take 1 tablet by mouth once daily.     Cardiovascular:  ACEI + Diuretic Combos Failed - 05/26/2022  2:21 PM      Failed - Na in normal range and within 180 days    Sodium  Date Value Ref Range Status  11/12/2021 138 134 - 144 mmol/L Final         Failed - K in normal range and within 180 days    Potassium  Date Value Ref Range Status  11/12/2021 3.7 3.5 - 5.2 mmol/L Final         Failed - Cr in normal range and within 180 days    Creat  Date Value Ref Range Status  10/17/2016 0.76 0.50 - 1.05 mg/dL Final    Comment:      For patients > or = 64 years of age: The upper reference limit for Creatinine is approximately 13% higher for people identified as African-American.      Creatinine, Ser  Date Value Ref Range Status  11/12/2021 0.74 0.57 - 1.00 mg/dL Final         Failed - eGFR is 30 or above and within 180 days     GFR, Est African American  Date Value Ref Range Status  10/17/2016 >89 >=60 mL/min Final   GFR calc Af Amer  Date Value Ref Range Status  10/15/2020 97 >59 mL/min/1.73 Final    Comment:    **In accordance with recommendations from the NKF-ASN Task force,**   [facility] is in the process of updating its eGFR calculation to the   2021 CKD-EPI creatinine equation that estimates kidney function   without a race variable.    GFR, Est Non African American  Date Value Ref Range Status  10/17/2016 87 >=60 mL/min Final   GFR calc non Af Amer  Date Value Ref Range Status  10/15/2020  84 >59 mL/min/1.73 Final   eGFR  Date Value Ref Range Status  11/12/2021 91 >59 mL/min/1.73 Final         Failed - Last BP in normal range    BP Readings from Last 1 Encounters:  11/12/21 (!) 140/97         Failed - Valid encounter within last 6 months    Recent Outpatient Visits          6 months ago Essential hypertension   [place], Charlane Ferretti, MD   9 months ago Encounter for screening mammogram for malignant neoplasm of breast   [place], Charlane Ferretti, MD   10 months ago Acute pain of right knee   [place] May[place], [place], [place]   1 year ago Essential hypertension   [place], [place]   1 year ago Other insomnia   Primary Care at [facility], Loraine Grip, PA-C      Future Appointments            In 3 weeks Charlott Rakes, MD [place] - Patient is not pregnant

## 2022-05-26 NOTE — Addendum Note (Signed)
Addended by: Lois Huxley, Jeannett Senior L on: 05/26/2022 04:29 PM   Modules accepted: Orders

## 2022-05-26 NOTE — Telephone Encounter (Signed)
Requested medication (s) are due for refill today: Yes  Requested medication (s) are on the active medication list:Yes  Last refill:  04/23/22  Future visit scheduled: Yes  Notes to clinic:  Unable to refill per protocol, cannot delegate. Requesting courtesy refill until upcoming appointment     Requested Prescriptions  Pending Prescriptions Disp Refills   methocarbamol (ROBAXIN) 500 MG tablet 60 tablet 0    Sig: Take 1 tablet (500 mg total) by mouth every 8 (eight) hours as needed for muscle spasms.     Not Delegated - Analgesics:  Muscle Relaxants Failed - 05/26/2022  2:21 PM      Failed - This refill cannot be delegated      Failed - Valid encounter within last 6 months    Recent Outpatient Visits           6 months ago Essential hypertension   [place], Enobong, MD   9 months ago Encounter for screening mammogram for malignant neoplasm of breast   [place], Enobong, MD   10 months ago Acute pain of right knee   [place], [place], [place]   1 year ago Essential hypertension   [place], [place], [place]   1 year ago Other insomnia   [facility] at [facility], Loraine Grip, PA-C       Future Appointments             In 3 weeks Charlott Rakes, MD [place]            Signed Prescriptions Disp Refills   lisinopril-hydrochlorothiazide (ZESTORETIC) 20-25 MG tablet 30 tablet 0    Sig: Take 1 tablet by mouth daily. OFFICE VISIT NEEDED FOR ADDITIONAL REFILLS     Cardiovascular:  ACEI + Diuretic Combos Failed - 05/26/2022  2:21 PM      Failed - Na in normal range and within 180 days    Sodium  Date Value Ref Range Status  11/12/2021 138 134 - 144 mmol/L Final         Failed - K in normal range and within 180 days    Potassium  Date Value Ref Range Status  11/12/2021 3.7 3.5  - 5.2 mmol/L Final         Failed - Cr in normal range and within 180 days    Creat  Date Value Ref Range Status  10/17/2016 0.76 0.50 - 1.05 mg/dL Final    Comment:      For patients > or = 64 years of age: The upper reference limit for Creatinine is approximately 13% higher for people identified as African-American.      Creatinine, Ser  Date Value Ref Range Status  11/12/2021 0.74 0.57 - 1.00 mg/dL Final         Failed - eGFR is 30 or above and within 180 days    GFR, Est African American  Date Value Ref Range Status  10/17/2016 >89 >=60 mL/min Final   GFR calc Af Amer  Date Value Ref Range Status  10/15/2020 97 >59 mL/min/1.73 Final    Comment:    **In accordance with recommendations from the NKF-ASN Task force,**   [facility] is in the process of updating its eGFR calculation to the   2021 CKD-EPI creatinine equation that estimates kidney function   without a race variable.  GFR, Est Non African American  Date Value Ref Range Status  10/17/2016 87 >=60 mL/min Final   GFR calc non Af Amer  Date Value Ref Range Status  10/15/2020 84 >59 mL/min/1.73 Final   eGFR  Date Value Ref Range Status  11/12/2021 91 >59 mL/min/1.73 Final         Failed - Last BP in normal range    BP Readings from Last 1 Encounters:  11/12/21 (!) 140/97         Failed - Valid encounter within last 6 months    Recent Outpatient Visits           6 months ago Essential hypertension   [place], Charlane Ferretti, MD   9 months ago Encounter for screening mammogram for malignant neoplasm of breast   [place], Charlane Ferretti, MD   10 months ago Acute pain of right knee   [place], Cari S, [place]   1 year ago Essential hypertension   [place], [place], [place]   1 year ago Other insomnia   Primary Care at [facility], Cari S, PA-C        Future Appointments             In 3 weeks Charlott Rakes, MD [place] - Patient is not pregnant       omeprazole (PRILOSEC) 20 MG capsule 60 capsule 0    Sig: Take 1 capsule (20 mg total) by mouth 2 (two) times daily before a meal. OFFICE VISIT NEEDED FOR ADDITIONAL REFILLS     Gastroenterology: Proton Pump Inhibitors Passed - 05/26/2022  2:21 PM      Passed - Valid encounter within last 12 months    Recent Outpatient Visits           6 months ago Essential hypertension   [place], Charlane Ferretti, MD   9 months ago Encounter for screening mammogram for malignant neoplasm of breast   [place], Charlane Ferretti, MD   10 months ago Acute pain of right knee   [place], [place], [place]   1 year ago Essential hypertension   [place], [place], [place]   1 year ago Other insomnia   Primary Care at [facility], Loraine Grip, PA-C       Future Appointments             In 3 weeks Charlott Rakes, MD [employer]            Refused Prescriptions Disp Refills   methocarbamol (ROBAXIN) 500 MG tablet 60 tablet 0    Sig: Take 1 tablet (500 mg total) by mouth every 8 (eight) hours as needed for muscle spasms.     Not Delegated - Analgesics:  Muscle Relaxants Failed - 05/26/2022  2:21 PM      Failed - This refill cannot be delegated      Failed - Valid encounter within last 6 months    Recent Outpatient Visits           6 months ago Essential hypertension   [place], Enobong, MD   9 months ago  Encounter for screening mammogram for malignant neoplasm of breast   [place], Charlane Ferretti, MD   10 months ago Acute pain of right knee   [place]  [place], Cari S, [place]   1 year ago Essential hypertension   [place], [place], [place]   1 year ago Other insomnia   Primary Care at [facility], Loraine Grip, PA-C       Future Appointments             In 3 weeks Charlott Rakes, MD [place]             omeprazole (PRILOSEC) 20 MG capsule 60 capsule 6    Sig: TAKE 1 CAPSULE (20 MG TOTAL) BY MOUTH 2 (TWO) TIMES DAILY BEFORE A MEAL.     Gastroenterology: Proton Pump Inhibitors Passed - 05/26/2022  2:21 PM      Passed - Valid encounter within last 12 months    Recent Outpatient Visits           6 months ago Essential hypertension   [place], Enobong, MD   9 months ago Encounter for screening mammogram for malignant neoplasm of breast   [place], Enobong, MD   10 months ago Acute pain of right knee   [place], Loraine Grip, [place]   1 year ago Essential hypertension   [place], [place], [place]   1 year ago Other insomnia   Primary Care at [facility], Cari S, PA-C       Future Appointments             In 3 weeks Charlott Rakes, MD [place]             lisinopril-hydrochlorothiazide (ZESTORETIC) 20-25 MG tablet 90 tablet 0    Sig: Take 1 tablet by mouth once daily.     Cardiovascular:  ACEI + Diuretic Combos Failed - 05/26/2022  2:21 PM      Failed - Na in normal range and within 180 days    Sodium  Date Value Ref Range Status  11/12/2021 138 134 - 144 mmol/L Final         Failed - K in normal range and within 180 days    Potassium  Date Value Ref Range Status  11/12/2021 3.7 3.5 - 5.2 mmol/L Final         Failed - Cr in normal range and within 180 days    Creat  Date Value Ref Range Status  10/17/2016 0.76 0.50 - 1.05 mg/dL Final     Comment:      For patients > or = 64 years of age: The upper reference limit for Creatinine is approximately 13% higher for people identified as African-American.      Creatinine, Ser  Date Value Ref Range Status  11/12/2021 0.74 0.57 - 1.00 mg/dL Final         Failed - eGFR is 30 or above and within 180 days    GFR, Est African American  Date Value Ref Range Status  10/17/2016 >89 >=60 mL/min Final   GFR calc Af Amer  Date Value Ref Range Status  10/15/2020 97 >59 mL/min/1.73 Final    Comment:    **In accordance with recommendations  from the NKF-ASN Task force,**   [facility] is in the process of updating its eGFR calculation to the   2021 CKD-EPI creatinine equation that estimates kidney function   without a race variable.    GFR, Est Non African American  Date Value Ref Range Status  10/17/2016 87 >=60 mL/min Final   GFR calc non Af Amer  Date Value Ref Range Status  10/15/2020 84 >59 mL/min/1.73 Final   eGFR  Date Value Ref Range Status  11/12/2021 91 >59 mL/min/1.73 Final         Failed - Last BP in normal range    BP Readings from Last 1 Encounters:  11/12/21 (!) 140/97         Failed - Valid encounter within last 6 months    Recent Outpatient Visits           6 months ago Essential hypertension   [place], Charlane Ferretti, MD   9 months ago Encounter for screening mammogram for malignant neoplasm of breast   [place], Charlane Ferretti, MD   10 months ago Acute pain of right knee   [place] May[place], [place], [place]   1 year ago Essential hypertension   [place], [place]   1 year ago Other insomnia   Primary Care at [facility], Loraine Grip, PA-C       Future Appointments             In 3 weeks Charlott Rakes, MD [place] - Patient is not pregnant

## 2022-06-02 ENCOUNTER — Other Ambulatory Visit: Payer: Self-pay

## 2022-06-19 ENCOUNTER — Other Ambulatory Visit: Payer: Self-pay

## 2022-06-19 ENCOUNTER — Ambulatory Visit: Payer: Self-pay | Attending: Family Medicine | Admitting: Family Medicine

## 2022-06-19 ENCOUNTER — Encounter: Payer: Self-pay | Admitting: Family Medicine

## 2022-06-19 VITALS — BP 121/82 | HR 81 | Temp 98.4°F | Wt 141.8 lb

## 2022-06-19 DIAGNOSIS — M62838 Other muscle spasm: Secondary | ICD-10-CM

## 2022-06-19 DIAGNOSIS — R7303 Prediabetes: Secondary | ICD-10-CM

## 2022-06-19 DIAGNOSIS — Z23 Encounter for immunization: Secondary | ICD-10-CM

## 2022-06-19 DIAGNOSIS — M5442 Lumbago with sciatica, left side: Secondary | ICD-10-CM

## 2022-06-19 DIAGNOSIS — K219 Gastro-esophageal reflux disease without esophagitis: Secondary | ICD-10-CM

## 2022-06-19 DIAGNOSIS — M5441 Lumbago with sciatica, right side: Secondary | ICD-10-CM

## 2022-06-19 DIAGNOSIS — G8929 Other chronic pain: Secondary | ICD-10-CM

## 2022-06-19 DIAGNOSIS — I10 Essential (primary) hypertension: Secondary | ICD-10-CM

## 2022-06-19 LAB — POCT GLYCOSYLATED HEMOGLOBIN (HGB A1C): HbA1c, POC (controlled diabetic range): 6.1 % (ref 0.0–7.0)

## 2022-06-19 MED ORDER — LIDOCAINE 5 % EX PTCH
1.0000 | MEDICATED_PATCH | CUTANEOUS | 3 refills | Status: DC
  Filled 2022-06-19: qty 30, 30d supply, fill #0

## 2022-06-19 MED ORDER — OMEPRAZOLE 20 MG PO CPDR
20.0000 mg | DELAYED_RELEASE_CAPSULE | Freq: Two times a day (BID) | ORAL | 6 refills | Status: DC
  Filled 2022-06-19: qty 60, 30d supply, fill #0
  Filled 2022-07-21 (×2): qty 60, 30d supply, fill #1
  Filled 2022-08-18: qty 60, 30d supply, fill #2
  Filled 2022-09-30: qty 60, 30d supply, fill #3
  Filled 2022-10-22 (×2): qty 60, 30d supply, fill #4
  Filled 2022-12-01: qty 60, 30d supply, fill #5
  Filled 2022-12-25: qty 60, 30d supply, fill #6

## 2022-06-19 MED ORDER — METHOCARBAMOL 500 MG PO TABS
500.0000 mg | ORAL_TABLET | Freq: Three times a day (TID) | ORAL | 2 refills | Status: DC | PRN
  Filled 2022-06-19: qty 60, 20d supply, fill #0
  Filled 2022-07-21: qty 60, 20d supply, fill #1
  Filled 2022-11-18: qty 60, 20d supply, fill #2

## 2022-06-19 MED ORDER — LISINOPRIL-HYDROCHLOROTHIAZIDE 20-25 MG PO TABS
1.0000 | ORAL_TABLET | Freq: Every day | ORAL | 6 refills | Status: DC
  Filled 2022-06-19: qty 30, 30d supply, fill #0
  Filled 2022-07-21: qty 30, 30d supply, fill #1
  Filled 2022-08-18: qty 30, 30d supply, fill #2
  Filled 2022-09-19: qty 30, 30d supply, fill #3
  Filled 2022-10-22 (×2): qty 30, 30d supply, fill #4
  Filled 2022-11-18: qty 30, 30d supply, fill #5
  Filled 2022-12-25: qty 30, 30d supply, fill #6

## 2022-06-19 MED ORDER — DICLOFENAC SODIUM 1 % EX GEL
4.0000 g | Freq: Four times a day (QID) | CUTANEOUS | 1 refills | Status: DC
  Filled 2022-06-19: qty 100, 7d supply, fill #0
  Filled 2023-01-21 (×3): qty 100, 7d supply, fill #1

## 2022-06-19 NOTE — Progress Notes (Signed)
Pain in back and neck 

## 2022-06-19 NOTE — Progress Notes (Signed)
Subjective:  Patient ID: Ashley Jacobs, female    DOB: 1955/11/19  Age: 66 y.o. MRN: [id number]  CC: Hypertension   HPI Ashley Jacobs is a 66 y.o. year old female with a history of hypertension, GERD, chronic pain  Interval History:  She Complains of chronic lower back which sometimes radiates down her legs but at the moment does not radiate. Rated as mild but gets worse on lifting and is intermittent. Her med list reveals she should be on Robaxin, Gabapentin, Voltaren gel however compliance with these is uncertain.  Endorses adherence with her antihypertensive and her PPI. Accompanied by her son to today's visit.  Past Medical History:  Diagnosis Date   Anginal pain ([place]) 05/30/2012   Chronic pain    right hip   Hypertension    Leg DVT (deep venous thromboembolism), chronic ([place]) 2009; 06/02/2012   "always left"   Shortness of breath 05/30/2012   Tuberculosis ~ 1980   "took medicine for it 6 months"    Past Surgical History:  Procedure Laterality Date   VENA CAVA FILTER PLACEMENT  06/02/12    History reviewed. No pertinent family history.  Social History   Socioeconomic History   Marital status: Married    Spouse name: Not on file   Number of children: Not on file   Years of education: Not on file   Highest education level: Not on file  Occupational History   Not on file  Tobacco Use   Smoking status: Never   Smokeless tobacco: Never  Substance and Sexual Activity   Alcohol use: No   Drug use: No   Sexual activity: Never  Other Topics Concern   Not on file  Social History Narrative   Not on file   Social Determinants of Health   Financial Resource Strain: Not on file  Food Insecurity: Not on file  Transportation Needs: Not on file  Physical Activity: Not on file  Stress: Not on file  Social Connections: Not on file    No Known Allergies  Outpatient Medications Prior to Visit  Medication Sig Dispense Refill   cetirizine (ZYRTEC) 10 MG tablet  Take 1 tablet (10 mg total) by mouth daily. 30 tablet 1   gabapentin (NEURONTIN) 300 MG capsule TAKE 1 CAPSULE (300 MG TOTAL) BY MOUTH AT BEDTIME. 30 capsule 6   lisinopril-hydrochlorothiazide (ZESTORETIC) 20-25 MG tablet Take 1 tablet by mouth daily. OFFICE VISIT NEEDED FOR ADDITIONAL REFILLS 30 tablet 0   methocarbamol (ROBAXIN) 500 MG tablet Take 1 tablet (500 mg total) by mouth every 8 (eight) hours as needed for muscle spasms. 60 tablet 0   omeprazole (PRILOSEC) 20 MG capsule Take 1 capsule (20 mg total) by mouth 2 (two) times daily before a meal. OFFICE VISIT NEEDED FOR ADDITIONAL REFILLS 60 capsule 0   diclofenac Sodium (VOLTAREN) 1 % GEL Apply 4 g topically 4 (four) times daily. (Patient not taking: Reported on 11/12/2021) 100 g 1   No facility-administered medications prior to visit.     ROS Review of Systems  Constitutional:  Negative for activity change and appetite change.  HENT:  Negative for sinus pressure and sore throat.   Respiratory:  Negative for chest tightness, shortness of breath and wheezing.   Cardiovascular:  Negative for chest pain and palpitations.  Gastrointestinal:  Negative for abdominal distention, abdominal pain and constipation.  Genitourinary: Negative.   Musculoskeletal:  Positive for back pain.  Psychiatric/Behavioral:  Negative for behavioral problems and dysphoric mood.  Objective:  BP 121/82   Pulse 81   Temp 98.4 F (36.9 C) (Oral)   Wt 141 lb 12.8 oz (64.3 kg)   SpO2 98%   BMI 23.60 kg/m      06/19/2022    8:36 AM 11/12/2021    3:50 PM 08/13/2021    9:40 AM  BP/Weight  Systolic BP 597 416 384  Diastolic BP 82 97 536  Wt. (Lbs) 141.8 153.6 148.2  BMI 23.6 kg/m2 25.56 kg/m2 24.66 kg/m2      Physical Exam Constitutional:      Appearance: She is well-developed.  Cardiovascular:     Rate and Rhythm: Normal rate.     Heart sounds: Normal heart sounds. No murmur heard. Pulmonary:     Effort: Pulmonary effort is normal.      Breath sounds: Normal breath sounds. No wheezing or rales.  Chest:     Chest wall: No tenderness.  Abdominal:     General: Bowel sounds are normal. There is no distension.     Palpations: Abdomen is soft. There is no mass.     Tenderness: There is no abdominal tenderness.  Musculoskeletal:     Right lower leg: No edema.     Left lower leg: No edema.     Comments: Slight tenderness across lumbar region.  Negative straight leg raise bilaterally  Neurological:     Mental Status: She is alert and oriented to person, place, and time.  Psychiatric:        Mood and Affect: Mood normal.        Latest Ref Rng & Units 11/12/2021    4:26 PM 02/20/2021   11:33 AM 10/15/2020    8:52 AM  CMP  Glucose 70 - 99 mg/dL 117  147  96   BUN 8 - 27 mg/dL '16  10  9   ' Creatinine 0.57 - 1.00 mg/dL 0.74  0.76  0.76   Sodium 134 - 144 mmol/L 138  139  142   Potassium 3.5 - 5.2 mmol/L 3.7  4.0  3.9   Chloride 96 - 106 mmol/L 100  102  105   CO2 20 - 29 mmol/L 26  23    Calcium 8.7 - 10.3 mg/dL 9.1  9.1  9.2   Total Protein 6.0 - 8.5 g/dL 7.9  7.6  7.8   Total Bilirubin 0.0 - 1.2 mg/dL 0.3  0.6  0.6   Alkaline Phos 44 - 121 IU/L 91  103  107   AST 0 - 40 IU/L '27  26  22   ' ALT 0 - 32 IU/L 33  25      Lipid Panel     Component Value Date/Time   CHOL 143 11/12/2021 1626   TRIG 109 11/12/2021 1626   HDL 44 11/12/2021 1626   CHOLHDL 3.5 02/20/2021 1133   CHOLHDL 4.0 10/17/2016 0836   VLDL 24 11/16/2015 1051   LDLCALC 79 11/12/2021 1626    CBC    Component Value Date/Time   WBC 5.9 02/20/2021 1133   WBC 7.9 07/03/2014 1752   RBC 3.95 02/20/2021 1133   RBC 4.44 07/03/2014 1752   HGB 11.8 02/20/2021 1133   HCT 36.2 02/20/2021 1133   PLT 215 02/20/2021 1133   MCV 92 02/20/2021 1133   MCH 29.9 02/20/2021 1133   MCH 30.2 07/03/2014 1752   MCHC 32.6 02/20/2021 1133   MCHC 34.3 07/03/2014 1752   RDW 13.9 02/20/2021 1133   LYMPHSABS 1.9 02/20/2021 1133  MONOABS 0.5 06/08/2013 0139   EOSABS 0.1  02/20/2021 1133   BASOSABS 0.0 02/20/2021 1133    Lab Results  Component Value Date   HGBA1C 6.1 06/19/2022    The 10-year ASCVD risk score (Arnett DK, et al., 2019) is: 4.9%   Values used to calculate the score:     Age: 66 years     Sex: Female     Is Non-Hispanic African American: No     Diabetic: No     Tobacco smoker: No     Systolic Blood Pressure: 161 mmHg     Is BP treated: Yes     HDL Cholesterol: 44 mg/dL     Total Cholesterol: 143 mg/dL  Assessment & Plan:  1. Essential hypertension Controlled Counseled on blood pressure goal of less than 130/80, low-sodium, DASH diet, medication compliance, 150 minutes of moderate intensity exercise per week. Discussed medication compliance, adverse effects.  - lisinopril-hydrochlorothiazide (ZESTORETIC) 20-25 MG tablet; Take 1 tablet by mouth daily.  Dispense: 30 tablet; Refill: 6 - CMP14+EGFR  2. Prediabetes Labs reveal prediabetes with an A1c of 6.1.  Working on a low carbohydrate diet, exercise, weight loss is recommended in order to prevent progression to type 2 diabetes mellitus.  - POCT glycosylated hemoglobin (Hb A1C)  3. Gastroesophageal reflux disease without esophagitis Stable - omeprazole (PRILOSEC) 20 MG capsule; Take 1 capsule (20 mg total) by mouth 2 (two) times daily before a meal.  Dispense: 60 capsule; Refill: 6  4. Muscle spasm - methocarbamol (ROBAXIN) 500 MG tablet; Take 1 tablet (500 mg total) by mouth every 8 (eight) hours as needed for muscle spasms.  Dispense: 60 tablet; Refill: 2  5. Chronic midline low back pain with bilateral sciatica Possible underlying degenerative disc disease Will benefit from PT however she has no medical coverage and currently does not have the "financial assistance Lidoderm patch added and chronic medications have been refilled - lidocaine (LIDODERM) 5 %; Place 1 patch onto the skin daily. Remove & Discard patch within 12 hours or as directed by MD  Dispense: 30 patch;  Refill: 3 - diclofenac Sodium (VOLTAREN) 1 % GEL; Apply 4 g topically 4 (four) times daily.  Dispense: 100 g; Refill: 1  6. Need for immunization against influenza - Flu Vaccine QUAD 78moIM (Fluarix, Fluzone & Alfiuria Quad PF)  [place] desk.  Health Care Maintenance: We will address at next visit Meds ordered this encounter  Medications   lidocaine (LIDODERM) 5 %    Sig: Place 1 patch onto the skin daily. Remove & Discard patch within 12 hours or as directed by MD    Dispense:  30 patch    Refill:  3   lisinopril-hydrochlorothiazide (ZESTORETIC) 20-25 MG tablet    Sig: Take 1 tablet by mouth daily.    Dispense:  30 tablet    Refill:  6   omeprazole (PRILOSEC) 20 MG capsule    Sig: Take 1 capsule (20 mg total) by mouth 2 (two) times daily before a meal.    Dispense:  60 capsule    Refill:  6   methocarbamol (ROBAXIN) 500 MG tablet    Sig: Take 1 tablet (500 mg total) by mouth every 8 (eight) hours as needed for muscle spasms.    Dispense:  60 tablet    Refill:  2   diclofenac Sodium (VOLTAREN) 1 % GEL    Sig: Apply 4 g topically 4 (four)  times daily.    Dispense:  100 g    Refill:  1    Follow-up: Return in about 1 month (around 07/20/2022) for Pap smear, CPE/ Preventive Health Exam.       Charlott Rakes, MD, FAAFP. [facility] [place], [place]   06/19/2022, 12:59 PM

## 2022-06-19 NOTE — Patient Instructions (Signed)

## 2022-06-20 ENCOUNTER — Telehealth: Payer: Self-pay

## 2022-06-20 LAB — CMP14+EGFR
ALT: 26 IU/L (ref 0–32)
AST: 25 IU/L (ref 0–40)
Albumin/Globulin Ratio: 1.2 (ref 1.2–2.2)
Albumin: 4.1 g/dL (ref 3.9–4.9)
Alkaline Phosphatase: 100 IU/L (ref 44–121)
BUN/Creatinine Ratio: 9 — ABNORMAL LOW (ref 12–28)
BUN: 8 mg/dL (ref 8–27)
Bilirubin Total: 0.6 mg/dL (ref 0.0–1.2)
CO2: 24 mmol/L (ref 20–29)
Calcium: 9.6 mg/dL (ref 8.7–10.3)
Chloride: 102 mmol/L (ref 96–106)
Creatinine, Ser: 0.85 mg/dL (ref 0.57–1.00)
Globulin, Total: 3.5 g/dL (ref 1.5–4.5)
Glucose: 89 mg/dL (ref 70–99)
Potassium: 4 mmol/L (ref 3.5–5.2)
Sodium: 141 mmol/L (ref 134–144)
Total Protein: 7.6 g/dL (ref 6.0–8.5)
eGFR: 77 mL/min/{1.73_m2} (ref >59)

## 2022-06-25 NOTE — Telephone Encounter (Signed)
error 

## 2022-07-21 ENCOUNTER — Other Ambulatory Visit: Payer: Self-pay

## 2022-07-31 ENCOUNTER — Ambulatory Visit: Payer: Self-pay | Attending: Family Medicine | Admitting: Family Medicine

## 2022-07-31 ENCOUNTER — Other Ambulatory Visit (HOSPITAL_COMMUNITY)
Admission: RE | Admit: 2022-07-31 | Discharge: 2022-07-31 | Disposition: A | Discharge status: Home / Self Care | Payer: Self-pay | Source: Ambulatory Visit | Attending: Family Medicine | Admitting: Family Medicine

## 2022-07-31 ENCOUNTER — Other Ambulatory Visit: Payer: Self-pay

## 2022-07-31 ENCOUNTER — Encounter: Payer: Self-pay | Admitting: Family Medicine

## 2022-07-31 VITALS — BP 117/79 | HR 97 | Temp 98.0°F | Ht 60.0 in | Wt 145.6 lb

## 2022-07-31 DIAGNOSIS — L299 Pruritus, unspecified: Secondary | ICD-10-CM

## 2022-07-31 DIAGNOSIS — Z124 Encounter for screening for malignant neoplasm of cervix: Secondary | ICD-10-CM

## 2022-07-31 DIAGNOSIS — Z1231 Encounter for screening mammogram for malignant neoplasm of breast: Secondary | ICD-10-CM

## 2022-07-31 DIAGNOSIS — Z1211 Encounter for screening for malignant neoplasm of colon: Secondary | ICD-10-CM

## 2022-07-31 DIAGNOSIS — Z0001 Encounter for general adult medical examination with abnormal findings: Secondary | ICD-10-CM

## 2022-07-31 MED ORDER — ZOSTER VAC RECOMB ADJUVANTED 50 MCG/0.5ML IM SUSR: 0.5000 mL | Freq: Once | INTRAMUSCULAR | 1 refills | Status: AC

## 2022-07-31 MED ORDER — HYDROCORTISONE 0.5 % EX CREA
1.0000 | TOPICAL_CREAM | Freq: Two times a day (BID) | CUTANEOUS | 1 refills | Status: DC
  Filled 2022-07-31: qty 30, 15d supply, fill #0

## 2022-07-31 NOTE — Progress Notes (Signed)
CPE Face itching

## 2022-07-31 NOTE — Patient Instructions (Signed)

## 2022-07-31 NOTE — Progress Notes (Signed)
Subjective:  Patient ID: Ashley Jacobs, female    DOB: [birth year]  Age: 66 y.o. MRN: [id number]  CC: Gynecologic Exam and Annual Exam   HPI Ashley Essexanka M Erkkila is a 66 y.o. year old female with a history of hypertension, GERD, chronic pain  She presents for a complete physical exam.  Interval History: Due for Pap smear, mammogram, colon cancer screening. Also due for Shingrix vaccine Exercises intermittently by means of walking.  She Complains of  itching on her face x2-3 days and feels she is breaking out. Denies change in skin products. She does not itch on her body. Itch is worse when she goes outside Past Medical History:  Diagnosis Date   Anginal pain ([facility]) 05/30/2012   Chronic pain    right hip   Hypertension    Leg DVT (deep venous thromboembolism), chronic ([facility]) 2009; 06/02/2012   "always left"   Shortness of breath 05/30/2012   Tuberculosis ~ 1980   "took medicine for it 6 months"    Past Surgical History:  Procedure Laterality Date   VENA CAVA FILTER PLACEMENT  06/02/12    No family history on file.  Social History   Socioeconomic History   Marital status: Married    Spouse name: Not on file   Number of children: Not on file   Years of education: Not on file   Highest education level: Not on file  Occupational History   Not on file  Tobacco Use   Smoking status: Never   Smokeless tobacco: Never  Substance and Sexual Activity   Alcohol use: No   Drug use: No   Sexual activity: Never  Other Topics Concern   Not on file  Social History Narrative   Not on file   Social Determinants of Health   Financial Resource Strain: Not on file  Food Insecurity: Not on file  Transportation Needs: Not on file  Physical Activity: Not on file  Stress: Not on file  Social Connections: Not on file    No Known Allergies  Outpatient Medications Prior to Visit  Medication Sig Dispense Refill   cetirizine (ZYRTEC) 10 MG tablet Take 1 tablet (10 mg total) by mouth  daily. 30 tablet 1   diclofenac Sodium (VOLTAREN) 1 % GEL Apply 4 g topically 4 (four) times daily. 100 g 1   gabapentin (NEURONTIN) 300 MG capsule TAKE 1 CAPSULE (300 MG TOTAL) BY MOUTH AT BEDTIME. 30 capsule 6   lidocaine (LIDODERM) 5 % Place 1 patch onto the skin daily. Remove & Discard patch within 12 hours or as directed by MD 30 patch 3   lisinopril-hydrochlorothiazide (ZESTORETIC) 20-25 MG tablet Take 1 tablet by mouth daily. 30 tablet 6   methocarbamol (ROBAXIN) 500 MG tablet Take 1 tablet (500 mg total) by mouth every 8 (eight) hours as needed for muscle spasms. 60 tablet 2   omeprazole (PRILOSEC) 20 MG capsule Take 1 capsule (20 mg total) by mouth 2 (two) times daily before a meal. 60 capsule 6   No facility-administered medications prior to visit.     ROS Review of Systems  Constitutional:  Negative for activity change, appetite change and fatigue.  HENT:  Negative for congestion, sinus pressure and sore throat.   Eyes:  Negative for visual disturbance.  Respiratory:  Negative for cough, chest tightness, shortness of breath and wheezing.   Cardiovascular:  Negative for chest pain and palpitations.  Gastrointestinal:  Negative for abdominal distention, abdominal pain and constipation.  Endocrine: Negative  for polydipsia.  Genitourinary:  Negative for dysuria and frequency.  Musculoskeletal:  Negative for arthralgias and back pain.  Skin:  Negative for rash.  Neurological:  Negative for tremors, light-headedness and numbness.  Hematological:  Does not bruise/bleed easily.  Psychiatric/Behavioral:  Negative for agitation and behavioral problems.     Objective:  BP 117/79   Pulse 97   Temp 98 F (36.7 C) (Oral)   Ht 5' (1.524 m)   Wt 145 lb 9.6 oz (66 kg)   SpO2 97%   BMI 28.44 kg/m      07/31/2022    9:40 AM 06/19/2022    8:36 AM 11/12/2021    3:50 PM  BP/Weight  Systolic BP 117 121 140  Diastolic BP 79 82 97  Wt. (Lbs) 145.6 141.8 153.6  BMI 28.44 kg/m2 23.6  kg/m2 25.56 kg/m2      Physical Exam Exam conducted with a chaperone present.  Constitutional:      General: She is not in acute distress.    Appearance: She is well-developed. She is not diaphoretic.  HENT:     Head: Normocephalic.     Right Ear: External ear normal.     Left Ear: External ear normal.     Nose: Nose normal.  Eyes:     Conjunctiva/sclera: Conjunctivae normal.     Pupils: Pupils are equal, round, and reactive to light.  Neck:     Vascular: No JVD.  Cardiovascular:     Rate and Rhythm: Normal rate and regular rhythm.     Heart sounds: Normal heart sounds. No murmur heard.    No gallop.  Pulmonary:     Effort: Pulmonary effort is normal. No respiratory distress.     Breath sounds: Normal breath sounds. No wheezing or rales.  Chest:     Chest wall: No tenderness.  Breasts:    Right: Normal. No mass, nipple discharge or tenderness.     Left: Normal. No mass, nipple discharge or tenderness.  Abdominal:     General: Bowel sounds are normal. There is no distension.     Palpations: Abdomen is soft. There is no mass.     Tenderness: There is no abdominal tenderness.     Hernia: There is no hernia in the left inguinal area or right inguinal area.  Genitourinary:    General: Normal vulva.     Pubic Area: No rash.      Labia:        Right: No rash.        Left: No rash.      Vagina: Normal.     Cervix: Normal.     Uterus: Normal.      Adnexa: Right adnexa normal and left adnexa normal.       Right: No tenderness.         Left: No tenderness.    Musculoskeletal:        General: No tenderness. Normal range of motion.     Cervical back: Normal range of motion. No tenderness.  Lymphadenopathy:     Upper Body:     Right upper body: No supraclavicular or axillary adenopathy.     Left upper body: No supraclavicular or axillary adenopathy.  Skin:    General: Skin is warm and dry.     Comments: Small hives sparsely distributed on forehead and cheeks with normal  pigmentation.  No Malar rash present.  Neurological:     Mental Status: She is alert and oriented to person,  place, and time.     Deep Tendon Reflexes: Reflexes are normal and symmetric.        Latest Ref Rng & Units 06/19/2022    9:13 AM 11/12/2021    4:26 PM 02/20/2021   11:33 AM  CMP  Glucose 70 - 99 mg/dL 89  852  778   BUN 8 - 27 mg/dL 8  16  10    Creatinine 0.57 - 1.00 mg/dL  2.42  3.53   Sodium 134 - 144 mmol/L 141  138  139   Potassium 3.5 - 5.2 mmol/L 4.0  3.7  4.0   Chloride 96 - 106 mmol/L 102  100  102   CO2 20 - 29 mmol/L 24  26  23    Calcium 8.7 - 10.3 mg/dL 9.6  9.1  9.1   Total Protein 6.0 - 8.5 g/dL 7.6  7.9  7.6   Total Bilirubin 0.0 - 1.2 mg/dL 0.6  0.3  0.6   Alkaline Phos 44 - 121 IU/L 100  91  103   AST 0 - 40 IU/L 25  27  26    ALT 0 - 32 IU/L 26  33  25     Lipid Panel     Component Value Date/Time   CHOL 143 11/12/2021 1626   TRIG 109 11/12/2021 1626   HDL 44 11/12/2021 1626   CHOLHDL 3.5 02/20/2021 1133   CHOLHDL 4.0 10/17/2016 0836   VLDL 24 11/16/2015 1051   LDLCALC 79 11/12/2021 1626    CBC    Component Value Date/Time   WBC 5.9 02/20/2021 1133   WBC 7.9 07/03/2014 1752   RBC 3.95 02/20/2021 1133   RBC 4.44 07/03/2014 1752   HGB 11.8 02/20/2021 1133   HCT 36.2 02/20/2021 1133   PLT 215 02/20/2021 1133   MCV 92 02/20/2021 1133   MCH 29.9 02/20/2021 1133   MCH 30.2 07/03/2014 1752   MCHC 32.6 02/20/2021 1133   MCHC 34.3 07/03/2014 1752   RDW 13.9 02/20/2021 1133   LYMPHSABS 1.9 02/20/2021 1133   MONOABS 0.5 06/08/2013 0139   EOSABS 0.1 02/20/2021 1133   BASOSABS 0.0 02/20/2021 1133    Lab Results  Component Value Date   HGBA1C 6.1 06/19/2022   The 10-year ASCVD risk score (Arnett DK, et al., 2019) is: 4.6%   Values used to calculate the score:     Age: 5 years     Sex: Female     Is Non-Hispanic African American: No     Diabetic: No     Tobacco smoker: No     Systolic Blood Pressure: 117 mmHg     Is BP treated:  Yes     HDL Cholesterol: 44 mg/dL     Total Cholesterol: 143 mg/dL   Assessment & Plan:  1. Annual visit for general adult medical examination with abnormal findings Counseled on 150 minutes of exercise per week, healthy eating (including decreased daily intake of saturated fats, cholesterol, added sugars, sodium), routine healthcare maintenance.   2. Encounter for screening mammogram for malignant neoplasm of breast - [place] DIGITAL SCREENING TOMO BILATERAL; Future  3. Screening for colon cancer - Fecal occult blood, imunochemical([facility])  4. Screening for cervical cancer - Cytology - PAP  5. Itching Symptoms have been present for 3 days.  She denies change in skin products History and examination do not point towards autoimmune etiology If symptoms persist will consider additional work-up. - hydrocortisone cream 0.5 %; Apply 1 Application topically 2 (  two) times daily.  Dispense: 30 g; Refill: 1   Health Care Maintenance: We will print her prescription for shingles vaccine so she can obtain this via the medication assistance program downstairs at the pharmacy. Meds ordered this encounter  Medications   hydrocortisone cream 0.5 %    Sig: Apply 1 Application topically 2 (two) times daily.    Dispense:  30 g    Refill:  1    Follow-up: Return in about 6 months (around 01/29/2023) for Chronic medical conditions.       Hoy Register, MD, FAAFP. [facility] [place], [place] [phone]   07/31/2022, 10:18 AM

## 2022-07-31 NOTE — Addendum Note (Signed)
Addended by: Ronette Deter on: 07/31/2022 10:47 AM   Modules accepted: Orders

## 2022-08-01 ENCOUNTER — Other Ambulatory Visit: Payer: Self-pay

## 2022-08-04 ENCOUNTER — Other Ambulatory Visit: Payer: Self-pay

## 2022-08-04 LAB — CYTOLOGY - PAP
Adequacy: ABSENT
Comment: NEGATIVE
Diagnosis: NEGATIVE
High risk HPV: NEGATIVE

## 2022-08-05 ENCOUNTER — Other Ambulatory Visit: Payer: Self-pay

## 2022-08-08 ENCOUNTER — Other Ambulatory Visit: Payer: Self-pay

## 2022-08-14 ENCOUNTER — Other Ambulatory Visit: Payer: Self-pay

## 2022-08-15 ENCOUNTER — Other Ambulatory Visit: Payer: Self-pay

## 2022-08-18 ENCOUNTER — Other Ambulatory Visit: Payer: Self-pay

## 2022-08-21 ENCOUNTER — Other Ambulatory Visit: Payer: Self-pay

## 2022-09-19 ENCOUNTER — Other Ambulatory Visit (HOSPITAL_COMMUNITY): Payer: Self-pay

## 2022-09-19 ENCOUNTER — Other Ambulatory Visit: Payer: Self-pay | Admitting: Family Medicine

## 2022-09-19 ENCOUNTER — Other Ambulatory Visit: Payer: Self-pay

## 2022-09-19 DIAGNOSIS — G894 Chronic pain syndrome: Secondary | ICD-10-CM

## 2022-09-19 MED ORDER — GABAPENTIN 300 MG PO CAPS
300.0000 mg | ORAL_CAPSULE | Freq: Every day | ORAL | 5 refills | Status: DC
  Filled 2022-09-19: qty 30, 30d supply, fill #0
  Filled 2022-10-22 (×2): qty 30, 30d supply, fill #1
  Filled 2022-12-25: qty 30, 30d supply, fill #2
  Filled 2023-01-21 (×3): qty 30, 30d supply, fill #3

## 2022-09-19 NOTE — Telephone Encounter (Signed)
Future visit in 4 months .  Requested Prescriptions  Pending Prescriptions Disp Refills   gabapentin (NEURONTIN) 300 MG capsule 30 capsule 5    Sig: TAKE 1 CAPSULE (300 MG TOTAL) BY MOUTH AT BEDTIME.     Neurology: Anticonvulsants - gabapentin Passed - 09/19/2022 10:03 AM      Passed - Cr in normal range and within 360 days    Creat  Date Value Ref Range Status  10/17/2016 0.76 0.50 - 1.05 mg/dL Final    Comment:      For patients > or = 65 years of age: The upper reference limit for Creatinine is approximately 13% higher for people identified as African-American.      Creatinine, Ser  Date Value Ref Range Status  06/19/2022 0.85 0.57 - 1.00 mg/dL Final         Passed - Completed PHQ-2 or PHQ-9 in the last 360 days      Passed - Valid encounter within last 12 months    Recent Outpatient Visits           1 month ago Annual visit for general adult medical examination with abnormal findings   [place], Enobong, MD   3 months ago Prediabetes   [place] Charlott Rakes, MD   10 months ago Essential hypertension   [place], Enobong, MD   1 year ago Encounter for screening mammogram for malignant neoplasm of breast   [place], Enobong, MD   1 year ago Acute pain of right knee   [place], PA-C       Future Appointments             In 4 months Charlott Rakes, MD [place]

## 2022-09-30 ENCOUNTER — Other Ambulatory Visit: Payer: Self-pay

## 2022-10-22 ENCOUNTER — Other Ambulatory Visit: Payer: Self-pay

## 2022-11-18 ENCOUNTER — Other Ambulatory Visit: Payer: Self-pay

## 2022-11-21 ENCOUNTER — Other Ambulatory Visit: Payer: Self-pay

## 2022-11-21 ENCOUNTER — Emergency Department (HOSPITAL_COMMUNITY)
Admission: EM | Admit: 2022-11-21 | Discharge: 2022-11-22 | Disposition: A | Discharge status: Home / Self Care | Payer: Medicaid Other | Attending: Emergency Medicine | Admitting: Emergency Medicine

## 2022-11-21 ENCOUNTER — Emergency Department (HOSPITAL_COMMUNITY): Payer: Medicaid Other

## 2022-11-21 DIAGNOSIS — R000 Tachycardia, unspecified: Secondary | ICD-10-CM | POA: Insufficient documentation

## 2022-11-21 DIAGNOSIS — K529 Noninfective gastroenteritis and colitis, unspecified: Secondary | ICD-10-CM | POA: Insufficient documentation

## 2022-11-21 DIAGNOSIS — E119 Type 2 diabetes mellitus without complications: Secondary | ICD-10-CM | POA: Insufficient documentation

## 2022-11-21 DIAGNOSIS — R109 Unspecified abdominal pain: Secondary | ICD-10-CM | POA: Diagnosis present

## 2022-11-21 DIAGNOSIS — Z79899 Other long term (current) drug therapy: Secondary | ICD-10-CM | POA: Insufficient documentation

## 2022-11-21 DIAGNOSIS — I10 Essential (primary) hypertension: Secondary | ICD-10-CM | POA: Diagnosis not present

## 2022-11-21 LAB — CBC WITH DIFFERENTIAL/PLATELET
Abs Immature Granulocytes: 0.05 10*3/uL (ref 0.00–0.07)
Basophils Absolute: 0 10*3/uL (ref 0.0–0.1)
Basophils Relative: 0 %
Eosinophils Absolute: 0.1 10*3/uL (ref 0.0–0.5)
Eosinophils Relative: 1 %
HCT: 39.5 % (ref 36.0–46.0)
Hemoglobin: 13.1 g/dL (ref 12.0–15.0)
Immature Granulocytes: 0 %
Lymphocytes Relative: 22 %
Lymphs Abs: 3.1 10*3/uL (ref 0.7–4.0)
MCH: 30.3 pg (ref 26.0–34.0)
MCHC: 33.2 g/dL (ref 30.0–36.0)
MCV: 91.2 fL (ref 80.0–100.0)
Monocytes Absolute: 1.2 10*3/uL — ABNORMAL HIGH (ref 0.1–1.0)
Monocytes Relative: 8 %
Neutro Abs: 9.7 10*3/uL — ABNORMAL HIGH (ref 1.7–7.7)
Neutrophils Relative %: 69 %
Platelets: 368 10*3/uL (ref 150–400)
RBC: 4.33 MIL/uL (ref 3.87–5.11)
RDW: 13.6 % (ref 11.5–15.5)
WBC: 14.1 10*3/uL — ABNORMAL HIGH (ref 4.0–10.5)
nRBC: 0 % (ref 0.0–0.2)

## 2022-11-21 LAB — I-STAT CHEM 8, ED
BUN: 11 mg/dL (ref 8–23)
Calcium, Ion: 1 mmol/L — ABNORMAL LOW (ref 1.15–1.40)
Chloride: 99 mmol/L (ref 98–111)
Creatinine, Ser: 0.8 mg/dL (ref 0.44–1.00)
Glucose, Bld: 149 mg/dL — ABNORMAL HIGH (ref 70–99)
HCT: 42 % (ref 36.0–46.0)
Hemoglobin: 14.3 g/dL (ref 12.0–15.0)
Potassium: 3.6 mmol/L (ref 3.5–5.1)
Sodium: 136 mmol/L (ref 135–145)
TCO2: 25 mmol/L (ref 22–32)

## 2022-11-21 LAB — COMPREHENSIVE METABOLIC PANEL
ALT: 25 U/L (ref 0–44)
AST: 28 U/L (ref 15–41)
Albumin: 3.2 g/dL — ABNORMAL LOW (ref 3.5–5.0)
Alkaline Phosphatase: 72 U/L (ref 38–126)
Anion gap: 9 (ref 5–15)
BUN: 10 mg/dL (ref 8–23)
CO2: 23 mmol/L (ref 22–32)
Calcium: 8.8 mg/dL — ABNORMAL LOW (ref 8.9–10.3)
Chloride: 101 mmol/L (ref 98–111)
Creatinine, Ser: 0.9 mg/dL (ref 0.44–1.00)
GFR, Estimated: >60 mL/min (ref >60)
Glucose, Bld: 153 mg/dL — ABNORMAL HIGH (ref 70–99)
Potassium: 3.7 mmol/L (ref 3.5–5.1)
Sodium: 133 mmol/L — ABNORMAL LOW (ref 135–145)
Total Bilirubin: 1 mg/dL (ref 0.3–1.2)
Total Protein: 7.5 g/dL (ref 6.5–8.1)

## 2022-11-21 LAB — LIPASE, BLOOD: Lipase: 34 U/L (ref 11–51)

## 2022-11-21 LAB — TROPONIN I (HIGH SENSITIVITY): Troponin I (High Sensitivity): 3 ng/L (ref <18)

## 2022-11-21 MED ORDER — ONDANSETRON 4 MG PO TBDP
4.0000 mg | ORAL_TABLET | Freq: Once | ORAL | Status: AC
  Administered 2022-11-21: 4 mg via ORAL
  Filled 2022-11-21: qty 1

## 2022-11-21 MED ORDER — IOHEXOL 350 MG/ML SOLN
100.0000 mL | Freq: Once | INTRAVENOUS | Status: AC | PRN
  Administered 2022-11-21: 100 mL via INTRAVENOUS

## 2022-11-21 MED ORDER — FENTANYL CITRATE PF 50 MCG/ML IJ SOSY
50.0000 ug | PREFILLED_SYRINGE | Freq: Once | INTRAMUSCULAR | Status: AC
  Administered 2022-11-21: 50 ug via INTRAVENOUS
  Filled 2022-11-21: qty 1

## 2022-11-21 MED ORDER — LACTATED RINGERS IV BOLUS
1000.0000 mL | Freq: Once | INTRAVENOUS | Status: AC
  Administered 2022-11-21: 1000 mL via INTRAVENOUS

## 2022-11-21 NOTE — ED Triage Notes (Signed)
Pt arrived to triage with family, complaining of lower abdominal pain and vomiting that started a day ago.  Pt thought that is was gastritis and took her meds for that without relief. Pt then started to have some diarrhea today.   Pt vomiting in triage and had a syncopal event in triage while sitting in [place], pt quickly came around and was able to move herself to a wheelchair from the chair to be moved to triage

## 2022-11-21 NOTE — ED Provider Notes (Signed)
[place] Provider Note   CSN: [id number] Arrival date & time: 11/21/22  1929     History  Chief Complaint  Patient presents with   Abdominal Pain    Ashley Jacobs is a 67 y.o. female.  This is a 67 year old female with history of pretension, diabetes, and GERD presenting to the ED for abdominal pain.  Patient states she had severe abdominal pain that started yesterday.  She had an episode of vomiting, took meds because she thought it was reflux.  Had 1 episode of diarrhea and continued nausea.  She states the pain in her abdomen is on the lower part but it radiates up toward her chest.  She is also complaining of some back pain which is in the lower part, she has no flank pain and denies any fevers or chills.  States she has never experienced this before, denies any vaginal bleeding or discharge.    Home Medications Prior to Admission medications   Medication Sig Start Date End Date Taking? Authorizing Provider  cetirizine (ZYRTEC) 10 MG tablet Take 1 tablet (10 mg total) by mouth daily. 11/12/21   Charlott Rakes, MD  diclofenac Sodium (VOLTAREN) 1 % GEL Apply 4 g topically 4 (four) times daily. 06/19/22   Charlott Rakes, MD  gabapentin (NEURONTIN) 300 MG capsule Take 1 capsule (300 mg total) by mouth at bedtime. 09/19/22   Charlott Rakes, MD  hydrocortisone cream 0.5 % Apply 1 Application topically 2 (two) times daily. 07/31/22   Charlott Rakes, MD  lidocaine (LIDODERM) 5 % Place 1 patch onto the skin daily. Remove & Discard patch within 12 hours or as directed by MD 06/19/22   Charlott Rakes, MD  lisinopril-hydrochlorothiazide (ZESTORETIC) 20-25 MG tablet Take 1 tablet by mouth daily. 06/19/22   Charlott Rakes, MD  methocarbamol (ROBAXIN) 500 MG tablet Take 1 tablet (500 mg total) by mouth every 8 (eight) hours as needed for muscle spasms. 06/19/22   Charlott Rakes, MD  omeprazole (PRILOSEC) 20 MG capsule Take 1 capsule (20 mg total) by  mouth 2 (two) times daily before a meal. 06/19/22   Charlott Rakes, MD      Allergies    Patient has no known allergies.    Review of Systems   Review of Systems  Constitutional:  Negative for chills and fever.  Respiratory:  Negative for shortness of breath.   Cardiovascular:  Positive for chest pain.  Gastrointestinal:  Positive for abdominal distention and abdominal pain.  Genitourinary:  Negative for dysuria and flank pain.  Musculoskeletal:  Positive for back pain.    Physical Exam Updated Vital Signs BP 121/75   Pulse (!) 107   Temp 97.8 F (36.6 C) (Oral)   Resp 16   Ht '5\' 1"'$  (1.549 m)   Wt 65 kg   SpO2 97%   BMI 27.08 kg/m  Physical Exam Vitals and nursing note reviewed.  Eyes:     Extraocular Movements: Extraocular movements intact.  Cardiovascular:     Rate and Rhythm: Regular rhythm. Tachycardia present.     Heart sounds: Normal heart sounds. No murmur heard.    No friction rub. No gallop.  Pulmonary:     Effort: Pulmonary effort is normal. No respiratory distress.     Breath sounds: No wheezing or rales.  Chest:     Chest wall: No tenderness.  Abdominal:     Tenderness: There is abdominal tenderness in the suprapubic area. There is guarding (mild voluntary).  There is no rebound. Negative signs include Murphy's sign and McBurney's sign.  Skin:    General: Skin is warm.     Capillary Refill: Capillary refill takes less than 2 seconds.  Neurological:     General: No focal deficit present.     Mental Status: She is alert and oriented to person, place, and time.     ED Results / Procedures / Treatments   Labs (all labs ordered are listed, but only abnormal results are displayed) Labs Reviewed  CBC WITH DIFFERENTIAL/PLATELET - Abnormal; Notable for the following components:      Result Value   WBC 14.1 (*)    Neutro Abs 9.7 (*)    Monocytes Absolute 1.2 (*)    All other components within normal limits  COMPREHENSIVE METABOLIC PANEL - Abnormal;  Notable for the following components:   Sodium 133 (*)    Glucose, Bld 153 (*)    Calcium 8.8 (*)    Albumin 3.2 (*)    All other components within normal limits  LIPASE, BLOOD  URINALYSIS, ROUTINE W REFLEX MICROSCOPIC  I-STAT CHEM 8, ED    EKG None  Radiology No results found.  Procedures Procedures    Medications Ordered in ED Medications  ondansetron (ZOFRAN-ODT) disintegrating tablet 4 mg (4 mg Oral Given 11/21/22 2002)    ED Course/ Medical Decision Making/ A&P                             Medical Decision Making Presents with abdominal pain, her comorbidities include hypertension and diabetes.  Exam she does have some mild voluntary guarding over the suprapubic area of her abdomen.  She does not have any rebound tenderness, no McBurney's point or Murphy sign.  Differential diagnosis includes UTI, small bowel obstruction, bladder outlet obstruction, aortic aneurysm, gastroenteritis, ACS.  Basic labs were ordered in triage including CBC, CMP, urinalysis and lipase.  After my evaluation with patient complaining of abdominal pain that radiates up to her chest and through to her back I am concerned for acute aortic pathology.  Will obtain a CTA chest abdomen pelvis for further evaluation.  Urinalysis pending for UTI.  Will obtain bladder scan to see if she is retained any urine. Troponin sent as patient is complaining of pain that radiates up to her chest as well as EKG.  I personally reviewed and interpreted patient's EKG, sinus rhythm with rate 96, PR, QRS, QTc normal, no acute ischemic changes.  I personally reviewed and interpreted patient's labs, no electrolyte abnormalities, mild leukocytosis of 14.1 with no left shift.  Initial troponin 3.  Patient has a nonischemic EKG, normal troponin, low suspicion for ACS.  At the time of signout the CT dissection study pending final read and urinalysis pending.  I did personally look over patient's CT chest abdomen pelvis did not see  any evidence of acute aortic pathology, she does have some right upper lobe changes consistent with her previous tuberculosis infection.  However she is not having cough or other symptoms to suggest an acute tuberculosis infection.  If CT does not show any acute abdominal findings and urinalysis is without acute infection, plan to p.o. challenge patient and sent home with diagnosis of gastroenteritis.  Care was handed off to [employer] PA-C at [place] and/or Complexity of Data Reviewed Labs: ordered. Decision-making details documented in ED Course. Radiology: ordered and independent interpretation performed. Decision-making details documented in ED Course. ECG/medicine tests:  ordered and independent interpretation performed. Decision-making details documented in ED Course.  Risk Prescription drug management.         Final Clinical Impression(s) / ED Diagnoses Final diagnoses:  None    Rx / DC Orders ED Discharge Orders     None         Jimmie Molly, MD 11/22/22 Lynnell Catalan    Isla Pence, MD 11/24/22 [phone]

## 2022-11-21 NOTE — ED Provider Triage Note (Signed)
Emergency Medicine Provider Triage Evaluation Note  Ashley Jacobs , a 67 y.o. female  was evaluated in triage.  Pt complains of lower abdominal pain, onset last night, with vomiting, though to be gastritis and took meds. Today with diarrhea, no blood in stools or emesis. No fevers, chills, changes in bladder habits.  Review of Systems  Positive:  Negative:   Physical Exam  There were no vitals taken for this visit. Gen:   Awake, no distress   Resp:  Normal effort  MSK:   Moves extremities without difficulty  Other:  Lower abdominal TTP  Medical Decision Making  Medically screening exam initiated at 8:00 PM.  Appropriate orders placed.  Ashley Jacobs was informed that the remainder of the evaluation will be completed by another provider, this initial triage assessment does not replace that evaluation, and the importance of remaining in the ED until their evaluation is complete.     Tacy Learn, PA-C 11/21/22 2009

## 2022-11-21 NOTE — ED Notes (Signed)
Pt transported to CT ?

## 2022-11-22 LAB — URINALYSIS, ROUTINE W REFLEX MICROSCOPIC
Bilirubin Urine: NEGATIVE
Glucose, UA: NEGATIVE mg/dL
Hgb urine dipstick: NEGATIVE
Ketones, ur: NEGATIVE mg/dL
Leukocytes,Ua: NEGATIVE
Nitrite: NEGATIVE
Protein, ur: NEGATIVE mg/dL
Specific Gravity, Urine: 1.023 (ref 1.005–1.030)
pH: 7 (ref 5.0–8.0)

## 2022-11-22 MED ORDER — ONDANSETRON 4 MG PO TBDP: 4.0000 mg | ORAL_TABLET | Freq: Three times a day (TID) | ORAL | 0 refills | Status: DC | PRN

## 2022-11-22 NOTE — ED Provider Notes (Signed)
2:12 AM Patient given ice chips for PO trial. Denies nausea at present.  I have personally viewed and interpreted the patient's CT imaging which does not show any evidence of aortic dissection or aneurysm.  Findings consistent with enteritis.  Anticipate discharge with outpatient course of antiemetics for symptom control.  Patient and family agreeable with plan.  Results for orders placed or performed during the hospital encounter of 11/21/22  CBC with Differential  Result Value Ref Range   WBC 14.1 (H) 4.0 - 10.5 K/uL   RBC 4.33 3.87 - 5.11 MIL/uL   Hemoglobin 13.1 12.0 - 15.0 g/dL   HCT 39.5 36.0 - 46.0 %   MCV 91.2 80.0 - 100.0 fL   MCH 30.3 26.0 - 34.0 pg   MCHC 33.2 30.0 - 36.0 g/dL   RDW 13.6 11.5 - 15.5 %   Platelets 368 150 - 400 K/uL   nRBC 0.0 0.0 - 0.2 %   Neutrophils Relative % 69 %   Neutro Abs 9.7 (H) 1.7 - 7.7 K/uL   Lymphocytes Relative 22 %   Lymphs Abs 3.1 0.7 - 4.0 K/uL   Monocytes Relative 8 %   Monocytes Absolute 1.2 (H) 0.1 - 1.0 K/uL   Eosinophils Relative 1 %   Eosinophils Absolute 0.1 0.0 - 0.5 K/uL   Basophils Relative 0 %   Basophils Absolute 0.0 0.0 - 0.1 K/uL   Immature Granulocytes 0 %   Abs Immature Granulocytes 0.05 0.00 - 0.07 K/uL  Comprehensive metabolic panel  Result Value Ref Range   Sodium 133 (L) 135 - 145 mmol/L   Potassium 3.7 3.5 - 5.1 mmol/L   Chloride 101 98 - 111 mmol/L   CO2 23 22 - 32 mmol/L   Glucose, Bld 153 (H) 70 - 99 mg/dL   BUN 10 8 - 23 mg/dL   Creatinine, Ser 0.90 0.44 - 1.00 mg/dL   Calcium 8.8 (L) 8.9 - 10.3 mg/dL   Total Protein 7.5 6.5 - 8.1 g/dL   Albumin 3.2 (L) 3.5 - 5.0 g/dL   AST 28 15 - 41 U/L   ALT 25 0 - 44 U/L   Alkaline Phosphatase 72 38 - 126 U/L   Total Bilirubin 1.0 0.3 - 1.2 mg/dL   GFR, Estimated >60 >60 mL/min   Anion gap 9 5 - 15  Lipase, blood  Result Value Ref Range   Lipase 34 11 - 51 U/L  Urinalysis, Routine w reflex microscopic -Urine, Clean Catch  Result Value Ref Range   Color, Urine  STRAW (A) YELLOW   APPearance CLEAR CLEAR   Specific Gravity, Urine 1.023 1.005 - 1.030   pH 7.0 5.0 - 8.0   Glucose, UA NEGATIVE NEGATIVE mg/dL   Hgb urine dipstick NEGATIVE NEGATIVE   Bilirubin Urine NEGATIVE NEGATIVE   Ketones, ur NEGATIVE NEGATIVE mg/dL   Protein, ur NEGATIVE NEGATIVE mg/dL   Nitrite NEGATIVE NEGATIVE   Leukocytes,Ua NEGATIVE NEGATIVE  I-Stat Chem 8, ED  Result Value Ref Range   Sodium 136 135 - 145 mmol/L   Potassium 3.6 3.5 - 5.1 mmol/L   Chloride 99 98 - 111 mmol/L   BUN 11 8 - 23 mg/dL   Creatinine, Ser 0.80 0.44 - 1.00 mg/dL   Glucose, Bld 149 (H) 70 - 99 mg/dL   Calcium, Ion 1.00 (L) 1.15 - 1.40 mmol/L   TCO2 25 22 - 32 mmol/L   Hemoglobin 14.3 12.0 - 15.0 g/dL   HCT 42.0 36.0 - 46.0 %  Troponin  I (High Sensitivity)  Result Value Ref Range   Troponin I (High Sensitivity) 3 <18 ng/L   CT Angio Chest/Abd/Pel for Dissection W and/or Wo Contrast  Result Date: 11/22/2022 CLINICAL DATA:  Acute aortic syndrome suspected EXAM: CT ANGIOGRAPHY CHEST, ABDOMEN AND PELVIS TECHNIQUE: Non-contrast CT of the chest was initially obtained. Multidetector CT imaging through the chest, abdomen and pelvis was performed using the standard protocol during bolus administration of intravenous contrast. Multiplanar reconstructed images and MIPs were obtained and reviewed to evaluate the vascular anatomy. RADIATION DOSE REDUCTION: This exam was performed according to the departmental dose-optimization program which includes automated exposure control, adjustment of the mA and/or kV according to patient size and/or use of iterative reconstruction technique. CONTRAST:  15m OMNIPAQUE IOHEXOL 350 MG/ML SOLN COMPARISON:  None Available. FINDINGS: CTA CHEST FINDINGS Cardiovascular: Preferential opacification of the thoracic aorta. No evidence of thoracic aortic aneurysm or dissection. Normal heart size. No pericardial effusion. Aortic calcification. Mediastinum/Nodes: No mediastinal or hilar  adenopathy. Unremarkable esophagus. Lungs/Pleura: Redemonstrated sequela of prior tuberculosis with biapical scarring, worse on the right where there is some central cavitation and/or bronchiectasis. Right apical pleural thickening. Unchanged scarring/atelectasis in the right middle lobe. No new focal consolidation. No pleural effusion or pneumothorax. Musculoskeletal: No chest wall abnormality. No acute or significant osseous findings. Review of the MIP images confirms the above findings. CTA ABDOMEN AND PELVIS FINDINGS VASCULAR Aorta: Normal caliber aorta without aneurysm, dissection, vasculitis or significant stenosis. Aortic atherosclerotic calcification. Celiac: Patent without evidence of aneurysm, dissection, vasculitis or significant stenosis. SMA: Patent without evidence of aneurysm, dissection, vasculitis or significant stenosis. Renals: Both renal arteries are patent without evidence of aneurysm, dissection, vasculitis, fibromuscular dysplasia or significant stenosis. IMA: Patent without evidence of aneurysm, dissection, vasculitis or significant stenosis. Inflow: Patent without evidence of aneurysm, dissection, vasculitis or significant stenosis. Veins: No obvious venous abnormality within the limitations of this arterial phase study. IVC filter. Review of the MIP images confirms the above findings. NON-VASCULAR Hepatobiliary: Hepatic steatosis. Decompressed gallbladder. No biliary dilation. Pancreas: Unremarkable. Spleen: Unremarkable. Adrenals/Urinary Tract: Normal adrenal glands. No urinary calculi or hydronephrosis. Unremarkable bladder. Stomach/Bowel: Borderline dilation of the small bowel in the pelvis with bowel wall thickening, mucosal hyperenhancement and adjacent free fluid. No abrupt transition point to suggest mechanical obstruction. Normal caliber colon without bowel wall thickening. Normal appendix. Lymphatic: Multiple calcified mesenteric and retroperitoneal lymph nodes. Given history of  tuberculosis these may be related to prior tuberculosis infection. Reproductive: Unremarkable. Other: No free intraperitoneal air. Musculoskeletal: No acute osseous abnormality. Review of the MIP images confirms the above findings. IMPRESSION: 1. Findings compatible with nonspecific enteritis, favored infectious or inflammatory with ischemic considered less likely though not excluded. 2. No evidence of aortic dissection or aneurysm. 3. Sequela of prior tuberculosis with calcified retroperitoneal and mesenteric lymph nodes and biapical pulmonary biapical scarring 4. Hepatic steatosis. Electronically Signed   By: TPlacido SouM.D.   On: 11/22/2022 00:05      HAntonietta Breach PA-C 11/22/22 [id number]   MMerrily Pew MD 11/22/22 [phone]

## 2022-11-22 NOTE — Discharge Instructions (Addendum)
Avoid fried foods, fatty foods, greasy foods, and milk products until symptoms resolve. Drinks plenty of clear liquids to prevent dehydration. We recommend the use of Zofran as prescribed for nausea/vomiting. Follow-up with your primary care doctor to ensure resolution of symptoms.

## 2022-12-01 ENCOUNTER — Other Ambulatory Visit: Payer: Self-pay

## 2022-12-25 ENCOUNTER — Other Ambulatory Visit (HOSPITAL_COMMUNITY): Payer: Self-pay

## 2022-12-25 ENCOUNTER — Other Ambulatory Visit: Payer: Self-pay

## 2023-01-21 ENCOUNTER — Other Ambulatory Visit: Payer: Self-pay

## 2023-01-21 ENCOUNTER — Other Ambulatory Visit (HOSPITAL_COMMUNITY): Payer: Self-pay

## 2023-01-21 ENCOUNTER — Other Ambulatory Visit: Payer: Self-pay | Admitting: Family Medicine

## 2023-01-21 DIAGNOSIS — I10 Essential (primary) hypertension: Secondary | ICD-10-CM

## 2023-01-21 DIAGNOSIS — K219 Gastro-esophageal reflux disease without esophagitis: Secondary | ICD-10-CM

## 2023-01-21 MED ORDER — OMEPRAZOLE 20 MG PO CPDR
20.0000 mg | DELAYED_RELEASE_CAPSULE | Freq: Two times a day (BID) | ORAL | 0 refills | Status: DC
  Filled 2023-01-21: qty 180, 90d supply, fill #0

## 2023-01-21 MED ORDER — LISINOPRIL-HYDROCHLOROTHIAZIDE 20-25 MG PO TABS
1.0000 | ORAL_TABLET | Freq: Every day | ORAL | 0 refills | Status: DC
  Filled 2023-01-21: qty 90, 90d supply, fill #0

## 2023-01-21 NOTE — Telephone Encounter (Signed)
Requested Prescriptions  Pending Prescriptions Disp Refills   lisinopril-hydrochlorothiazide (ZESTORETIC) 20-25 MG tablet 90 tablet 0    Sig: Take 1 tablet by mouth daily.     Cardiovascular:  ACEI + Diuretic Combos Passed - 01/21/2023 10:07 AM      Passed - Na in normal range and within 180 days    Sodium  Date Value Ref Range Status  11/21/2022 136 135 - 145 mmol/L Final  06/19/2022 141 134 - 144 mmol/L Final         Passed - K in normal range and within 180 days    Potassium  Date Value Ref Range Status  11/21/2022 3.6 3.5 - 5.1 mmol/L Final         Passed - Cr in normal range and within 180 days    Creat  Date Value Ref Range Status  10/17/2016 0.76 0.50 - 1.05 mg/dL Final    Comment:      For patients > or = 65 years of age: The upper reference limit for Creatinine is approximately 13% higher for people identified as African-American.      Creatinine, Ser  Date Value Ref Range Status  11/21/2022 0.80 0.44 - 1.00 mg/dL Final         Passed - eGFR is 30 or above and within 180 days    GFR, Est African American  Date Value Ref Range Status  10/17/2016 >89 >=60 mL/min Final   GFR calc Af Amer  Date Value Ref Range Status  10/15/2020 97 >59 mL/min/1.73 Final    Comment:    **In accordance with recommendations from the NKF-ASN Task force,**   [facility] is in the process of updating its eGFR calculation to the   2021 CKD-EPI creatinine equation that estimates kidney function   without a race variable.    GFR, Est Non African American  Date Value Ref Range Status  10/17/2016 87 >=60 mL/min Final   GFR, Estimated  Date Value Ref Range Status  11/21/2022 >60 >60 mL/min Final    Comment:    (NOTE) Calculated using the CKD-EPI Creatinine Equation (2021)    eGFR  Date Value Ref Range Status  06/19/2022 77 >59 mL/min/1.73 Final         Passed - Patient is not pregnant      Passed - Last BP in normal range    BP Readings from Last 1 Encounters:  11/22/22  114/82         Passed - Valid encounter within last 6 months    Recent Outpatient Visits           5 months ago Annual visit for general adult medical examination with abnormal findings   [facility] [place], Odette Horns, MD   7 months ago Prediabetes   [facility] Hoy Register, MD   1 year ago Essential hypertension   [facility] Hoy Register, MD   1 year ago Encounter for screening mammogram for malignant neoplasm of breast    [facility] Hoy Register, MD   1 year ago Acute pain of right knee   [facility] [place], [place], PA-C       Future Appointments             In 1 week Hoy Register, MD [facility]  omeprazole (PRILOSEC) 20 MG capsule 180 capsule 0    Sig: Take 1 capsule (20 mg total) by mouth 2 (two) times daily before a meal.     Gastroenterology: Proton Pump Inhibitors Passed - 01/21/2023 10:07 AM      Passed - Valid encounter within last 12 months    Recent Outpatient Visits           5 months ago Annual visit for general adult medical examination with abnormal findings   [facility] Hoy Register, MD   7 months ago Prediabetes   [facility] Hoy Register, MD   1 year ago Essential hypertension   [facility] Hoy Register, MD   1 year ago Encounter for screening mammogram for malignant neoplasm of breast   [facility] Hoy Register, MD   1 year ago Acute pain of right knee   [facility] [place], Kasandra Knudsen, PA-C       Future Appointments             In 1 week Hoy Register, MD [facility]

## 2023-01-29 ENCOUNTER — Other Ambulatory Visit: Payer: Self-pay

## 2023-01-29 ENCOUNTER — Ambulatory Visit: Payer: Medicaid Other | Attending: Family Medicine | Admitting: Family Medicine

## 2023-01-29 DIAGNOSIS — K219 Gastro-esophageal reflux disease without esophagitis: Secondary | ICD-10-CM

## 2023-01-29 DIAGNOSIS — M62838 Other muscle spasm: Secondary | ICD-10-CM | POA: Diagnosis not present

## 2023-01-29 DIAGNOSIS — M5441 Lumbago with sciatica, right side: Secondary | ICD-10-CM

## 2023-01-29 DIAGNOSIS — M5442 Lumbago with sciatica, left side: Secondary | ICD-10-CM | POA: Diagnosis not present

## 2023-01-29 DIAGNOSIS — G894 Chronic pain syndrome: Secondary | ICD-10-CM

## 2023-01-29 DIAGNOSIS — G8929 Other chronic pain: Secondary | ICD-10-CM | POA: Diagnosis not present

## 2023-01-29 DIAGNOSIS — I10 Essential (primary) hypertension: Secondary | ICD-10-CM

## 2023-01-29 MED ORDER — GABAPENTIN 300 MG PO CAPS
300.0000 mg | ORAL_CAPSULE | Freq: Every day | ORAL | 1 refills | Status: DC
  Filled 2023-01-29 – 2023-03-26 (×3): qty 90, 90d supply, fill #0
  Filled 2023-06-22: qty 90, 90d supply, fill #1

## 2023-01-29 MED ORDER — LIDOCAINE 5 % EX PTCH
1.0000 | MEDICATED_PATCH | CUTANEOUS | 3 refills | Status: DC
  Filled 2023-01-29: qty 30, 30d supply, fill #0

## 2023-01-29 MED ORDER — DULOXETINE HCL 60 MG PO CPEP
60.0000 mg | ORAL_CAPSULE | Freq: Every day | ORAL | 1 refills | Status: DC
  Filled 2023-01-29: qty 90, 90d supply, fill #0

## 2023-01-29 MED ORDER — METHOCARBAMOL 500 MG PO TABS
500.0000 mg | ORAL_TABLET | Freq: Three times a day (TID) | ORAL | 1 refills | Status: AC | PRN
  Filled 2023-01-29: qty 180, 60d supply, fill #0

## 2023-01-29 MED ORDER — DICLOFENAC SODIUM 1 % EX GEL
4.0000 g | Freq: Four times a day (QID) | CUTANEOUS | 1 refills | Status: DC
  Filled 2023-01-29: qty 100, 7d supply, fill #0

## 2023-01-29 MED ORDER — LISINOPRIL-HYDROCHLOROTHIAZIDE 20-25 MG PO TABS
1.0000 | ORAL_TABLET | Freq: Every day | ORAL | 1 refills | Status: DC
  Filled 2023-01-29 – 2023-03-26 (×3): qty 90, 90d supply, fill #0
  Filled 2023-06-22: qty 90, 90d supply, fill #1

## 2023-01-29 MED ORDER — OMEPRAZOLE 20 MG PO CPDR
20.0000 mg | DELAYED_RELEASE_CAPSULE | Freq: Two times a day (BID) | ORAL | 1 refills | Status: DC
  Filled 2023-01-29 – 2023-03-26 (×3): qty 180, 90d supply, fill #0
  Filled 2023-06-22: qty 180, 90d supply, fill #1

## 2023-01-29 NOTE — Progress Notes (Signed)
Subjective:  Patient ID: Ashley Jacobs, female    DOB: 07-13-1956  Age: 67 y.o. MRN: [id number]  CC: Hypertension   HPI Ashley Jacobs is a 67 y.o. year old female with a history of hypertension, GERD, chronic pain.  Interval History:  She has been having moderate to severe low back pain which is absent today but has been present for over a year, worse with prolonged standing. With medications she has some improvement.  Pain does not radiate down her lower extremities.  She is currently on gabapentin and Robaxin for this.  She is adherent with her antihypertensive.  Her reflux symptoms have been controlled on her PPI. Past Medical History:  Diagnosis Date   Anginal pain (HCC) 05/30/2012   Chronic pain    right hip   Hypertension    Leg DVT (deep venous thromboembolism), chronic (HCC) 2009; 06/02/2012   "always left"   Shortness of breath 05/30/2012   Tuberculosis ~ 1980   "took medicine for it 6 months"    Past Surgical History:  Procedure Laterality Date   VENA CAVA FILTER PLACEMENT  06/02/12    No family history on file.  Social History   Socioeconomic History   Marital status: Married    Spouse name: Not on file   Number of children: Not on file   Years of education: Not on file   Highest education level: Not on file  Occupational History   Not on file  Tobacco Use   Smoking status: Never   Smokeless tobacco: Never  Substance and Sexual Activity   Alcohol use: No   Drug use: No   Sexual activity: Never  Other Topics Concern   Not on file  Social History Narrative   Not on file   Social Determinants of Health   Financial Resource Strain: Not on file  Food Insecurity: Not on file  Transportation Needs: Not on file  Physical Activity: Not on file  Stress: Not on file  Social Connections: Not on file    No Known Allergies  Outpatient Medications Prior to Visit  Medication Sig Dispense Refill   cetirizine (ZYRTEC) 10 MG tablet Take 1 tablet (10 mg  total) by mouth daily. 30 tablet 1   hydrocortisone cream 0.5 % Apply 1 Application topically 2 (two) times daily. 30 g 1   ondansetron (ZOFRAN-ODT) 4 MG disintegrating tablet Take 1 tablet (4 mg total) by mouth every 8 (eight) hours as needed for nausea or vomiting. 10 tablet 0   diclofenac Sodium (VOLTAREN) 1 % GEL Apply 4 g topically 4 (four) times daily. 100 g 1   gabapentin (NEURONTIN) 300 MG capsule Take 1 capsule (300 mg total) by mouth at bedtime. 30 capsule 5   lidocaine (LIDODERM) 5 % Place 1 patch onto the skin daily. Remove & Discard patch within 12 hours or as directed by MD 30 patch 3   lisinopril-hydrochlorothiazide (ZESTORETIC) 20-25 MG tablet Take 1 tablet by mouth daily. 90 tablet 0   methocarbamol (ROBAXIN) 500 MG tablet Take 1 tablet (500 mg total) by mouth every 8 (eight) hours as needed for muscle spasms. 60 tablet 2   omeprazole (PRILOSEC) 20 MG capsule Take 1 capsule (20 mg total) by mouth 2 (two) times daily before a meal. 180 capsule 0   No facility-administered medications prior to visit.     ROS Review of Systems  Constitutional:  Negative for activity change and appetite change.  HENT:  Negative for sinus pressure and sore  throat.   Respiratory:  Negative for chest tightness, shortness of breath and wheezing.   Cardiovascular:  Negative for chest pain and palpitations.  Gastrointestinal:  Negative for abdominal distention, abdominal pain and constipation.  Genitourinary: Negative.   Musculoskeletal:  Positive for back pain.  Psychiatric/Behavioral:  Negative for behavioral problems and dysphoric mood.     Objective:  BP 127/84   Pulse 99   Ht 5\' 1"  (1.549 m)   Wt 146 lb 6.4 oz (66.4 kg)   SpO2 98%   BMI 27.66 kg/m      01/29/2023   11:09 AM 11/22/2022    1:00 AM 11/21/2022   11:12 PM  BP/Weight  Systolic BP 127 114 116  Diastolic BP 84 82 81  Wt. (Lbs) 146.4    BMI 27.66 kg/m2        Physical Exam Constitutional:      Appearance: She is  well-developed.  Cardiovascular:     Rate and Rhythm: Normal rate.     Heart sounds: Normal heart sounds. No murmur heard. Pulmonary:     Effort: Pulmonary effort is normal.     Breath sounds: Normal breath sounds. No wheezing or rales.  Chest:     Chest wall: No tenderness.  Abdominal:     General: Bowel sounds are normal. There is no distension.     Palpations: Abdomen is soft. There is no mass.     Tenderness: There is no abdominal tenderness.  Musculoskeletal:        General: No tenderness. Normal range of motion.     Right lower leg: No edema.     Left lower leg: No edema.     Comments: Negative straight leg raise bilaterally No tenderness on palpation of lumbar spine  Neurological:     Mental Status: She is alert and oriented to person, place, and time.  Psychiatric:        Mood and Affect: Mood normal.        Latest Ref Rng & Units 11/21/2022    9:24 PM 11/21/2022    8:12 PM 06/19/2022    9:13 AM  CMP  Glucose 70 - 99 mg/dL 829  562  89   BUN 8 - 23 mg/dL 11  10  8    Creatinine 0.44 - 1.00 mg/dL 1.30  8.65  7.84   Sodium 135 - 145 mmol/L 136  133  141   Potassium 3.5 - 5.1 mmol/L 3.6  3.7  4.0   Chloride 98 - 111 mmol/L 99  101  102   CO2 22 - 32 mmol/L  23  24   Calcium 8.9 - 10.3 mg/dL  8.8  9.6   Total Protein 6.5 - 8.1 g/dL  7.5  7.6   Total Bilirubin 0.3 - 1.2 mg/dL  1.0  0.6   Alkaline Phos 38 - 126 U/L  72  100   AST 15 - 41 U/L  28  25   ALT 0 - 44 U/L  25  26     Lipid Panel     Component Value Date/Time   CHOL 143 11/12/2021 1626   TRIG 109 11/12/2021 1626   HDL 44 11/12/2021 1626   CHOLHDL 3.5 02/20/2021 1133   CHOLHDL 4.0 10/17/2016 0836   VLDL 24 11/16/2015 1051   LDLCALC 79 11/12/2021 1626    CBC    Component Value Date/Time   WBC 14.1 (H) 11/21/2022 2012   RBC 4.33 11/21/2022 2012   HGB 14.3 11/21/2022  2124   HGB 11.8 02/20/2021 1133   HCT 42.0 11/21/2022 2124   HCT 36.2 02/20/2021 1133   PLT 368 11/21/2022 2012   PLT 215  02/20/2021 1133   MCV 91.2 11/21/2022 2012   MCV 92 02/20/2021 1133   MCH 30.3 11/21/2022 2012   MCHC 33.2 11/21/2022 2012   RDW 13.6 11/21/2022 2012   RDW 13.9 02/20/2021 1133   LYMPHSABS 3.1 11/21/2022 2012   LYMPHSABS 1.9 02/20/2021 1133   MONOABS 1.2 (H) 11/21/2022 2012   EOSABS 0.1 11/21/2022 2012   EOSABS 0.1 02/20/2021 1133   BASOSABS 0.0 11/21/2022 2012   BASOSABS 0.0 02/20/2021 1133    Lab Results  Component Value Date   HGBA1C 6.1 06/19/2022   The 10-year ASCVD risk score (Arnett DK, et al., 2019) is: 6.1%   Values used to calculate the score:     Age: 61 years     Sex: Female     Is Non-Hispanic African American: No     Diabetic: No     Tobacco smoker: No     Systolic Blood Pressure: 127 mmHg     Is BP treated: Yes     HDL Cholesterol: 44 mg/dL     Total Cholesterol: 143 mg/dL   Assessment & Plan:  1. Chronic midline low back pain with bilateral sciatica Could be possibly arthritis Cymbalta added to regimen Advised to apply heat or ice whichever is tolerated to painful areas. Counseled on evidence of improvement in pain control with regards to yoga, water aerobics, massage, home physical therapy, exercise as tolerated. - DULoxetine (CYMBALTA) 60 MG capsule; Take 1 capsule (60 mg total) by mouth daily for back pain  Dispense: 90 capsule; Refill: 1 - DG Lumbar Spine Complete; Future - Ambulatory referral to Physical Therapy - diclofenac Sodium (VOLTAREN) 1 % GEL; Apply 4 g topically 4 (four) times daily.  Dispense: 100 g; Refill: 1 - lidocaine (LIDODERM) 5 %; Place 1 patch onto the skin daily. Remove & Discard patch within 12 hours or as directed by MD  Dispense: 30 patch; Refill: 3 - DG Sacrum/Coccyx; Future  2. Chronic pain syndrome - gabapentin (NEURONTIN) 300 MG capsule; Take 1 capsule (300 mg total) by mouth at bedtime.  Dispense: 90 capsule; Refill: 1  3. Essential hypertension Controlled Counseled on blood pressure goal of less than 130/80,  low-sodium, DASH diet, medication compliance, 150 minutes of moderate intensity exercise per week. Discussed medication compliance, adverse effects. - lisinopril-hydrochlorothiazide (ZESTORETIC) 20-25 MG tablet; Take 1 tablet by mouth daily.  Dispense: 90 tablet; Refill: 1  4. Muscle spasm Stable - methocarbamol (ROBAXIN) 500 MG tablet; Take 1 tablet (500 mg total) by mouth every 8 (eight) hours as needed for muscle spasms.  Dispense: 180 tablet; Refill: 1  5. Gastroesophageal reflux disease without esophagitis Controlled - omeprazole (PRILOSEC) 20 MG capsule; Take 1 capsule (20 mg total) by mouth 2 (two) times daily before a meal.  Dispense: 180 capsule; Refill: 1    Meds ordered this encounter  Medications   DULoxetine (CYMBALTA) 60 MG capsule    Sig: Take 1 capsule (60 mg total) by mouth daily for back pain    Dispense:  90 capsule    Refill:  1   diclofenac Sodium (VOLTAREN) 1 % GEL    Sig: Apply 4 g topically 4 (four) times daily.    Dispense:  100 g    Refill:  1   gabapentin (NEURONTIN) 300 MG capsule    Sig: Take 1  capsule (300 mg total) by mouth at bedtime.    Dispense:  90 capsule    Refill:  1   lidocaine (LIDODERM) 5 %    Sig: Place 1 patch onto the skin daily. Remove & Discard patch within 12 hours or as directed by MD    Dispense:  30 patch    Refill:  3   lisinopril-hydrochlorothiazide (ZESTORETIC) 20-25 MG tablet    Sig: Take 1 tablet by mouth daily.    Dispense:  90 tablet    Refill:  1   methocarbamol (ROBAXIN) 500 MG tablet    Sig: Take 1 tablet (500 mg total) by mouth every 8 (eight) hours as needed for muscle spasms.    Dispense:  180 tablet    Refill:  1   omeprazole (PRILOSEC) 20 MG capsule    Sig: Take 1 capsule (20 mg total) by mouth 2 (two) times daily before a meal.    Dispense:  180 capsule    Refill:  1    Follow-up: Return in about 6 months (around 08/01/2023) for Chronic medical conditions.       Hoy Register, MD, FAAFP. [facility] [place], [place] [phone]   01/29/2023, 11:39 AM

## 2023-01-29 NOTE — Progress Notes (Signed)
Back pain

## 2023-01-29 NOTE — Patient Instructions (Signed)

## 2023-02-27 ENCOUNTER — Ambulatory Visit: Payer: Medicaid Other | Attending: Family Medicine

## 2023-03-26 ENCOUNTER — Other Ambulatory Visit (HOSPITAL_COMMUNITY): Payer: Self-pay

## 2023-03-26 ENCOUNTER — Other Ambulatory Visit: Payer: Self-pay

## 2023-06-22 ENCOUNTER — Other Ambulatory Visit: Payer: Self-pay

## 2023-09-28 ENCOUNTER — Other Ambulatory Visit (HOSPITAL_COMMUNITY): Payer: Self-pay

## 2023-09-28 ENCOUNTER — Other Ambulatory Visit: Payer: Self-pay | Admitting: Family Medicine

## 2023-09-28 DIAGNOSIS — I10 Essential (primary) hypertension: Secondary | ICD-10-CM

## 2023-09-28 DIAGNOSIS — G894 Chronic pain syndrome: Secondary | ICD-10-CM

## 2023-09-28 DIAGNOSIS — K219 Gastro-esophageal reflux disease without esophagitis: Secondary | ICD-10-CM

## 2023-09-28 MED ORDER — OMEPRAZOLE 20 MG PO CPDR
20.0000 mg | DELAYED_RELEASE_CAPSULE | Freq: Two times a day (BID) | ORAL | 0 refills | Status: DC
  Filled 2023-09-28: qty 60, 30d supply, fill #0

## 2023-09-28 MED ORDER — GABAPENTIN 300 MG PO CAPS
300.0000 mg | ORAL_CAPSULE | Freq: Every day | ORAL | 1 refills | Status: DC
  Filled 2023-09-28 – 2023-12-25 (×3): qty 90, 90d supply, fill #0

## 2023-09-28 MED ORDER — LISINOPRIL-HYDROCHLOROTHIAZIDE 20-25 MG PO TABS
1.0000 | ORAL_TABLET | Freq: Every day | ORAL | 0 refills | Status: DC
  Filled 2023-09-28: qty 30, 30d supply, fill #0

## 2023-09-29 ENCOUNTER — Other Ambulatory Visit (HOSPITAL_COMMUNITY): Payer: Self-pay

## 2023-09-30 ENCOUNTER — Other Ambulatory Visit: Payer: Self-pay

## 2023-10-27 ENCOUNTER — Other Ambulatory Visit: Payer: Self-pay | Admitting: Family Medicine

## 2023-10-27 ENCOUNTER — Other Ambulatory Visit (HOSPITAL_COMMUNITY): Payer: Self-pay

## 2023-10-27 ENCOUNTER — Other Ambulatory Visit: Payer: Self-pay

## 2023-10-27 DIAGNOSIS — I10 Essential (primary) hypertension: Secondary | ICD-10-CM

## 2023-10-27 DIAGNOSIS — K219 Gastro-esophageal reflux disease without esophagitis: Secondary | ICD-10-CM

## 2023-10-27 NOTE — Telephone Encounter (Signed)
Requested medication (s) are due for refill today: Yes  Requested medication (s) are on the active medication list: Yes  Last refill:  09/28/23  Future visit scheduled: No  Notes to clinic:  Unable to refill per protocol, courtesy refill already given, routing for provider approval. Unable to refill per protocol, appointment needed.      Requested Prescriptions  Pending Prescriptions Disp Refills   lisinopril-hydrochlorothiazide (ZESTORETIC) 20-25 MG tablet 30 tablet 0    Sig: Take 1 tablet by mouth daily.     Cardiovascular:  ACEI + Diuretic Combos Failed - 10/27/2023  3:21 PM      Failed - Na in normal range and within 180 days    Sodium  Date Value Ref Range Status  11/21/2022 136 135 - 145 mmol/L Final  06/19/2022 141 134 - 144 mmol/L Final         Failed - K in normal range and within 180 days    Potassium  Date Value Ref Range Status  11/21/2022 3.6 3.5 - 5.1 mmol/L Final         Failed - Cr in normal range and within 180 days    Creat  Date Value Ref Range Status  10/17/2016 0.76 0.50 - 1.05 mg/dL Final    Comment:      For patients > or = 66 years of age: The upper reference limit for Creatinine is approximately 13% higher for people identified as African-American.      Creatinine, Ser  Date Value Ref Range Status  11/21/2022 0.80 0.44 - 1.00 mg/dL Final         Failed - eGFR is 30 or above and within 180 days    GFR, Est African American  Date Value Ref Range Status  10/17/2016 >89 >=60 mL/min Final   GFR calc Af Amer  Date Value Ref Range Status  10/15/2020 97 >59 mL/min/1.73 Final    Comment:    **In accordance with recommendations from the NKF-ASN Task force,**   [facility] is in the process of updating its eGFR calculation to the   2021 CKD-EPI creatinine equation that estimates kidney function   without a race variable.    GFR, Est Non African American  Date Value Ref Range Status  10/17/2016 87 >=60 mL/min Final   GFR, Estimated  Date  Value Ref Range Status  11/21/2022 >60 >60 mL/min Final    Comment:    (NOTE) Calculated using the CKD-EPI Creatinine Equation (2021)    eGFR  Date Value Ref Range Status  06/19/2022 77 >59 mL/min/1.73 Final         Failed - Valid encounter within last 6 months    Recent Outpatient Visits           9 months ago Chronic midline low back pain with bilateral sciatica   [facility] [place]. [facility] Hoy Register, MD   1 year ago Annual visit for general adult medical examination with abnormal findings   [facility] - A Dept Of [place]. [facility] Hoy Register, MD   1 year ago Prediabetes   [facility] [facility] [place]. [facility] Hoy Register, MD   1 year ago Essential hypertension   [facility] Dept Of [place]. [facility] Hoy Register, MD   2 years ago Encounter for screening mammogram for malignant neoplasm of  breast   [facility] [facility] [facility] Dept Of [place]. [facility] Hoy Register, MD              Passed - Patient is not pregnant      Passed - Last BP in normal range    BP Readings from Last 1 Encounters:  01/29/23 127/84          omeprazole (PRILOSEC) 20 MG capsule 60 capsule 0    Sig: Take 1 capsule (20 mg total) by mouth 2 (two) times daily before a meal.     Gastroenterology: Proton Pump Inhibitors Passed - 10/27/2023  3:21 PM      Passed - Valid encounter within last 12 months    Recent Outpatient Visits           9 months ago Chronic midline low back pain with bilateral sciatica   [facility] [place]. [facility] Hoy Register, MD   1 year ago Annual visit for general adult medical examination with abnormal findings   [place] [facility] Of [place]. [facility] Hoy Register, MD   1 year ago  Prediabetes   [facility] Dept Of [place]. [facility] Hoy Register, MD   1 year ago Essential hypertension   [place] [facility] [facility] - [facility] Dept Of [place]. [facility] Hoy Register, MD   2 years ago Encounter for screening mammogram for malignant neoplasm of breast   [place] [facility] - [facility] Dept Of [place]. [facility] Hoy Register, MD

## 2023-10-28 ENCOUNTER — Other Ambulatory Visit: Payer: Self-pay

## 2023-10-28 ENCOUNTER — Other Ambulatory Visit (HOSPITAL_COMMUNITY): Payer: Self-pay

## 2023-10-28 MED ORDER — OMEPRAZOLE 20 MG PO CPDR
20.0000 mg | DELAYED_RELEASE_CAPSULE | Freq: Two times a day (BID) | ORAL | 0 refills | Status: DC
  Filled 2023-10-28 (×2): qty 60, 30d supply, fill #0

## 2023-10-28 MED ORDER — LISINOPRIL-HYDROCHLOROTHIAZIDE 20-25 MG PO TABS
1.0000 | ORAL_TABLET | Freq: Every day | ORAL | 0 refills | Status: DC
  Filled 2023-10-28 (×2): qty 30, 30d supply, fill #0

## 2023-12-02 ENCOUNTER — Other Ambulatory Visit: Payer: Self-pay | Admitting: Family Medicine

## 2023-12-02 ENCOUNTER — Other Ambulatory Visit (HOSPITAL_COMMUNITY): Payer: Self-pay

## 2023-12-02 ENCOUNTER — Ambulatory Visit: Payer: Self-pay | Admitting: Family Medicine

## 2023-12-02 DIAGNOSIS — K219 Gastro-esophageal reflux disease without esophagitis: Secondary | ICD-10-CM

## 2023-12-02 DIAGNOSIS — G894 Chronic pain syndrome: Secondary | ICD-10-CM

## 2023-12-02 DIAGNOSIS — I10 Essential (primary) hypertension: Secondary | ICD-10-CM

## 2023-12-02 NOTE — Telephone Encounter (Signed)
 Noted.

## 2023-12-02 NOTE — Telephone Encounter (Signed)
 Information obtained from son.    Chief Complaint: low back pain that radiates to knees Symptoms: pain Frequency: about 2 weeks Pertinent Negatives: Patient denies injury Disposition: [] ED /[x] Urgent Care (no appt availability in office) / [] Appointment(In office/virtual)/ []  [facility]/ [] Home Care/ [] Refused Recommended Disposition /[] [facility]/ []  Follow-up with PCP Additional Notes: Pt son calling on behalf of pt. Per son pt has had back pain that radiates to bilateral knees. Pt was not injured. Son unsure if mother has GU s/s. Son denies long trips for mother recently. Pt states that tylenol has not been helping. Son requesting medication refills, NT requested med refills. Attempted to sched pt, no appt avail. Pt advised to utilize UC. Son agreeable.  Copied from CRM [id number]. Topic: Clinical - Red Word Triage >> Dec 02, 2023  9:38 AM Turkey B wrote: [facility] that prompted transfer to Nurse Triage: pt's son called in has back and knee pain Reason for Disposition  [1] Pain radiates into the thigh or further down the leg AND [2] both legs  Answer Assessment - Initial Assessment Questions 1. ONSET: "When did the pain begin?"      Almost 2 weeks 2. LOCATION: "Where does it hurt?" (upper, mid or lower back)     Lower back 3. SEVERITY: "How bad is the pain?"  (e.g., Scale 1-10; mild, moderate, or severe)   - MILD (1-3): Doesn't interfere with normal activities.    - MODERATE (4-7): Interferes with normal activities or awakens from sleep.    - SEVERE (8-10): Excruciating pain, unable to do any normal activities.      The worst 4. PATTERN: "Is the pain constant?" (e.g., yes, no; constant, intermittent)      intermittent 5. RADIATION: "Does the pain shoot into your legs or somewhere else?"     Goes into back 6. CAUSE:  "What do you think is causing the back pain?"      unsure 7. BACK OVERUSE:  "Any recent lifting of heavy objects, strenuous work or exercise?"      denies 8. MEDICINES: "What have you taken so far for the pain?" (e.g., nothing, acetaminophen, NSAIDS)     Tylenol-does not really help 9. NEUROLOGIC SYMPTOMS: "Do you have any weakness, numbness, or problems with bowel/bladder control?"     General weakness 10. OTHER SYMPTOMS: "Do you have any other symptoms?" (e.g., fever, abdomen pain, burning with urination, blood in urine)      Caller is unsure if pt is having urinary s/s.  Protocols used: Back Pain-A-AH

## 2023-12-07 ENCOUNTER — Other Ambulatory Visit (HOSPITAL_COMMUNITY): Payer: Self-pay

## 2023-12-09 ENCOUNTER — Other Ambulatory Visit: Payer: Self-pay | Admitting: Family Medicine

## 2023-12-09 ENCOUNTER — Ambulatory Visit
Admission: EM | Admit: 2023-12-09 | Discharge: 2023-12-09 | Disposition: A | Discharge status: Home / Self Care | Attending: Family Medicine | Admitting: Family Medicine

## 2023-12-09 ENCOUNTER — Other Ambulatory Visit (HOSPITAL_COMMUNITY): Payer: Self-pay

## 2023-12-09 DIAGNOSIS — G894 Chronic pain syndrome: Secondary | ICD-10-CM

## 2023-12-09 DIAGNOSIS — K219 Gastro-esophageal reflux disease without esophagitis: Secondary | ICD-10-CM

## 2023-12-09 DIAGNOSIS — I10 Essential (primary) hypertension: Secondary | ICD-10-CM | POA: Diagnosis not present

## 2023-12-09 DIAGNOSIS — M545 Low back pain, unspecified: Secondary | ICD-10-CM | POA: Diagnosis not present

## 2023-12-09 MED ORDER — OMEPRAZOLE 20 MG PO CPDR: 20.0000 mg | DELAYED_RELEASE_CAPSULE | Freq: Two times a day (BID) | ORAL | 1 refills | Status: DC

## 2023-12-09 MED ORDER — GABAPENTIN 300 MG PO CAPS: 300.0000 mg | ORAL_CAPSULE | Freq: Every day | ORAL | 1 refills | Status: DC

## 2023-12-09 MED ORDER — LISINOPRIL-HYDROCHLOROTHIAZIDE 20-25 MG PO TABS: 1.0000 | ORAL_TABLET | Freq: Every day | ORAL | 1 refills | Status: DC

## 2023-12-09 NOTE — Telephone Encounter (Signed)
 Copied from CRM [id number]. Topic: Clinical - Medication Refill >> Dec 09, 2023  8:18 AM Nada Libman H wrote: Most Recent Primary Care Visit:  Provider: Hoy Register  Department[facility]  Visit Type: OFFICE VISIT  Date: 01/29/2023  Medication: omeprazole (PRILOSEC) 20 MG capsule [[id number]] gabapentin (NEURONTIN) 300 MG capsule [[id number]]  Has the patient contacted their pharmacy? No (Agent: If no, request that the patient contact the pharmacy for the refill. If patient does not wish to contact the pharmacy document the reason why and proceed with request.) (Agent: If yes, when and what did the pharmacy advise?)  Is this the correct pharmacy for this prescription? Yes If no, delete pharmacy and type the correct one.  This is the patient's preferred pharmacy:  [facility] [address] [place] [address] Phone: [phone] Fax: [phone]  [facility] [address] [place] [address] Phone: [phone] Fax: [phone]  [id number] - [place], [place] - [address] [place] [address] Phone: [phone] Fax: [phone]   Has the prescription been filled recently? No  Is the patient out of the medication? Yes  Has the patient been seen for an appointment in the last year OR does the patient have an upcoming appointment? Yes  Can we respond through MyChart? No  Agent: Please be advised that Rx refills may take up to 3 business days. We ask that you follow-up with your pharmacy.

## 2023-12-09 NOTE — Discharge Instructions (Signed)
 Your blood pressure was noted to be elevated during your visit today. If you are currently taking medication for high blood pressure, please ensure you are taking this as directed. If you do not have a history of high blood pressure and your blood pressure remains persistently elevated, you may need to begin taking a medication at some point. You may return here within the next few days to recheck if unable to see your primary care provider or if you do not have a one.  BP (S) (!) 177/110 (BP Location: Right Arm) Comment: Out of her BP meds, last dose 03/21  Pulse 89   Temp 99.4 F (37.4 C) (Oral)   Resp 18   SpO2 97%   BP Readings from Last 3 Encounters:  12/09/23 (S) (!) 177/110  01/29/23 127/84  11/22/22 114/82    HOME CARE INSTRUCTIONS: For many people, back pain returns. Since low back pain is rarely dangerous, it is often a condition that people can learn to manage on their own. Please remain active. It is stressful on the back to sit or stand in one place. Do not sit, drive, or stand in one place for more than 30 minutes at a time. Take short walks on level surfaces as soon as pain allows. Try to increase the length of time you walk each day. Do not stay in bed. Resting more than 1 or 2 days can delay your recovery. Do not avoid exercise or work. Your body is made to move. It is not dangerous to be active, even though your back may hurt. Your back will likely heal faster if you return to being active before your pain is gone. Over-the-counter medicines to reduce pain and inflammation are often the most helpful.  SEEK MEDICAL CARE IF: You have pain that is not relieved with rest or medicine. You have pain that does not improve in 1 week. You have new symptoms. You are generally not feeling well.  SEEK IMMEDIATE MEDICAL CARE IF: You have pain that radiates from your back into your legs. You develop new bowel or bladder control problems. You have unusual weakness or numbness in your  arms or legs. You develop nausea or vomiting. You develop abdominal pain. You feel faint.

## 2023-12-09 NOTE — ED Triage Notes (Signed)
 Patient presents to UC with son for back pain and body aches x 5-6 days. Reports back pain radiates to anterior chest/abdomen area.  Treating pain with tylenol. Also requesting medication refill for lisinopril-hydrochlorothiazide. Next appt 05/10.  Denies n/v, diarrhea, urinary symptoms, or abdominal pain.

## 2023-12-09 NOTE — Telephone Encounter (Signed)
 Copied from CRM [id number]. Topic: Clinical - Medication Refill >> Dec 09, 2023  8:23 AM Nada Libman H wrote: Most Recent Primary Care Visit:  Provider: Hoy Register  [facility]  Visit Type: OFFICE VISIT  Date: 01/29/2023  Medication: lisinopril-hydrochlorothiazide (ZESTORETIC) 20-25 MG tablet [[id number]]  Has the patient contacted their pharmacy? No (Agent: If no, request that the patient contact the pharmacy for the refill. If patient does not wish to contact the pharmacy document the reason why and proceed with request.) (Agent: If yes, when and what did the pharmacy advise?)  Is this the correct pharmacy for this prescription? Yes If no, delete pharmacy and type the correct one.  This is the patient's preferred pharmacy:  [facility] [address] [place] [address] Phone: [phone] Fax: [phone]     Has the prescription been filled recently? No  Is the patient out of the medication? Yes  Has the patient been seen for an appointment in the last year OR does the patient have an upcoming appointment? Yes  Can we respond through MyChart? No  Agent: Please be advised that Rx refills may take up to 3 business days. We ask that you follow-up with your pharmacy.

## 2023-12-10 NOTE — ED Provider Notes (Signed)
 [facility]   [id number] 12/09/23 Arrival Time: 1249  ASSESSMENT & PLAN:  1. Bilateral low back pain without sciatica, unspecified chronicity   2. Elevated blood pressure reading with diagnosis of hypertension   3. Chronic pain syndrome    Acute on chronic back pain. Discussed typical duration of likely viral illness. OTC symptom care as needed.  Meds ordered this encounter  Medications   gabapentin (NEURONTIN) 300 MG capsule    Sig: Take 1 capsule (300 mg total) by mouth at bedtime.    Dispense:  30 capsule    Refill:  1   lisinopril-hydrochlorothiazide (ZESTORETIC) 20-25 MG tablet    Sig: Take 1 tablet by mouth daily.    Dispense:  30 tablet    Refill:  1   omeprazole (PRILOSEC) 20 MG capsule    Sig: Take 1 capsule (20 mg total) by mouth 2 (two) times daily before a meal.    Dispense:  60 capsule    Refill:  1     Discharge Instructions      Your blood pressure was noted to be elevated during your visit today. If you are currently taking medication for high blood pressure, please ensure you are taking this as directed. If you do not have a history of high blood pressure and your blood pressure remains persistently elevated, you may need to begin taking a medication at some point. You may return here within the next few days to recheck if unable to see your primary care provider or if you do not have a one.  BP (S) (!) 177/110 (BP Location: Right Arm) Comment: Out of her BP meds, last dose 03/21  Pulse 89   Temp 99.4 F (37.4 C) (Oral)   Resp 18   SpO2 97%   BP Readings from Last 3 Encounters:  12/09/23 (S) (!) 177/110  01/29/23 127/84  11/22/22 114/82    HOME CARE INSTRUCTIONS: For many people, back pain returns. Since low back pain is rarely dangerous, it is often a condition that people can learn to manage on their own. Please remain active. It is stressful on the back to sit or stand in one place. Do not sit, drive, or stand in one place for more than  30 minutes at a time. Take short walks on level surfaces as soon as pain allows. Try to increase the length of time you walk each day. Do not stay in bed. Resting more than 1 or 2 days can delay your recovery. Do not avoid exercise or work. Your body is made to move. It is not dangerous to be active, even though your back may hurt. Your back will likely heal faster if you return to being active before your pain is gone. Over-the-counter medicines to reduce pain and inflammation are often the most helpful.  SEEK MEDICAL CARE IF: You have pain that is not relieved with rest or medicine. You have pain that does not improve in 1 week. You have new symptoms. You are generally not feeling well.  SEEK IMMEDIATE MEDICAL CARE IF: You have pain that radiates from your back into your legs. You develop new bowel or bladder control problems. You have unusual weakness or numbness in your arms or legs. You develop nausea or vomiting. You develop abdominal pain. You feel faint.        Follow-up Information     Hoy Register, MD.   Specialty: Family Medicine Why: Keep your appointment in May. Contact information: [address] [place] [address] [phone]                 Reviewed expectations re: course of current medical issues. Questions answered. Outlined signs and symptoms indicating need for more acute intervention. Understanding verbalized. After Visit Summary given.   SUBJECTIVE: History from: Patient. NIL BOLSER is a 68 y.o. female. Patient presents to UC with son for back pain and body aches x 5-6 days. Reports back pain radiates to anterior chest/abdomen area.  Treating pain with tylenol. Also requesting medication refill for lisinopril-hydrochlorothiazide. Next appt 05/10.  Denies n/v, diarrhea, urinary symptoms, or abdominal pain. H/O chronic back pain. Denies: fever. Normal PO intake without n/v/d. Increased blood pressure noted today.  Reports that she is treated for HTN. Out of meds.  OBJECTIVE:  Vitals:   12/09/23 1414  BP: (S) (!) 177/110  Pulse: 89  Resp: 18  Temp: 99.4 F (37.4 C)  TempSrc: Oral  SpO2: 97%    General appearance: alert; no distress Eyes: PERRLA; EOMI; conjunctiva normal HENT: [place]; AT; without nasal congestion Neck: supple  Back: TTP over bilateral lumbar musculature; denies midline TTP Lungs: speaks full sentences without difficulty; unlabored Extremities: no edema Skin: warm and dry Neurologic: normal gait Psychological: alert and cooperative; normal mood and affect  Labs:  Labs Reviewed  POCT URINALYSIS DIP (MANUAL ENTRY)    No Known Allergies  Past Medical History:  Diagnosis Date   Anginal pain (HCC) 05/30/2012   Chronic pain    right hip   Hypertension    Leg DVT (deep venous thromboembolism), chronic (HCC) 2009; 06/02/2012   "always left"   Shortness of breath 05/30/2012   Tuberculosis ~ 1980   "took medicine for it 6 months"   Social History   Socioeconomic History   Marital status: Married    Spouse name: Not on file   Number of children: Not on file   Years of education: Not on file   Highest education level: Not on file  Occupational History   Not on file  Tobacco Use   Smoking status: Never   Smokeless tobacco: Never  Substance and Sexual Activity   Alcohol use: No   Drug use: No   Sexual activity: Never  Other Topics Concern   Not on file  Social History Narrative   Not on file   Social Drivers of Health   Financial Resource Strain: Not on file  Food Insecurity: Not on file  Transportation Needs: Not on file  Physical Activity: Not on file  Stress: Not on file  Social Connections: Not on file  Intimate Partner Violence: Not on file   History reviewed. No pertinent family history. Past Surgical History:  Procedure Laterality Date   VENA CAVA FILTER PLACEMENT  06/02/12     Mardella Layman, MD 12/10/23 [phone]

## 2023-12-10 NOTE — Telephone Encounter (Signed)
 Duplicate requests, lasted refilled 12/09/23.  Requested Prescriptions  Pending Prescriptions Disp Refills   omeprazole (PRILOSEC) 20 MG capsule 60 capsule 0    Sig: Take 1 capsule (20 mg total) by mouth 2 (two) times daily before a meal. Must have office visit for refills.     Gastroenterology: Proton Pump Inhibitors Passed - 12/10/2023  2:49 PM      Passed - Valid encounter within last 12 months    Recent Outpatient Visits           10 months ago Chronic midline low back pain with bilateral sciatica   [facility] [place]. [facility] Hoy Register, MD   1 year ago Annual visit for general adult medical examination with abnormal findings   [facility] Dept Of [place]. [facility] Hoy Register, MD   1 year ago Prediabetes   [facility] Dept Of [place]. [facility] Hoy Register, MD   2 years ago Essential hypertension   [facility] [place] - A Dept Of [place]. [facility] Hoy Register, MD   2 years ago Encounter for screening mammogram for malignant neoplasm of breast   [facility] Dept Of [place]. [facility] Lemmings, Odette Horns, MD               gabapentin (NEURONTIN) 300 MG capsule 90 capsule 1    Sig: Take 1 capsule (300 mg total) by mouth at bedtime.     Neurology: Anticonvulsants - gabapentin Failed - 12/10/2023  2:49 PM      Failed - Cr in normal range and within 360 days    Creat  Date Value Ref Range Status  10/17/2016 0.76 0.50 - 1.05 mg/dL Final    Comment:      For patients > or = 66 years of age: The upper reference limit for Creatinine is approximately 13% higher for people identified as African-American.      Creatinine, Ser  Date Value Ref Range Status  11/21/2022 0.80 0.44 - 1.00 mg/dL Final         Passed - Completed PHQ-2 or PHQ-9 in the last 360 days      Passed - Valid  encounter within last 12 months    Recent Outpatient Visits           10 months ago Chronic midline low back pain with bilateral sciatica   [facility] [place]. [facility] Hoy Register, MD   1 year ago Annual visit for general adult medical examination with abnormal findings   [facility] - A Dept Of [place]. [facility] Hoy Register, MD   1 year ago Prediabetes   [facility] - [facility] Dept Of [place]. [facility] Hoy Register, MD   2 years ago Essential hypertension   [facility] [place] - A Dept Of [place]. [facility] Hoy Register, MD   2 years ago Encounter for screening mammogram for malignant neoplasm of breast   [facility] [place] - A Dept Of [place]. [facility] Alvis Lemmings, Odette Horns, MD               lisinopril-hydrochlorothiazide (ZESTORETIC) 20-25 MG tablet 30 tablet 0    Sig: Take  1 tablet by mouth daily.Must have office visit for refills.     Cardiovascular:  ACEI + Diuretic Combos Failed - 12/10/2023  2:49 PM      Failed - Na in normal range and within 180 days    Sodium  Date Value Ref Range Status  11/21/2022 136 135 - 145 mmol/L Final  06/19/2022 141 134 - 144 mmol/L Final         Failed - K in normal range and within 180 days    Potassium  Date Value Ref Range Status  11/21/2022 3.6 3.5 - 5.1 mmol/L Final         Failed - Cr in normal range and within 180 days    Creat  Date Value Ref Range Status  10/17/2016 0.76 0.50 - 1.05 mg/dL Final    Comment:      For patients > or = 66 years of age: The upper reference limit for Creatinine is approximately 13% higher for people identified as African-American.      Creatinine, Ser  Date Value Ref Range Status  11/21/2022 0.80 0.44 - 1.00 mg/dL Final         Failed - eGFR is 30 or above and within 180 days    GFR, Est African American  Date Value Ref  Range Status  10/17/2016 >89 >=60 mL/min Final   GFR calc Af Amer  Date Value Ref Range Status  10/15/2020 97 >59 mL/min/1.73 Final    Comment:    **In accordance with recommendations from the NKF-ASN Task force,**   [facility] is in the process of updating its eGFR calculation to the   2021 CKD-EPI creatinine equation that estimates kidney function   without a race variable.    GFR, Est Non African American  Date Value Ref Range Status  10/17/2016 87 >=60 mL/min Final   GFR, Estimated  Date Value Ref Range Status  11/21/2022 >60 >60 mL/min Final    Comment:    (NOTE) Calculated using the CKD-EPI Creatinine Equation (2021)    eGFR  Date Value Ref Range Status  06/19/2022 77 >59 mL/min/1.73 Final         Failed - Last BP in normal range    BP Readings from Last 1 Encounters:  12/09/23 (S) (!) 177/110         Failed - Valid encounter within last 6 months    Recent Outpatient Visits           10 months ago Chronic midline low back pain with bilateral sciatica   [facility] [place]. [facility] Hoy Register, MD   1 year ago Annual visit for general adult medical examination with abnormal findings   [facility] - [facility] Dept Of [place]. [facility] Hoy Register, MD   1 year ago Prediabetes   [facility] Dept Of [place]. [facility] Hoy Register, MD   2 years ago Essential hypertension   [facility] [place] - [facility] Dept Of [place]. [facility] Hoy Register, MD   2 years ago Encounter for screening mammogram for malignant neoplasm of breast   [facility] [place] - [facility] Dept Of [place]. [facility] Hoy Register, MD              Passed - Patient is not pregnant

## 2023-12-25 ENCOUNTER — Other Ambulatory Visit: Payer: Self-pay

## 2024-01-26 ENCOUNTER — Encounter (HOSPITAL_COMMUNITY): Payer: Self-pay | Admitting: Pharmacy Technician

## 2024-01-26 ENCOUNTER — Emergency Department (HOSPITAL_COMMUNITY)
Admission: EM | Admit: 2024-01-26 | Discharge: 2024-01-26 | Disposition: A | Discharge status: Home / Self Care | Attending: Emergency Medicine | Admitting: Emergency Medicine

## 2024-01-26 ENCOUNTER — Emergency Department (HOSPITAL_COMMUNITY)

## 2024-01-26 ENCOUNTER — Other Ambulatory Visit: Payer: Self-pay

## 2024-01-26 DIAGNOSIS — W19XXXA Unspecified fall, initial encounter: Secondary | ICD-10-CM | POA: Diagnosis not present

## 2024-01-26 DIAGNOSIS — S0083XA Contusion of other part of head, initial encounter: Secondary | ICD-10-CM | POA: Insufficient documentation

## 2024-01-26 DIAGNOSIS — R42 Dizziness and giddiness: Secondary | ICD-10-CM

## 2024-01-26 DIAGNOSIS — M25521 Pain in right elbow: Secondary | ICD-10-CM | POA: Diagnosis not present

## 2024-01-26 DIAGNOSIS — S80211A Abrasion, right knee, initial encounter: Secondary | ICD-10-CM | POA: Insufficient documentation

## 2024-01-26 DIAGNOSIS — S0990XA Unspecified injury of head, initial encounter: Secondary | ICD-10-CM | POA: Diagnosis present

## 2024-01-26 DIAGNOSIS — I10 Essential (primary) hypertension: Secondary | ICD-10-CM | POA: Insufficient documentation

## 2024-01-26 LAB — CBC
HCT: 36.4 % (ref 36.0–46.0)
Hemoglobin: 12 g/dL (ref 12.0–15.0)
MCH: 29.8 pg (ref 26.0–34.0)
MCHC: 33 g/dL (ref 30.0–36.0)
MCV: 90.3 fL (ref 80.0–100.0)
Platelets: 225 10*3/uL (ref 150–400)
RBC: 4.03 MIL/uL (ref 3.87–5.11)
RDW: 13.9 % (ref 11.5–15.5)
WBC: 8.5 10*3/uL (ref 4.0–10.5)
nRBC: 0 % (ref 0.0–0.2)

## 2024-01-26 LAB — BASIC METABOLIC PANEL WITH GFR
Anion gap: 11 (ref 5–15)
BUN: 11 mg/dL (ref 8–23)
CO2: 25 mmol/L (ref 22–32)
Calcium: 8.9 mg/dL (ref 8.9–10.3)
Chloride: 100 mmol/L (ref 98–111)
Creatinine, Ser: 0.92 mg/dL (ref 0.44–1.00)
GFR, Estimated: >60 mL/min (ref >60)
Glucose, Bld: 235 mg/dL — ABNORMAL HIGH (ref 70–99)
Potassium: 3.5 mmol/L (ref 3.5–5.1)
Sodium: 136 mmol/L (ref 135–145)

## 2024-01-26 MED ORDER — ACETAMINOPHEN 500 MG PO TABS
1000.0000 mg | ORAL_TABLET | Freq: Once | ORAL | Status: AC
  Administered 2024-01-26: 1000 mg via ORAL
  Filled 2024-01-26: qty 2

## 2024-01-26 NOTE — ED Provider Notes (Signed)
 [place] EMERGENCY DEPARTMENT AT [facility] Provider Note   CSN: [id number] Arrival date & time: 01/26/24  1614     History  Chief Complaint  Patient presents with   Ashley Jacobs    Ashley Jacobs is a 68 y.o. female.  Patient is a 68 year old female with a history of DVT, hypertension, chronic right hip pain who is no longer on anticoagulation presenting today after she fell.  Her family member reports that the patient has been fasting and she has only been eating at night so she has not been taking her blood pressure medication as prescribed because she does not want to take it on an empty stomach.  This morning around 10:00 she had stood up to walk across her living room and felt a little bit dizzy but it resolved.  She then attempted to go out into the parking lot and got so dizzy it caused her to fall forward face first onto the concrete.  She denies any loss of consciousness, nausea or vomiting.  She is complaining of pain in her face and her teeth but denies visual changes or headache.  She is also having pain in her right elbow.  She denies any rib pain or shortness of breath.  No chest pain.  She reports that after falling her blood pressure was elevated and she took her blood pressure medications and denies any dizziness at this time.  The history is provided by the patient and a relative. The history is limited by a language barrier (nepali). A language interpreter was used.  Fall This is a new problem.       Home Medications Prior to Admission medications   Medication Sig Start Date End Date Taking? Authorizing Provider  cetirizine  (ZYRTEC ) 10 MG tablet Take 1 tablet (10 mg total) by mouth daily. 11/12/21   Newlin, Enobong, MD  diclofenac  Sodium (VOLTAREN ) 1 % GEL Apply 4 g topically 4 (four) times daily. 01/29/23   Newlin, Enobong, MD  DULoxetine  (CYMBALTA ) 60 MG capsule Take 1 capsule (60 mg total) by mouth daily for back pain 01/29/23   Newlin, Enobong, MD   gabapentin  (NEURONTIN ) 300 MG capsule Take 1 capsule (300 mg total) by mouth at bedtime. 09/28/23   Newlin, Enobong, MD  gabapentin  (NEURONTIN ) 300 MG capsule Take 1 capsule (300 mg total) by mouth at bedtime. 12/09/23   Afton Albright, MD  hydrocortisone  cream 0.5 % Apply 1 Application topically 2 (two) times daily. 07/31/22   Newlin, Enobong, MD  lidocaine  (LIDODERM ) 5 % Place 1 patch onto the skin daily. Remove & Discard patch within 12 hours or as directed by MD 01/29/23   Joaquin Mulberry, MD  lisinopril -hydrochlorothiazide  (ZESTORETIC ) 20-25 MG tablet Take 1 tablet by mouth daily. 12/09/23   Afton Albright, MD  methocarbamol  (ROBAXIN ) 500 MG tablet Take 1 tablet (500 mg total) by mouth every 8 (eight) hours as needed for muscle spasms. 01/29/23   Newlin, Enobong, MD  omeprazole  (PRILOSEC) 20 MG capsule Take 1 capsule (20 mg total) by mouth 2 (two) times daily before a meal. 12/09/23   Afton Albright, MD  ondansetron  (ZOFRAN -ODT) 4 MG disintegrating tablet Take 1 tablet (4 mg total) by mouth every 8 (eight) hours as needed for nausea or vomiting. 11/22/22   Carleton Cheek, PA-C      Allergies    Patient has no known allergies.    Review of Systems   Review of Systems  Physical Exam Updated Vital Signs BP 133/76 (BP Location: Left  Arm)   Pulse 96   Temp 98.5 F (36.9 C)   Resp 18   SpO2 93%  Physical Exam Vitals and nursing note reviewed.  Constitutional:      General: She is not in acute distress.    Appearance: She is well-developed.  HENT:     Head: Normocephalic and atraumatic.      Comments: Abrasions noted to the left cheek and above the left eye with some surrounding swelling.  Also some tenderness with palpation of her central and lateral incisor on the left upper Eyes:     Pupils: Pupils are equal, round, and reactive to light.  Cardiovascular:     Rate and Rhythm: Normal rate and regular rhythm.     Heart sounds: Normal heart sounds. No murmur heard.    No friction rub.   Pulmonary:     Effort: Pulmonary effort is normal.     Breath sounds: Normal breath sounds. No wheezing or rales.  Abdominal:     General: Bowel sounds are normal. There is no distension.     Palpations: Abdomen is soft.     Tenderness: There is no abdominal tenderness. There is no guarding or rebound.  Musculoskeletal:        General: Tenderness present. Normal range of motion.     Right elbow: Swelling present. No deformity. Tenderness present in medial epicondyle, lateral epicondyle and olecranon process.     Right knee: No swelling. Normal range of motion. Tenderness present.       Legs:     Comments: No edema  Skin:    General: Skin is warm and dry.     Findings: No rash.  Neurological:     Mental Status: She is alert and oriented to person, place, and time. Mental status is at baseline.     Cranial Nerves: No cranial nerve deficit.     Sensory: No sensory deficit.     Motor: No weakness.     Gait: Gait normal.  Psychiatric:        Behavior: Behavior normal.     ED Results / Procedures / Treatments   Labs (all labs ordered are listed, but only abnormal results are displayed) Labs Reviewed  BASIC METABOLIC PANEL WITH GFR - Abnormal; Notable for the following components:      Result Value   Glucose, Bld 235 (*)    All other components within normal limits  CBC  HEMOGLOBIN A1C    EKG None  Radiology CT Head Wo Contrast Result Date: 01/26/2024 CLINICAL DATA:  Fall with facial trauma EXAM: CT HEAD WITHOUT CONTRAST CT MAXILLOFACIAL WITHOUT CONTRAST TECHNIQUE: Multidetector CT imaging of the head and maxillofacial structures were performed using the standard protocol without intravenous contrast. Multiplanar CT image reconstructions of the maxillofacial structures were also generated. RADIATION DOSE REDUCTION: This exam was performed according to the departmental dose-optimization program which includes automated exposure control, adjustment of the mA and/or kV according  to patient size and/or use of iterative reconstruction technique. COMPARISON:  CT brain 02/22/2014 FINDINGS: CT HEAD FINDINGS Brain: No evidence of acute infarction, hemorrhage, hydrocephalus, extra-axial collection or mass lesion/mass effect. Vascular: No hyperdense vessel or unexpected calcification. Skull: Normal. Negative for fracture or focal lesion. Other: None CT MAXILLOFACIAL FINDINGS Osseous: Mastoid air cells are clear. Mandibular heads are normally position. No mandibular fracture. Pterygoid plates and zygomatic arches appear intact. No acute nasal bone fracture. Orbits: Negative. No traumatic or inflammatory finding. Sinuses: No acute sinus wall fracture.  No  acute fluid levels Soft tissues: Negative. IMPRESSION: 1. Negative non contrasted CT appearance of the brain. 2. Negative for acute facial bone fracture. Electronically Signed   By: Esmeralda Hedge M.D.   On: 01/26/2024 18:52   CT Maxillofacial Wo Contrast Result Date: 01/26/2024 CLINICAL DATA:  Fall with facial trauma EXAM: CT HEAD WITHOUT CONTRAST CT MAXILLOFACIAL WITHOUT CONTRAST TECHNIQUE: Multidetector CT imaging of the head and maxillofacial structures were performed using the standard protocol without intravenous contrast. Multiplanar CT image reconstructions of the maxillofacial structures were also generated. RADIATION DOSE REDUCTION: This exam was performed according to the departmental dose-optimization program which includes automated exposure control, adjustment of the mA and/or kV according to patient size and/or use of iterative reconstruction technique. COMPARISON:  CT brain 02/22/2014 FINDINGS: CT HEAD FINDINGS Brain: No evidence of acute infarction, hemorrhage, hydrocephalus, extra-axial collection or mass lesion/mass effect. Vascular: No hyperdense vessel or unexpected calcification. Skull: Normal. Negative for fracture or focal lesion. Other: None CT MAXILLOFACIAL FINDINGS Osseous: Mastoid air cells are clear. Mandibular heads  are normally position. No mandibular fracture. Pterygoid plates and zygomatic arches appear intact. No acute nasal bone fracture. Orbits: Negative. No traumatic or inflammatory finding. Sinuses: No acute sinus wall fracture.  No acute fluid levels Soft tissues: Negative. IMPRESSION: 1. Negative non contrasted CT appearance of the brain. 2. Negative for acute facial bone fracture. Electronically Signed   By: Esmeralda Hedge M.D.   On: 01/26/2024 18:52   DG Elbow Complete Right Result Date: 01/26/2024 CLINICAL DATA:  pain post fall EXAM: RIGHT ELBOW - COMPLETE 3+ VIEW COMPARISON:  None Available. FINDINGS: No acute fracture or dislocation. No joint effusion. There is no evidence of arthropathy or other focal bone abnormality. Soft tissues are unremarkable. IMPRESSION: No acute fracture or dislocation. Electronically Signed   By: Rance Burrows M.D.   On: 01/26/2024 17:09    Procedures Procedures    Medications Ordered in ED Medications  acetaminophen  (TYLENOL ) tablet 1,000 mg (1,000 mg Oral Given 01/26/24 1829)    ED Course/ Medical Decision Making/ A&P                                 Medical Decision Making Amount and/or Complexity of Data Reviewed Labs: ordered. Decision-making details documented in ED Course. Radiology: ordered and independent interpretation performed. Decision-making details documented in ED Course.  Risk OTC drugs.   Pt with multiple medical problems and comorbidities and presenting today with a complaint that caries a high risk for morbidity and mortality.  Here today after a fall as a result of dizziness.  Patient reports the dizziness has resolved.  Unclear exactly what caused the dizziness.  Could be orthostasis as patient has been fasting and seemed to occur when she would stand up.  Also could be BPPV but patient is currently asymptomatic.  Also could be related to her blood pressure but she has now taken her blood pressure medications and blood pressure is normal.   Also concern for anemia, electrolyte disturbance, hyperglycemia. I independently interpreted patient's labs and BMP does show a blood sugar of 235 but otherwise normal, CBC within normal limits. I have independently visualized and interpreted pt's images today.  Head CT was negative for acute bleed or facial fractures.  Radiology reports negative imaging.  Elbow film is negative for fracture.  Did send a hemoglobin A1c due to patient's hyperglycemia she has a follow-up appointment with her doctor on Thursday.  At this time patient appears stable for discharge home and was instructed to return if the dizziness returns or becomes persistent.  Patient and her family member are comfortable with this plan.        Final Clinical Impression(s) / ED Diagnoses Final diagnoses:  Fall, initial encounter  Contusion of face, initial encounter  Dizziness    Rx / DC Orders ED Discharge Orders     None         Almond Army, MD 01/26/24 1926

## 2024-01-26 NOTE — Discharge Instructions (Signed)
 The blood sugar was elevated today and she can follow-up with her doctor on Thursday about that.  Also she should take her blood pressure medication as prescribed and she may need to eat a little something.  She needs to stay hydrated.  The x-rays today do not show any signs of internal bleeding or broken bones.  She can use Vaseline on the wound of the face until it is healed.  She can use the sling if she needs for comfort but she needs to make sure she is straightening it out several times a day.  She can take 2 Tylenol  and 2 ibuprofen  together every 6 hours as needed for pain

## 2024-01-26 NOTE — ED Notes (Signed)
 Patient transported to CT

## 2024-01-26 NOTE — ED Triage Notes (Signed)
 Pt here with reports of falling earlier today with pain to R elbow. Unknown why pt fell. Family reports pt has  been fasting.

## 2024-01-28 ENCOUNTER — Encounter: Payer: Self-pay | Admitting: Family Medicine

## 2024-01-28 ENCOUNTER — Ambulatory Visit: Attending: Family Medicine | Admitting: Family Medicine

## 2024-01-28 VITALS — BP 108/74 | HR 88 | Ht 61.0 in | Wt 156.8 lb

## 2024-01-28 DIAGNOSIS — M5442 Lumbago with sciatica, left side: Secondary | ICD-10-CM | POA: Diagnosis not present

## 2024-01-28 DIAGNOSIS — E119 Type 2 diabetes mellitus without complications: Secondary | ICD-10-CM | POA: Insufficient documentation

## 2024-01-28 DIAGNOSIS — I10 Essential (primary) hypertension: Secondary | ICD-10-CM | POA: Diagnosis not present

## 2024-01-28 DIAGNOSIS — M5441 Lumbago with sciatica, right side: Secondary | ICD-10-CM

## 2024-01-28 DIAGNOSIS — G8929 Other chronic pain: Secondary | ICD-10-CM

## 2024-01-28 DIAGNOSIS — Z79899 Other long term (current) drug therapy: Secondary | ICD-10-CM

## 2024-01-28 DIAGNOSIS — I152 Hypertension secondary to endocrine disorders: Secondary | ICD-10-CM

## 2024-01-28 DIAGNOSIS — Z1231 Encounter for screening mammogram for malignant neoplasm of breast: Secondary | ICD-10-CM

## 2024-01-28 DIAGNOSIS — Z1211 Encounter for screening for malignant neoplasm of colon: Secondary | ICD-10-CM

## 2024-01-28 DIAGNOSIS — G894 Chronic pain syndrome: Secondary | ICD-10-CM

## 2024-01-28 DIAGNOSIS — K219 Gastro-esophageal reflux disease without esophagitis: Secondary | ICD-10-CM

## 2024-01-28 DIAGNOSIS — E1169 Type 2 diabetes mellitus with other specified complication: Secondary | ICD-10-CM

## 2024-01-28 LAB — HEMOGLOBIN A1C
Hgb A1c MFr Bld: 6.7 % — ABNORMAL HIGH (ref 4.8–5.6)
Mean Plasma Glucose: 146 mg/dL

## 2024-01-28 MED ORDER — ATORVASTATIN CALCIUM 20 MG PO TABS
20.0000 mg | ORAL_TABLET | Freq: Every day | ORAL | 1 refills | Status: AC
Start: 2024-01-28 — End: ?

## 2024-01-28 MED ORDER — GABAPENTIN 300 MG PO CAPS: 300.0000 mg | ORAL_CAPSULE | Freq: Every day | ORAL | 1 refills | Status: DC

## 2024-01-28 MED ORDER — ACCU-CHEK AVIVA PLUS W/DEVICE KIT: 1.0000 | PACK | Freq: Every day | 0 refills | Status: AC

## 2024-01-28 MED ORDER — ACCU-CHEK AVIVA PLUS VI STRP
ORAL_STRIP | 12 refills | Status: AC
Start: 2024-01-28 — End: ?

## 2024-01-28 MED ORDER — DULOXETINE HCL 60 MG PO CPEP: 60.0000 mg | ORAL_CAPSULE | Freq: Every day | ORAL | 1 refills | Status: DC

## 2024-01-28 MED ORDER — LISINOPRIL-HYDROCHLOROTHIAZIDE 20-25 MG PO TABS: 1.0000 | ORAL_TABLET | Freq: Every day | ORAL | 1 refills | Status: DC

## 2024-01-28 MED ORDER — OMEPRAZOLE 20 MG PO CPDR: 20.0000 mg | DELAYED_RELEASE_CAPSULE | Freq: Two times a day (BID) | ORAL | 1 refills | Status: DC

## 2024-01-28 NOTE — Patient Instructions (Signed)

## 2024-01-28 NOTE — Progress Notes (Signed)
 Subjective:  Patient ID: Ashley Jacobs, female    DOB: Aug 03, 1956  Age: 68 y.o. MRN: [id number]  CC: Medical Management of Chronic Issues (No concerns)     Discussed the use of AI scribe software for clinical note transcription with the patient, who gave verbal consent to proceed.  History of Present Illness Ashley Jacobs is a 68 year old female with a history of hypertension, GERD, chronic pain. who presents with right arm pain following a fall.  She experienced dizziness due to missed blood pressure medication and fasting , leading to a fall in the parking area two days ago. This resulted in right arm pain. An x-ray showed no fracture. Pain occurs with arm movement, especially when hanging down, and she uses a sling for support. Tylenol  alleviates the pain, improving mobility. She also has a facial wound from the fall, which is currently painful.   Her medical history includes a new diagnosis of diabetes with an A1c of 6.7 which was diagnosed at her ED visit 2 days ago.  Chronic back pain managed with duloxetine  and gabapentin , and acid reflux treated with omeprazole .  Blood pressure is controlled on lisinopril /HCTZ.    Past Medical History:  Diagnosis Date   Anginal pain (HCC) 05/30/2012   Chronic pain    right hip   Hypertension    Leg DVT (deep venous thromboembolism), chronic (HCC) 2009; 06/02/2012   "always left"   Shortness of breath 05/30/2012   Tuberculosis ~ 1980   "took medicine for it 6 months"    Past Surgical History:  Procedure Laterality Date   VENA CAVA FILTER PLACEMENT  06/02/12    No family history on file.  Social History   Socioeconomic History   Marital status: Married    Spouse name: Not on file   Number of children: Not on file   Years of education: Not on file   Highest education level: Not on file  Occupational History   Not on file  Tobacco Use   Smoking status: Never   Smokeless tobacco: Never  Substance and Sexual Activity    Alcohol use: No   Drug use: No   Sexual activity: Never  Other Topics Concern   Not on file  Social History Narrative   Not on file   Social Drivers of Health   Financial Resource Strain: Not on file  Food Insecurity: Not on file  Transportation Needs: Not on file  Physical Activity: Not on file  Stress: Not on file  Social Connections: Not on file    No Known Allergies  Outpatient Medications Prior to Visit  Medication Sig Dispense Refill   gabapentin  (NEURONTIN ) 300 MG capsule Take 1 capsule (300 mg total) by mouth at bedtime. 30 capsule 1   lisinopril -hydrochlorothiazide  (ZESTORETIC ) 20-25 MG tablet Take 1 tablet by mouth daily. 30 tablet 1   omeprazole  (PRILOSEC) 20 MG capsule Take 1 capsule (20 mg total) by mouth 2 (two) times daily before a meal. 60 capsule 1   cetirizine  (ZYRTEC ) 10 MG tablet Take 1 tablet (10 mg total) by mouth daily. (Patient not taking: Reported on 01/28/2024) 30 tablet 1   methocarbamol  (ROBAXIN ) 500 MG tablet Take 1 tablet (500 mg total) by mouth every 8 (eight) hours as needed for muscle spasms. (Patient not taking: Reported on 01/28/2024) 180 tablet 1   diclofenac  Sodium (VOLTAREN ) 1 % GEL Apply 4 g topically 4 (four) times daily. (Patient not taking: Reported on 01/28/2024) 100 g 1  DULoxetine  (CYMBALTA ) 60 MG capsule Take 1 capsule (60 mg total) by mouth daily for back pain (Patient not taking: Reported on 01/28/2024) 90 capsule 1   gabapentin  (NEURONTIN ) 300 MG capsule Take 1 capsule (300 mg total) by mouth at bedtime. (Patient not taking: Reported on 01/28/2024) 90 capsule 1   hydrocortisone  cream 0.5 % Apply 1 Application topically 2 (two) times daily. (Patient not taking: Reported on 01/28/2024) 30 g 1   lidocaine  (LIDODERM ) 5 % Place 1 patch onto the skin daily. Remove & Discard patch within 12 hours or as directed by MD (Patient not taking: Reported on 01/28/2024) 30 patch 3   ondansetron  (ZOFRAN -ODT) 4 MG disintegrating tablet Take 1 tablet (4 mg  total) by mouth every 8 (eight) hours as needed for nausea or vomiting. (Patient not taking: Reported on 01/28/2024) 10 tablet 0   No facility-administered medications prior to visit.     ROS Review of Systems  Constitutional:  Negative for activity change and appetite change.  HENT:  Negative for sinus pressure and sore throat.   Respiratory:  Negative for chest tightness, shortness of breath and wheezing.   Cardiovascular:  Negative for chest pain and palpitations.  Gastrointestinal:  Negative for abdominal distention, abdominal pain and constipation.  Genitourinary: Negative.   Musculoskeletal:        See HPI  Psychiatric/Behavioral:  Negative for behavioral problems and dysphoric mood.     Objective:  BP 108/74   Pulse 88   Ht 5\' 1"  (1.549 m)   Wt 156 lb 12.8 oz (71.1 kg)   SpO2 97%   BMI 29.63 kg/m      01/28/2024   10:25 AM 01/26/2024    7:31 PM 01/26/2024    4:23 PM  BP/Weight  Systolic BP 108 138 133  Diastolic BP 74 88 76  Wt. (Lbs) 156.8    BMI 29.63 kg/m2        Physical Exam Constitutional:      Appearance: She is well-developed.  Cardiovascular:     Rate and Rhythm: Normal rate.     Heart sounds: Normal heart sounds. No murmur heard. Pulmonary:     Effort: Pulmonary effort is normal.     Breath sounds: Normal breath sounds. No wheezing or rales.  Chest:     Chest wall: No tenderness.  Abdominal:     General: Bowel sounds are normal. There is no distension.     Palpations: Abdomen is soft. There is no mass.     Tenderness: There is no abdominal tenderness.  Musculoskeletal:        General: Normal range of motion.     Right lower leg: No edema.     Left lower leg: No edema.     Comments: Right arm in a sling  Skin:    Comments: Bruises on face  Neurological:     Mental Status: She is alert and oriented to person, place, and time.  Psychiatric:        Mood and Affect: Mood normal.        Latest Ref Rng & Units 01/26/2024    4:26 PM  11/21/2022    9:24 PM 11/21/2022    8:12 PM  CMP  Glucose 70 - 99 mg/dL 161  096  045   BUN 8 - 23 mg/dL 11  11  10    Creatinine 0.44 - 1.00 mg/dL 4.09  8.11  9.14   Sodium 135 - 145 mmol/L 136  136  133  Potassium 3.5 - 5.1 mmol/L 3.5  3.6  3.7   Chloride 98 - 111 mmol/L 100  99  101   CO2 22 - 32 mmol/L 25   23   Calcium 8.9 - 10.3 mg/dL 8.9   8.8   Total Protein 6.5 - 8.1 g/dL   7.5   Total Bilirubin 0.3 - 1.2 mg/dL   1.0   Alkaline Phos 38 - 126 U/L   72   AST 15 - 41 U/L   28   ALT 0 - 44 U/L   25     Lipid Panel     Component Value Date/Time   CHOL 143 11/12/2021 1626   TRIG 109 11/12/2021 1626   HDL 44 11/12/2021 1626   CHOLHDL 3.5 02/20/2021 1133   CHOLHDL 4.0 10/17/2016 0836   VLDL 24 11/16/2015 1051   LDLCALC 79 11/12/2021 1626    CBC    Component Value Date/Time   WBC 8.5 01/26/2024 1626   RBC 4.03 01/26/2024 1626   HGB 12.0 01/26/2024 1626   HGB 11.8 02/20/2021 1133   HCT 36.4 01/26/2024 1626   HCT 36.2 02/20/2021 1133   PLT 225 01/26/2024 1626   PLT 215 02/20/2021 1133   MCV 90.3 01/26/2024 1626   MCV 92 02/20/2021 1133   MCH 29.8 01/26/2024 1626   MCHC 33.0 01/26/2024 1626   RDW 13.9 01/26/2024 1626   RDW 13.9 02/20/2021 1133   LYMPHSABS 3.1 11/21/2022 2012   LYMPHSABS 1.9 02/20/2021 1133   MONOABS 1.2 (H) 11/21/2022 2012   EOSABS 0.1 11/21/2022 2012   EOSABS 0.1 02/20/2021 1133   BASOSABS 0.0 11/21/2022 2012   BASOSABS 0.0 02/20/2021 1133    Lab Results  Component Value Date   HGBA1C 6.7 (H) 01/26/2024       Assessment & Plan 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 -Discussed pathophysiology of diabetes mellitus - She prefers lifestyle modifications before medication. Discussed dietary changes, reducing carbohydrate intake, and regular physical activity. Explained need for cholesterol medication to reduce cardiovascular risk. - Encourage dietary modifications to reduce carbohydrate intake. -  Encourage regular physical activity. - Order glucometer for home blood glucose monitoring. - Instruct to check blood glucose before meals, 2-3 times a week. - Prescribe cholesterol medication to reduce cardiovascular risk. - Refer to ophthalmology for annual diabetic eye exam. - Schedule follow-up in 3 months to reassess A1c and diabetes management. -Counseled on Diabetic diet, my plate method, 119 minutes of moderate intensity exercise/week Blood sugar logs with fasting goals of 80-120 mg/dl, random of less than 147 and in the event of sugars less than 60 mg/dl or greater than 829 mg/dl encouraged to notify the clinic. Advised on the need for annual eye exams, annual foot exams, Pneumonia vaccine.   Essential hypertension Blood pressure normal. Recent dizziness possibly due to delayed medication intake. Discussed importance of timely medication intake. - Refill antihypertensive medication. -Counseled on blood pressure goal of less than 130/80, low-sodium, DASH diet, medication compliance, 150 minutes of moderate intensity exercise per week. Discussed medication compliance, adverse effects.   Chronic midline low back pain with bilateral sciatica Managed with duloxetine  and gabapentin . She requests refills for potential pain flare-ups. - Refill duloxetine  and gabapentin  prescriptions.  Gastroesophageal reflux disease without esophagitis Managed with omeprazole . Reports chest pain if medication is not taken. Clarified dosing to twice daily. - Continue omeprazole  twice daily.   Healthcare maintenance - Order placed for screening mammogram - Order placed  for colonoscopy      Meds ordered this encounter  Medications   lisinopril -hydrochlorothiazide  (ZESTORETIC ) 20-25 MG tablet    Sig: Take 1 tablet by mouth daily.    Dispense:  90 tablet    Refill:  1   atorvastatin (LIPITOR) 20 MG tablet    Sig: Take 1 tablet (20 mg total) by mouth daily.    Dispense:  90 tablet    Refill:  1    DULoxetine  (CYMBALTA ) 60 MG capsule    Sig: Take 1 capsule (60 mg total) by mouth daily for back pain    Dispense:  90 capsule    Refill:  1   gabapentin  (NEURONTIN ) 300 MG capsule    Sig: Take 1 capsule (300 mg total) by mouth at bedtime.    Dispense:  30 capsule    Refill:  1   omeprazole  (PRILOSEC) 20 MG capsule    Sig: Take 1 capsule (20 mg total) by mouth 2 (two) times daily before a meal.    Dispense:  180 capsule    Refill:  1   glucose blood (ACCU-CHEK AVIVA PLUS) test strip    Sig: Use as instructed daily    Dispense:  100 each    Refill:  12   Blood Glucose Monitoring Suppl (ACCU-CHEK AVIVA PLUS) w/Device KIT    Sig: 1 each by Does not apply route daily.    Dispense:  1 kit    Refill:  0    Follow-up: Return in about 3 months (around 04/29/2024) for Chronic medical conditions.       Joaquin Mulberry, MD, FAAFP. [facility] [place], [place] [phone]   01/28/2024, 11:06 AM

## 2024-02-17 ENCOUNTER — Ambulatory Visit

## 2024-03-31 ENCOUNTER — Telehealth: Payer: Self-pay | Admitting: Family Medicine

## 2024-03-31 NOTE — Telephone Encounter (Signed)
 Called pt to confirm appt. Pt did not answer and LVM

## 2024-05-02 ENCOUNTER — Ambulatory Visit: Admitting: Family Medicine

## 2024-05-04 ENCOUNTER — Other Ambulatory Visit: Payer: Self-pay | Admitting: Family Medicine

## 2024-05-04 DIAGNOSIS — G8929 Other chronic pain: Secondary | ICD-10-CM

## 2024-06-21 ENCOUNTER — Telehealth: Payer: Self-pay | Admitting: Family Medicine

## 2024-06-21 NOTE — Telephone Encounter (Signed)
 Called pt to reschedule appt. Pt did not aswer and LVM for pt to call back so that we can reschedule.

## 2024-06-29 ENCOUNTER — Ambulatory Visit: Admitting: Family Medicine

## 2024-07-18 ENCOUNTER — Ambulatory Visit: Payer: Self-pay

## 2024-07-18 NOTE — Telephone Encounter (Signed)
 Noted

## 2024-07-18 NOTE — Telephone Encounter (Signed)
 FYI Only or Action Required?: Action required by provider: request for appointment.  Patient was last seen in primary care on 01/28/2024 by Newlin, Enobong, MD.  Called Nurse Triage reporting Chest Pain.  Symptoms began chronic.  Interventions attempted: Nothing.  Symptoms are: unchanged.  Triage Disposition: See PCP When Office is Open (Within 3 Days)  Patient/caregiver understands and will follow disposition?: Yes, but will wait    Copied from CRM #[id number]. Topic: Clinical - Red Word Triage >> Jul 18, 2024 11:46 AM Victoria B wrote: [facility] that prompted transfer to Nurse Triage: Patient has chest pain Reason for Disposition  [1] Chest pain from known angina comes and goes AND [2] is NOT happening more often (increasing in frequency) or getting worse (increasing in severity)  Answer Assessment - Initial Assessment Questions Additional info: 1) Requesting office visit for medication refills and discuss chronic intermittent chest pain and chronic body pain. Her appointment in October was cancelled by office. Next OV with pcp scheduled on 08/31/24. Added acute visit to wait list for acute appointment within 3 days. Discussed ED precautions.   1. LOCATION: Where does it hurt?       All over 2. RADIATION: Does the pain go anywhere else? (e.g., into neck, jaw, arms, back)     Denies  3. ONSET: When did the chest pain begin? (Minutes, hours or days)      Several months  4. PATTERN: Does the pain come and go, or has it been constant since it started?  Does it get worse with exertion?      Intermittent chronic  5. DURATION: How long does it last (e.g., seconds, minutes, hours)     Varies  6. SEVERITY: How bad is the pain?  (e.g., Scale 1-10; mild, moderate, or severe)     Moderate  7. CARDIAC RISK FACTORS: Do you have any history of heart problems or risk factors for heart disease? (e.g., angina, prior heart attack; diabetes, high blood pressure, high cholesterol,  smoker, or strong family history of heart disease)     Htn  8. PULMONARY RISK FACTORS: Do you have any history of lung disease?  (e.g., blood clots in lung, asthma, emphysema, birth control pills)      9. CAUSE: What do you think is causing the chest pain?     unsure 10. OTHER SYMPTOMS: Do you have any other symptoms? (e.g., dizziness, nausea, vomiting, sweating, fever, difficulty breathing, cough)       Nausea intermittent, headache  Protocols used: Chest Pain-A-AH

## 2024-07-27 ENCOUNTER — Other Ambulatory Visit: Payer: Self-pay | Admitting: Family Medicine

## 2024-08-18 ENCOUNTER — Ambulatory Visit
Admission: EM | Admit: 2024-08-18 | Discharge: 2024-08-18 | Disposition: A | Discharge status: Other Acute Inpatient Hospital | Attending: Family Medicine | Admitting: Family Medicine

## 2024-08-18 ENCOUNTER — Emergency Department (HOSPITAL_COMMUNITY)

## 2024-08-18 ENCOUNTER — Encounter (HOSPITAL_COMMUNITY): Payer: Self-pay

## 2024-08-18 ENCOUNTER — Emergency Department (HOSPITAL_COMMUNITY)
Admission: EM | Admit: 2024-08-18 | Discharge: 2024-08-18 | Disposition: A | Discharge status: Home / Self Care | Attending: Emergency Medicine | Admitting: Emergency Medicine

## 2024-08-18 ENCOUNTER — Other Ambulatory Visit: Payer: Self-pay

## 2024-08-18 DIAGNOSIS — R519 Headache, unspecified: Secondary | ICD-10-CM | POA: Diagnosis present

## 2024-08-18 DIAGNOSIS — I10 Essential (primary) hypertension: Secondary | ICD-10-CM | POA: Insufficient documentation

## 2024-08-18 DIAGNOSIS — Z79899 Other long term (current) drug therapy: Secondary | ICD-10-CM | POA: Diagnosis not present

## 2024-08-18 DIAGNOSIS — I1A0 Resistant hypertension: Secondary | ICD-10-CM | POA: Diagnosis not present

## 2024-08-18 DIAGNOSIS — R079 Chest pain, unspecified: Secondary | ICD-10-CM | POA: Insufficient documentation

## 2024-08-18 LAB — CBC
HCT: 38.5 % (ref 36.0–46.0)
Hemoglobin: 12.4 g/dL (ref 12.0–15.0)
MCH: 29.1 pg (ref 26.0–34.0)
MCHC: 32.2 g/dL (ref 30.0–36.0)
MCV: 90.4 fL (ref 80.0–100.0)
Platelets: 230 K/uL (ref 150–400)
RBC: 4.26 MIL/uL (ref 3.87–5.11)
RDW: 14.6 % (ref 11.5–15.5)
WBC: 8.4 K/uL (ref 4.0–10.5)
nRBC: 0 % (ref 0.0–0.2)

## 2024-08-18 LAB — BASIC METABOLIC PANEL WITH GFR
Anion gap: 5 (ref 5–15)
BUN: 11 mg/dL (ref 8–23)
CO2: 28 mmol/L (ref 22–32)
Calcium: 8.6 mg/dL — ABNORMAL LOW (ref 8.9–10.3)
Chloride: 105 mmol/L (ref 98–111)
Creatinine, Ser: 0.72 mg/dL (ref 0.44–1.00)
GFR, Estimated: >60 mL/min (ref >60)
Glucose, Bld: 84 mg/dL (ref 70–99)
Potassium: 3.8 mmol/L (ref 3.5–5.1)
Sodium: 138 mmol/L (ref 135–145)

## 2024-08-18 LAB — TROPONIN I (HIGH SENSITIVITY)
Troponin I (High Sensitivity): 5 ng/L (ref <18)
Troponin I (High Sensitivity): 6 ng/L (ref <18)

## 2024-08-18 MED ORDER — LISINOPRIL-HYDROCHLOROTHIAZIDE 20-25 MG PO TABS: 1.0000 | ORAL_TABLET | Freq: Every day | ORAL | 1 refills | Status: AC

## 2024-08-18 MED ORDER — LISINOPRIL 20 MG PO TABS
20.0000 mg | ORAL_TABLET | Freq: Once | ORAL | Status: AC
  Administered 2024-08-18: 20 mg via ORAL
  Filled 2024-08-18: qty 1

## 2024-08-18 MED ORDER — HYDROCHLOROTHIAZIDE 25 MG PO TABS
25.0000 mg | ORAL_TABLET | Freq: Once | ORAL | Status: AC
  Administered 2024-08-18: 25 mg via ORAL
  Filled 2024-08-18: qty 1

## 2024-08-18 MED ORDER — GABAPENTIN 300 MG PO CAPS: 300.0000 mg | ORAL_CAPSULE | Freq: Every day | ORAL | 1 refills | Status: AC

## 2024-08-18 NOTE — ED Provider Notes (Signed)
 [facility]    CSN: [id number] Arrival date & time: 08/18/24  1113      History   Chief Complaint Chief Complaint  Patient presents with   Chest Pain   Medication Refill    HPI Ashley Jacobs is a 68 y.o. female presents with daughter for evaluation of chest pain.  Patient and daughter declined translation line and daughter interprets for patient as she speaks Nepali.  Patient reports yesterday she developed a intermittent epigastric chest pain that radiates through towards her back.  It is associated with nausea and she had dizziness yesterday but states that has since resolved.  Also endorses a headache but denies visual changes, vomiting, syncope, palpitations, neck pain.  No smoking history.  She does have a history of hypertension, hyperlipidemia, and diabetes.  Patient has been out of her blood pressure medication for 3 weeks.  She does have a history of GERD and does state her pain somewhat improves when she takes her omeprazole  but does not go away completely.  Denies any known history of CAD or any family history of CAD.  She currently rates her chest pain as a 5 out of 10.  No aggravating or alleviating factors.  No other concerns at this time.   Chest Pain Associated symptoms: headache   Medication Refill   Past Medical History:  Diagnosis Date   Anginal pain 05/30/2012   Chronic pain    right hip   Hypertension    Leg DVT (deep venous thromboembolism), chronic (HCC) 2009; 06/02/2012   always left   Shortness of breath 05/30/2012   Tuberculosis ~ 1980   took medicine for it 6 months    Patient Active Problem List   Diagnosis Date Noted   Diabetes mellitus (HCC) 01/28/2024   Acute pain of right knee 07/16/2021   Elevated blood pressure reading in office with diagnosis of hypertension 07/16/2021   Chronic pain syndrome 10/13/2020   Gastroesophageal reflux disease without esophagitis 10/13/2020   Other insomnia 10/13/2020   Tension-type headache, not  intractable 10/13/2020   Neuropathy 09/24/2017   Vitamin D  deficiency 01/15/2017   Bronchiectasis (HCC) 06/03/2012   Acute thromboembolism of deep veins of lower extremity (HCC) 11/03/2008   Essential hypertension 08/22/2008   BACK PAIN, LUMBAR 10/29/2007   TB SKIN TEST, POSITIVE 07/21/2007    Past Surgical History:  Procedure Laterality Date   VENA CAVA FILTER PLACEMENT  06/02/12    OB History   No obstetric history on file.      Home Medications    Prior to Admission medications   Medication Sig Start Date End Date Taking? Authorizing Provider  atorvastatin  (LIPITOR) 20 MG tablet Take 1 tablet (20 mg total) by mouth daily. 01/28/24   Newlin, Enobong, MD  Blood Glucose Monitoring Suppl (ACCU-CHEK AVIVA PLUS) w/Device KIT 1 each by Does not apply route daily. 01/28/24   Newlin, Enobong, MD  cetirizine  (ZYRTEC ) 10 MG tablet Take 1 tablet (10 mg total) by mouth daily. Patient not taking: Reported on 01/28/2024 11/12/21   Newlin, Enobong, MD  DULoxetine  (CYMBALTA ) 60 MG capsule Take 1 capsule (60 mg total) by mouth daily for back pain 01/28/24   Newlin, Enobong, MD  gabapentin  (NEURONTIN ) 300 MG capsule Take 1 capsule (300 mg total) by mouth at bedtime. 01/28/24   Newlin, Enobong, MD  glucose blood (ACCU-CHEK AVIVA PLUS) test strip Use as instructed daily 01/28/24   Newlin, Enobong, MD  lisinopril -hydrochlorothiazide  (ZESTORETIC ) 20-25 MG tablet Take 1 tablet by mouth  daily. 01/28/24   Newlin, Enobong, MD  methocarbamol  (ROBAXIN ) 500 MG tablet Take 1 tablet (500 mg total) by mouth every 8 (eight) hours as needed for muscle spasms. Patient not taking: Reported on 01/28/2024 01/29/23   Newlin, Enobong, MD  omeprazole  (PRILOSEC) 20 MG capsule TAKE 1 CAPSULE(20 MG) BY MOUTH TWICE DAILY BEFORE A MEAL 07/27/24   Newlin, Enobong, MD    Family History History reviewed. No pertinent family history.  Social History Social History   Tobacco Use   Smoking status: Never   Smokeless tobacco: Never   Vaping Use   Vaping status: Never Used  Substance Use Topics   Alcohol use: No   Drug use: No     Allergies   Patient has no known allergies.   Review of Systems Review of Systems  Cardiovascular:  Positive for chest pain.  Neurological:  Positive for headaches.     Physical Exam Triage Vital Signs ED Triage Vitals  Encounter Vitals Group     BP 08/18/24 1128 (!) 170/99     Girls Systolic BP Percentile --      Girls Diastolic BP Percentile --      Boys Systolic BP Percentile --      Boys Diastolic BP Percentile --      Pulse Rate 08/18/24 1128 91     Resp 08/18/24 1128 17     Temp 08/18/24 1128 (!) 97.5 F (36.4 C)     Temp Source 08/18/24 1128 Oral     SpO2 08/18/24 1128 94 %     Weight --      Height --      Head Circumference --      Peak Flow --      Pain Score 08/18/24 1124 5     Pain Loc --      Pain Education --      Exclude from Growth Chart --    No data found.  Updated Vital Signs BP (!) 170/99   Pulse 91   Temp (!) 97.5 F (36.4 C) (Oral)   Resp 17   SpO2 94%   Visual Acuity Right Eye Distance:   Left Eye Distance:   Bilateral Distance:    Right Eye Near:   Left Eye Near:    Bilateral Near:     Physical Exam Vitals and nursing note reviewed.  Constitutional:      General: She is not in acute distress.    Appearance: Normal appearance. She is not ill-appearing.  HENT:     Head: Normocephalic and atraumatic.  Eyes:     Pupils: Pupils are equal, round, and reactive to light.  Cardiovascular:     Rate and Rhythm: Normal rate and regular rhythm.     Heart sounds: Normal heart sounds.  Pulmonary:     Effort: Pulmonary effort is normal.     Breath sounds: Normal breath sounds.  Chest:     Chest wall: No tenderness.  Skin:    General: Skin is warm and dry.  Neurological:     General: No focal deficit present.     Mental Status: She is alert and oriented to person, place, and time.  Psychiatric:        Mood and Affect: Mood  normal.        Behavior: Behavior normal.      UC Treatments / Results  Labs (all labs ordered are listed, but only abnormal results are displayed) Labs Reviewed - No data to display  EKG   Radiology No results found.  Procedures ED EKG  Date/Time: 08/18/2024 11:59 AM  Performed by: Loreda Myla SAUNDERS, NP Authorized by: Loreda Myla SAUNDERS, NP   ECG interpreted by ED Physician in the absence of a cardiologist: no   Previous ECG:    Previous ECG:  Compared to current Rate:    ECG rate:  82   ECG rate assessment: normal   Rhythm:    Rhythm: sinus rhythm   Ectopy:    Ectopy: none   QRS:    QRS axis:  Normal   QRS conduction: normal   ST segments:    ST segments:  Normal T waves:    T waves: normal    (including critical care time)  Medications Ordered in UC Medications - No data to display  Initial Impression / Assessment and Plan / UC Course  I have reviewed the triage vital signs and the nursing notes.  Pertinent labs & imaging results that were available during my care of the patient were reviewed by me and considered in my medical decision making (see chart for details).     I reviewed exam and symptoms with patient and daughter.  Patient presenting with epigastric chest pain and elevated blood pressure.  She has been off her medications for 3 weeks.  EKG shows sinus rhythm without any acute ST-T wave changes.  Discussed given patient's medical history, blood pressure, and symptoms I advise she go to the emergency room for further evaluation and workup.  Family opted for EMS transfer.  EMS called and patient was transferred to the hospital. Final Clinical Impressions(s) / UC DiagnosesBlood pressure reading been off medications x 3 weeks as well as epigastric   Final diagnoses:  Chest pain, unspecified type  Uncontrolled hypertension     Discharge Instructions      Patient taken to ER via EMS per family request     ED Prescriptions   None    PDMP not  reviewed this encounter.   Loreda Myla SAUNDERS, NP 08/18/24 1200

## 2024-08-18 NOTE — ED Triage Notes (Signed)
 RN able to assess patient with daughter at bedside. Patient endorses epigastric pain that relieves with gas medicine; CP, HA and dizziness started a week after BP meds ran out. Patient is more relaxed and comfortable now that family is at bedside.

## 2024-08-18 NOTE — Discharge Instructions (Addendum)
 Patient taken to ER via EMS per family request

## 2024-08-18 NOTE — ED Provider Notes (Signed)
 [place] [facility] AT [facility] Provider Note   CSN: [id number] Arrival date & time: 08/18/24  1316     Patient presents with: Hypertension   Ashley Jacobs is a 68 y.o. female.  She is brought in by ambulance from urgent care.  She is Nepali speaking and her son is translating at their request.  She has a history of chronic hypertension and has been out of her lisinopril  hydrochlorothiazide  for about 3 weeks.  She does not have any refills left and does not have an appointment to see her PCP for a few weeks.  She has been complaining of headaches and pain in her chest.  Her son says these are old complaints for her, often related to fatty foods or when she runs out of her blood pressure medication.  No known history of cardiac disease.   The history is provided by the patient.  Hypertension This is a recurrent problem. The problem occurs constantly. The problem has not changed since onset.Associated symptoms include chest pain and headaches. Nothing relieves the symptoms. She has tried nothing for the symptoms. The treatment provided no relief.       Prior to Admission medications   Medication Sig Start Date End Date Taking? Authorizing Provider  atorvastatin  (LIPITOR) 20 MG tablet Take 1 tablet (20 mg total) by mouth daily. 01/28/24   Newlin, Enobong, MD  Blood Glucose Monitoring Suppl (ACCU-CHEK AVIVA PLUS) w/Device KIT 1 each by Does not apply route daily. 01/28/24   Newlin, Enobong, MD  cetirizine  (ZYRTEC ) 10 MG tablet Take 1 tablet (10 mg total) by mouth daily. Patient not taking: Reported on 01/28/2024 11/12/21   Newlin, Enobong, MD  DULoxetine  (CYMBALTA ) 60 MG capsule Take 1 capsule (60 mg total) by mouth daily for back pain 01/28/24   Newlin, Enobong, MD  gabapentin  (NEURONTIN ) 300 MG capsule Take 1 capsule (300 mg total) by mouth at bedtime. 01/28/24   Newlin, Enobong, MD  glucose blood (ACCU-CHEK AVIVA PLUS) test strip Use as instructed daily 01/28/24    Newlin, Enobong, MD  lisinopril -hydrochlorothiazide  (ZESTORETIC ) 20-25 MG tablet Take 1 tablet by mouth daily. 01/28/24   Newlin, Enobong, MD  methocarbamol  (ROBAXIN ) 500 MG tablet Take 1 tablet (500 mg total) by mouth every 8 (eight) hours as needed for muscle spasms. Patient not taking: Reported on 01/28/2024 01/29/23   Newlin, Enobong, MD  omeprazole  (PRILOSEC) 20 MG capsule TAKE 1 CAPSULE(20 MG) BY MOUTH TWICE DAILY BEFORE A MEAL 07/27/24   Newlin, Enobong, MD    Allergies: Patient has no known allergies.    Review of Systems  Constitutional:  Negative for fever.  Eyes:  Negative for visual disturbance.  Cardiovascular:  Positive for chest pain.  Gastrointestinal:  Positive for nausea.  Neurological:  Positive for headaches.    Updated Vital Signs BP (!) 156/111 (BP Location: Left Arm)   Pulse 84   Temp 97.9 F (36.6 C)   Resp 18   Ht 5' 1 (1.549 m)   Wt 72.6 kg   SpO2 96%   BMI 30.23 kg/m   Physical Exam Vitals and nursing note reviewed.  Constitutional:      General: She is not in acute distress.    Appearance: Normal appearance. She is well-developed.  HENT:     Head: Normocephalic and atraumatic.  Eyes:     Conjunctiva/sclera: Conjunctivae normal.  Cardiovascular:     Rate and Rhythm: Normal rate and regular rhythm.     Heart sounds: No murmur  heard. Pulmonary:     Effort: Pulmonary effort is normal. No respiratory distress.     Breath sounds: Normal breath sounds.  Abdominal:     Palpations: Abdomen is soft.     Tenderness: There is no abdominal tenderness. There is no guarding or rebound.  Musculoskeletal:     Cervical back: Neck supple.     Right lower leg: No edema.     Left lower leg: No edema.  Skin:    General: Skin is warm and dry.     Capillary Refill: Capillary refill takes less than 2 seconds.  Neurological:     General: No focal deficit present.     Mental Status: She is alert.     Gait: Gait normal.     (all labs ordered are listed, but  only abnormal results are displayed) Labs Reviewed  BASIC METABOLIC PANEL WITH GFR - Abnormal; Notable for the following components:      Result Value   Calcium  8.6 (*)    All other components within normal limits  CBC  TROPONIN I (HIGH SENSITIVITY)  TROPONIN I (HIGH SENSITIVITY)    EKG: EKG Interpretation Date/Time:  Thursday August 18 2024 13:40:52 EST Ventricular Rate:  83 PR Interval:  150 QRS Duration:  82 QT Interval:  374 QTC Calculation: 439 R Axis:   35  Text Interpretation: Normal sinus rhythm Anterior infarct , age undetermined Abnormal ECG When compared with ECG of 18-Aug-2024 11:30, No significant change since last tracing Confirmed by Towana Sharper [phone]) on 08/18/2024 5:15:19 PM  Radiology: DG Chest 2 View Result Date: 08/18/2024 CLINICAL DATA:  chest pain EXAM: CHEST - 2 VIEW COMPARISON:  July 03, 2014, November 21, 2022 FINDINGS: Similar right-sided volume loss with biapical scarring, more so on the right than the left, with and biapical pleural thickening. No new airspace consolidation, pleural effusion, or pneumothorax. No cardiomegaly. Tortuous aorta with aortic atherosclerosis. No acute fracture or destructive lesions. Multilevel thoracic osteophytosis. IMPRESSION: No acute cardiopulmonary abnormality. Electronically Signed   By: Rogelia Myers M.D.   On: 08/18/2024 15:41     Procedures   Medications Ordered in the ED  lisinopril  (ZESTRIL ) tablet 20 mg (has no administration in time range)  hydrochlorothiazide  (HYDRODIURIL ) tablet 25 mg (has no administration in time range)    Clinical Course as of 08/19/24 1007  Thu Aug 18, 2024  1729 Chest x-ray interpreted by me as no acute infiltrate.  Awaiting radiology reading. [MB]  1838 Blood pressure improved to 146/78.  She is feeling improved.  No evidence of cardiac injury or other significant abnormalities on lab work and imaging.  They are comfortable plan for discharge and I have sent a prescription for  lisinopril  hydrochlorothiazide  and her gabapentin  to the pharmacy.  They have a follow-up PCP appointment in a few weeks.  Return instructions discussed [MB]    Clinical Course User Index [MB] Towana Sharper BROCKS, MD                                 Medical Decision Making Amount and/or Complexity of Data Reviewed Labs: ordered. Radiology: ordered.  Risk Prescription drug management.   This patient complains of elevated blood pressure headache chest pain; this involves an extensive number of treatment Options and is a complaint that carries with it a high risk of complications and morbidity. The differential includes hypertension, hypertensive emergency, endorgan damage  I ordered, reviewed and interpreted labs,  which included CBC normal chemistries normal troponin flat I ordered medication patient's home blood pressure medicine and reviewed PMP when indicated. I ordered imaging studies which included chest x-ray and I independently    visualized and interpreted imaging which showed no acute findings Additional history obtained from patient's son Previous records obtained and reviewed in epic including outpatient PCP notes Cardiac monitoring reviewed, sinus rhythm Social determinants considered, no significant barriers Critical Interventions: None  After the interventions stated above, I reevaluated the patient and found patient to be symptomatically improved with improvement in her blood pressure Admission and further testing considered, no indications for admission.  Will restart blood pressure medicine and gabapentin  and prescription sent to pharmacy.  Recommended close follow-up with PCP.  Return instructions discussed      Final diagnoses:  Resistant hypertension  Generalized headache  Nonspecific chest pain    ED Discharge Orders          Ordered    gabapentin  (NEURONTIN ) 300 MG capsule  Daily at bedtime        08/18/24 1838    lisinopril -hydrochlorothiazide   (ZESTORETIC ) 20-25 MG tablet  Daily        08/18/24 1838               Towana Ozell BROCKS, MD 08/19/24 1008

## 2024-08-18 NOTE — ED Triage Notes (Addendum)
 Patient BIB [facility] sent from UC for HTN, went to UC for intermittent epigastric pain, headache, and dizziness with no BP meds x 2 weeks, found to be hypertensive, sent here for eval.  BP 180/110 HR 82 Patient appears uncomfortable in triage, language barrier prevents full assessment at this time.

## 2024-08-18 NOTE — Discharge Instructions (Addendum)
 Please restart your blood pressure medication.  Follow-up with your primary care doctor.  Return if any worsening or concerning symptoms.

## 2024-08-18 NOTE — ED Triage Notes (Addendum)
 Pt has been out of bp meds for 2 weeks and needs a refill. Pt's daughter states her brother is going to make PCP appt for pt and she missed her last appt due to them leaving the state to visit family. Pt c/o HA upon waking, but resolves when she starts walking around. Pt denies dizziness vision changes. Pt c/o non radiating midsternal chest burning started yesterday.

## 2024-08-18 NOTE — ED Notes (Signed)
 Patient is being discharged from the Urgent Care and sent to the Emergency Department via [facility] . Per Myla Bold, NP, patient is in need of higher level of care due to needing further work up and testing. Patient is aware and verbalizes understanding of plan of care.  Vitals:   08/18/24 1128  BP: (!) 170/99  Pulse: 91  Resp: 17  Temp: (!) 97.5 F (36.4 C)  SpO2: 94%

## 2024-08-31 ENCOUNTER — Encounter: Payer: Self-pay | Admitting: Family Medicine

## 2024-08-31 ENCOUNTER — Ambulatory Visit: Attending: Family Medicine | Admitting: Family Medicine

## 2024-08-31 VITALS — BP 107/73 | HR 107 | Temp 98.0°F | Ht 61.0 in | Wt 154.6 lb

## 2024-08-31 DIAGNOSIS — I10 Essential (primary) hypertension: Secondary | ICD-10-CM

## 2024-08-31 DIAGNOSIS — Z79899 Other long term (current) drug therapy: Secondary | ICD-10-CM

## 2024-08-31 DIAGNOSIS — E1169 Type 2 diabetes mellitus with other specified complication: Secondary | ICD-10-CM

## 2024-08-31 DIAGNOSIS — Z1211 Encounter for screening for malignant neoplasm of colon: Secondary | ICD-10-CM

## 2024-08-31 DIAGNOSIS — K219 Gastro-esophageal reflux disease without esophagitis: Secondary | ICD-10-CM

## 2024-08-31 DIAGNOSIS — M5442 Lumbago with sciatica, left side: Secondary | ICD-10-CM

## 2024-08-31 DIAGNOSIS — E1159 Type 2 diabetes mellitus with other circulatory complications: Secondary | ICD-10-CM | POA: Diagnosis not present

## 2024-08-31 DIAGNOSIS — Z23 Encounter for immunization: Secondary | ICD-10-CM | POA: Diagnosis not present

## 2024-08-31 DIAGNOSIS — M5441 Lumbago with sciatica, right side: Secondary | ICD-10-CM

## 2024-08-31 DIAGNOSIS — E2839 Other primary ovarian failure: Secondary | ICD-10-CM

## 2024-08-31 DIAGNOSIS — G8929 Other chronic pain: Secondary | ICD-10-CM

## 2024-08-31 LAB — POCT GLYCOSYLATED HEMOGLOBIN (HGB A1C): HbA1c, POC (controlled diabetic range): 6.7 % (ref 0.0–7.0)

## 2024-08-31 MED ORDER — DULOXETINE HCL 60 MG PO CPEP: 60.0000 mg | ORAL_CAPSULE | Freq: Every day | ORAL | 1 refills | Status: AC

## 2024-08-31 MED ORDER — ATORVASTATIN CALCIUM 20 MG PO TABS: 20.0000 mg | ORAL_TABLET | Freq: Every day | ORAL | 1 refills | Status: AC

## 2024-08-31 MED ORDER — GABAPENTIN 300 MG PO CAPS: 300.0000 mg | ORAL_CAPSULE | Freq: Every day | ORAL | 1 refills | Status: AC

## 2024-08-31 NOTE — Patient Instructions (Signed)
 VISIT SUMMARY:  You had a follow-up appointment today to manage your medications and overall health. We discussed your recent emergency room visit for high blood pressure and reviewed your current medications. Your diabetes is well controlled, and we talked about preventive care and screenings.  YOUR PLAN:  -ADULT WELLNESS VISIT: This visit focused on preventive care and screenings to maintain your overall health. We ordered a mammogram for breast cancer screening, referred you to a specialist for colon cancer screening, ordered a bone density test, and administered a flu shot.  -TYPE 2 DIABETES MELLITUS: Type 2 diabetes is a condition where your body doesn't use insulin properly. Your diabetes is well controlled with a recent A1c of 6.7, managed through diet and lifestyle. We ordered a urine microalbumin test to check your kidney function.  -ESSENTIAL HYPERTENSION: Hypertension is high blood pressure. Your blood pressure is well controlled with your current medication, lisinopril -hydrochlorothiazide . Continue taking 20-25 MG orally daily.  -CHRONIC MIDLINE LOW BACK PAIN WITH BILATERAL SCIATICA: This is ongoing pain in your lower back that radiates to both legs. It is managed with gabapentin . Continue taking 300 MG orally at bedtime.  -GASTROESOPHAGEAL REFLUX DISEASE: GERD is a condition where stomach acid frequently flows back into the tube connecting your mouth and stomach. It is managed with omeprazole . Continue taking 20 MG orally twice a day.  INSTRUCTIONS:  Please follow up with the specialist for your colon cancer screening. Continue taking your medications as prescribed. We will notify you with the results of your urine microalbumin test, mammogram, and bone density test once they are available.

## 2024-08-31 NOTE — Progress Notes (Signed)
 Subjective:  Patient ID: Ashley HILEY, female    DOB: 1955/12/16  Age: 68 y.o. MRN: [id number]  CC: Medical Management of Chronic Issues     Discussed the use of AI scribe software for clinical note transcription with the patient, who gave verbal consent to proceed.  History of Present Illness Ashley Jacobs is a 68 year old female with a history of hypertension, GERD, chronic pain  who presents for medication management and follow-up. She is accompanied by her daughter.  She recently went to the emergency room for elevated blood pressure after running out of her medications. She is now taking lisinopril /hydrochlorothiazide  again.  Her most recent A1c was 6.7. She manages diabetes with diet and lifestyle and is not on diabetes medication.  She is not taking atorvastatin  due to running out. She takes a PPI for acid reflux and uses gabapentin  and Cymbalta  for back pain.    Past Medical History:  Diagnosis Date   Anginal pain 05/30/2012   Chronic pain    right hip   Hypertension    Leg DVT (deep venous thromboembolism), chronic (HCC) 2009; 06/02/2012   always left   Shortness of breath 05/30/2012   Tuberculosis ~ 1980   took medicine for it 6 months    Past Surgical History:  Procedure Laterality Date   VENA CAVA FILTER PLACEMENT  06/02/12    No family history on file.  Social History   Socioeconomic History   Marital status: Married    Spouse name: Not on file   Number of children: Not on file   Years of education: Not on file   Highest education level: Not on file  Occupational History   Not on file  Tobacco Use   Smoking status: Never   Smokeless tobacco: Never  Vaping Use   Vaping status: Never Used  Substance and Sexual Activity   Alcohol use: No   Drug use: No   Sexual activity: Never  Other Topics Concern   Not on file  Social History Narrative   Not on file   Social Drivers of Health   Tobacco Use: Low Risk (08/18/2024)   Patient History     Smoking Tobacco Use: Never    Smokeless Tobacco Use: Never    Passive Exposure: Not on file  Financial Resource Strain: Not on file  Food Insecurity: Not on file  Transportation Needs: Not on file  Physical Activity: Not on file  Stress: Not on file  Social Connections: Not on file  Depression (PHQ2-9): Low Risk (01/28/2024)   Depression (PHQ2-9)    PHQ-2 Score: 4  Alcohol Screen: Not on file  Housing: Not on file  Utilities: Not on file  Health Literacy: Not on file    Allergies[1]  Outpatient Medications Prior to Visit  Medication Sig Dispense Refill   lisinopril -hydrochlorothiazide  (ZESTORETIC ) 20-25 MG tablet Take 1 tablet by mouth daily. 90 tablet 1   omeprazole  (PRILOSEC) 20 MG capsule TAKE 1 CAPSULE(20 MG) BY MOUTH TWICE DAILY BEFORE A MEAL 180 capsule 0   gabapentin  (NEURONTIN ) 300 MG capsule Take 1 capsule (300 mg total) by mouth at bedtime. 30 capsule 1   Blood Glucose Monitoring Suppl (ACCU-CHEK AVIVA PLUS) w/Device KIT 1 each by Does not apply route daily. (Patient not taking: Reported on 08/31/2024) 1 kit 0   cetirizine  (ZYRTEC ) 10 MG tablet Take 1 tablet (10 mg total) by mouth daily. (Patient not taking: Reported on 08/31/2024) 30 tablet 1   glucose blood (ACCU-CHEK AVIVA  PLUS) test strip Use as instructed daily (Patient not taking: Reported on 08/31/2024) 100 each 12   methocarbamol  (ROBAXIN ) 500 MG tablet Take 1 tablet (500 mg total) by mouth every 8 (eight) hours as needed for muscle spasms. (Patient not taking: Reported on 08/31/2024) 180 tablet 1   atorvastatin  (LIPITOR) 20 MG tablet Take 1 tablet (20 mg total) by mouth daily. (Patient not taking: Reported on 08/31/2024) 90 tablet 1   DULoxetine  (CYMBALTA ) 60 MG capsule Take 1 capsule (60 mg total) by mouth daily for back pain (Patient not taking: Reported on 08/31/2024) 90 capsule 1   No facility-administered medications prior to visit.     ROS Review of Systems  Constitutional:  Negative for activity  change and appetite change.  HENT:  Negative for sinus pressure and sore throat.   Respiratory:  Negative for chest tightness, shortness of breath and wheezing.   Cardiovascular:  Negative for chest pain and palpitations.  Gastrointestinal:  Negative for abdominal distention, abdominal pain and constipation.  Genitourinary: Negative.   Musculoskeletal: Negative.   Psychiatric/Behavioral:  Negative for behavioral problems and dysphoric mood.     Objective:  BP 107/73   Pulse (!) 107   Temp 98 F (36.7 C) (Oral)   Ht 5' 1 (1.549 m)   Wt 154 lb 9.6 oz (70.1 kg)   SpO2 97%   BMI 29.21 kg/m      08/31/2024    3:42 PM 08/18/2024    6:00 PM 08/18/2024    2:23 PM  BP/Weight  Systolic BP 107 164   Diastolic BP 73 94   Wt. (Lbs) 154.6  160  BMI 29.21 kg/m2  30.23 kg/m2      Physical Exam Constitutional:      Appearance: She is well-developed.  Cardiovascular:     Rate and Rhythm: Tachycardia present.     Heart sounds: Normal heart sounds. No murmur heard. Pulmonary:     Effort: Pulmonary effort is normal.     Breath sounds: Normal breath sounds. No wheezing or rales.  Chest:     Chest wall: No tenderness.  Abdominal:     General: Bowel sounds are normal. There is no distension.     Palpations: Abdomen is soft. There is no mass.     Tenderness: There is no abdominal tenderness.  Musculoskeletal:        General: Normal range of motion.     Right lower leg: No edema.     Left lower leg: No edema.  Neurological:     Mental Status: She is alert and oriented to person, place, and time.  Psychiatric:        Mood and Affect: Mood normal.        Latest Ref Rng & Units 08/18/2024    3:19 PM 01/26/2024    4:26 PM 11/21/2022    9:24 PM  CMP  Glucose 70 - 99 mg/dL 84  764  850   BUN 8 - 23 mg/dL 11  11  11    Creatinine 0.44 - 1.00 mg/dL 9.27  9.07  9.19   Sodium 135 - 145 mmol/L 138  136  136   Potassium 3.5 - 5.1 mmol/L 3.8  3.5  3.6   Chloride 98 - 111 mmol/L 105  100   99   CO2 22 - 32 mmol/L 28  25    Calcium  8.9 - 10.3 mg/dL 8.6  8.9      Lipid Panel     Component Value Date/Time  CHOL 143 11/12/2021 1626   TRIG 109 11/12/2021 1626   HDL 44 11/12/2021 1626   CHOLHDL 3.5 02/20/2021 1133   CHOLHDL 4.0 10/17/2016 0836   VLDL 24 11/16/2015 1051   LDLCALC 79 11/12/2021 1626    CBC    Component Value Date/Time   WBC 8.4 08/18/2024 1519   RBC 4.26 08/18/2024 1519   HGB 12.4 08/18/2024 1519   HGB 11.8 02/20/2021 1133   HCT 38.5 08/18/2024 1519   HCT 36.2 02/20/2021 1133   PLT 230 08/18/2024 1519   PLT 215 02/20/2021 1133   MCV 90.4 08/18/2024 1519   MCV 92 02/20/2021 1133   MCH 29.1 08/18/2024 1519   MCHC 32.2 08/18/2024 1519   RDW 14.6 08/18/2024 1519   RDW 13.9 02/20/2021 1133   LYMPHSABS 3.1 11/21/2022 2012   LYMPHSABS 1.9 02/20/2021 1133   MONOABS 1.2 (H) 11/21/2022 2012   EOSABS 0.1 11/21/2022 2012   EOSABS 0.1 02/20/2021 1133   BASOSABS 0.0 11/21/2022 2012   BASOSABS 0.0 02/20/2021 1133    Lab Results  Component Value Date   HGBA1C 6.7 08/31/2024   Lab Results  Component Value Date   HGBA1C 6.7 08/31/2024   HGBA1C 6.7 (H) 01/26/2024   HGBA1C 6.1 06/19/2022        Assessment & Plan  Type 2 diabetes mellitus Diabetes is well controlled with an A1c of 6.7. Managed through diet and lifestyle without medication. - Ordered urine microalbumin test to check kidney function. -Counseled on Diabetic diet, the healthy plate, 849 minutes of moderate intensity exercise/week Blood sugar logs with fasting goals of 80-120 mg/dl, random of less than 819 and in the event of sugars less than 60 mg/dl or greater than 599 mg/dl encouraged to notify the clinic. Advised on the need for annual eye exams, annual foot exams, Pneumonia vaccine.   On statin due to increased risk of cardiovascular disease She has not been adherent with her statin due to running out - Atorvastatin  has been refilled - Low-cholesterol diet   Essential  hypertension associated with type 2 diabetes mellitus Blood pressure is well controlled with lisinopril -hydrochlorothiazide . Recent emergency room visit due to running out of medication. - Continue lisinopril -hydrochlorothiazide  20-25 MG oral daily. -Counseled on blood pressure goal of less than 130/80, low-sodium, DASH diet, medication compliance, 150 minutes of moderate intensity exercise per week. Discussed medication compliance, adverse effects.   Chronic midline low back pain with bilateral sciatica Stable Chronic low back pain managed with gabapentin . - Continue gabapentin  300 MG oral at bedtime.  Gastroesophageal reflux disease GERD is managed with omeprazole . - Continue omeprazole  20 MG oral bid.   Healthcare maintenance - Previously ordered mammogram for breast cancer screening and patient is to complete this Screening for colon cancer- Referred to specialist for colon cancer screening. Estrogen deficiency- Ordered bone density test. Encounter for vaccine administration- Administered flu shot and PCV 20  Meds ordered this encounter  Medications   atorvastatin  (LIPITOR) 20 MG tablet    Sig: Take 1 tablet (20 mg total) by mouth daily.    Dispense:  90 tablet    Refill:  1   DULoxetine  (CYMBALTA ) 60 MG capsule    Sig: Take 1 capsule (60 mg total) by mouth daily for back pain    Dispense:  90 capsule    Refill:  1   gabapentin  (NEURONTIN ) 300 MG capsule    Sig: Take 1 capsule (300 mg total) by mouth at bedtime.    Dispense:  30 capsule  Refill:  1    Follow-up: Return in about 6 months (around 03/01/2025) for Chronic medical conditions.       Corrina Sabin, MD, FAAFP. [facility] [place], [place] [phone]   08/31/2024, 4:16 PM    [1] No Known Allergies

## 2024-09-02 LAB — MICROALBUMIN / CREATININE URINE RATIO
Creatinine, Urine: 165.2 mg/dL
Microalb/Creat Ratio: 9 mg/g{creat} (ref 0–29)
Microalbumin, Urine: 15.3 ug/mL

## 2024-09-05 ENCOUNTER — Ambulatory Visit: Payer: Self-pay | Admitting: Family Medicine

## 2024-10-19 ENCOUNTER — Encounter: Payer: Self-pay | Admitting: Gastroenterology

## 2025-03-01 ENCOUNTER — Ambulatory Visit: Payer: Self-pay | Admitting: Family Medicine
